# Patient Record
Sex: Male | Born: 1945 | Race: White | Hispanic: No | Marital: Married | Smoking: Former smoker
Health system: Southern US, Community
[De-identification: ages and names within clinical notes are randomized; demographics above are authoritative.]

## PROBLEM LIST (undated history)

## (undated) DIAGNOSIS — M1990 Unspecified osteoarthritis, unspecified site: Secondary | ICD-10-CM

## (undated) DIAGNOSIS — G4730 Sleep apnea, unspecified: Secondary | ICD-10-CM

## (undated) DIAGNOSIS — I499 Cardiac arrhythmia, unspecified: Secondary | ICD-10-CM

## (undated) DIAGNOSIS — D75839 Thrombocytosis, unspecified: Secondary | ICD-10-CM

## (undated) DIAGNOSIS — E785 Hyperlipidemia, unspecified: Secondary | ICD-10-CM

## (undated) DIAGNOSIS — T7840XA Allergy, unspecified, initial encounter: Secondary | ICD-10-CM

## (undated) DIAGNOSIS — R7302 Impaired glucose tolerance (oral): Secondary | ICD-10-CM

## (undated) DIAGNOSIS — E119 Type 2 diabetes mellitus without complications: Secondary | ICD-10-CM

## (undated) DIAGNOSIS — E669 Obesity, unspecified: Secondary | ICD-10-CM

## (undated) DIAGNOSIS — L570 Actinic keratosis: Secondary | ICD-10-CM

## (undated) DIAGNOSIS — D473 Essential (hemorrhagic) thrombocythemia: Secondary | ICD-10-CM

## (undated) HISTORY — DX: Thrombocytosis, unspecified: D75.839

## (undated) HISTORY — PX: NASAL SEPTUM SURGERY: SHX37

## (undated) HISTORY — PX: LITHOTRIPSY: SUR834

## (undated) HISTORY — DX: Essential (hemorrhagic) thrombocythemia: D47.3

## (undated) HISTORY — DX: Sleep apnea, unspecified: G47.30

## (undated) HISTORY — DX: Obesity, unspecified: E66.9

## (undated) HISTORY — DX: Type 2 diabetes mellitus without complications: E11.9

## (undated) HISTORY — DX: Impaired glucose tolerance (oral): R73.02

## (undated) HISTORY — PX: EYE SURGERY: SHX253

## (undated) HISTORY — DX: Hyperlipidemia, unspecified: E78.5

## (undated) HISTORY — PX: CATARACT EXTRACTION, BILATERAL: SHX1313

## (undated) HISTORY — PX: TONSILLECTOMY: SUR1361

## (undated) HISTORY — DX: Allergy, unspecified, initial encounter: T78.40XA

## (undated) HISTORY — DX: Actinic keratosis: L57.0

---

## 2005-06-25 ENCOUNTER — Emergency Department: Payer: Self-pay | Admitting: Emergency Medicine

## 2005-06-27 ENCOUNTER — Ambulatory Visit: Payer: Self-pay | Admitting: Urology

## 2006-07-14 ENCOUNTER — Ambulatory Visit: Payer: Self-pay | Admitting: Urology

## 2006-12-16 ENCOUNTER — Ambulatory Visit: Payer: Self-pay | Admitting: Otolaryngology

## 2006-12-22 ENCOUNTER — Ambulatory Visit: Payer: Self-pay | Admitting: Unknown Physician Specialty

## 2007-01-20 ENCOUNTER — Ambulatory Visit: Payer: Self-pay | Admitting: Otolaryngology

## 2007-04-09 ENCOUNTER — Ambulatory Visit: Payer: Self-pay | Admitting: Internal Medicine

## 2007-04-16 ENCOUNTER — Ambulatory Visit: Payer: Self-pay

## 2007-05-25 ENCOUNTER — Ambulatory Visit: Payer: Self-pay | Admitting: Urology

## 2007-05-27 ENCOUNTER — Ambulatory Visit: Payer: Self-pay | Admitting: Internal Medicine

## 2007-08-11 ENCOUNTER — Ambulatory Visit: Payer: Self-pay | Admitting: Ophthalmology

## 2007-08-11 ENCOUNTER — Other Ambulatory Visit: Payer: Self-pay

## 2007-08-12 ENCOUNTER — Ambulatory Visit: Payer: Self-pay | Admitting: Internal Medicine

## 2007-08-12 LAB — CONVERTED CEMR LAB: Free T4: 0.7 ng/dL (ref 0.6–1.6)

## 2007-09-15 ENCOUNTER — Ambulatory Visit: Payer: Self-pay | Admitting: Ophthalmology

## 2007-12-10 DIAGNOSIS — E119 Type 2 diabetes mellitus without complications: Secondary | ICD-10-CM

## 2007-12-10 HISTORY — DX: Type 2 diabetes mellitus without complications: E11.9

## 2008-08-29 ENCOUNTER — Ambulatory Visit: Payer: Self-pay | Admitting: Urology

## 2009-07-31 ENCOUNTER — Ambulatory Visit: Payer: Self-pay | Admitting: Urology

## 2010-01-18 ENCOUNTER — Encounter: Payer: Self-pay | Admitting: Cardiovascular Disease

## 2010-04-19 ENCOUNTER — Encounter: Payer: Self-pay | Admitting: Cardiovascular Disease

## 2010-05-25 ENCOUNTER — Ambulatory Visit: Payer: Self-pay | Admitting: Cardiovascular Disease

## 2010-05-27 DIAGNOSIS — I4949 Other premature depolarization: Secondary | ICD-10-CM | POA: Insufficient documentation

## 2010-05-27 DIAGNOSIS — R001 Bradycardia, unspecified: Secondary | ICD-10-CM | POA: Insufficient documentation

## 2010-05-27 DIAGNOSIS — I471 Supraventricular tachycardia: Secondary | ICD-10-CM | POA: Insufficient documentation

## 2010-07-10 ENCOUNTER — Emergency Department: Payer: Self-pay | Admitting: Unknown Physician Specialty

## 2010-07-12 ENCOUNTER — Ambulatory Visit: Payer: Self-pay | Admitting: Urology

## 2010-07-19 ENCOUNTER — Ambulatory Visit: Payer: Self-pay | Admitting: Urology

## 2010-07-30 ENCOUNTER — Ambulatory Visit: Payer: Self-pay | Admitting: Urology

## 2010-08-20 ENCOUNTER — Ambulatory Visit: Payer: Self-pay | Admitting: Urology

## 2011-01-08 NOTE — Procedures (Signed)
Summary: Holter and Event  Holter and Event   Imported By: Harlon Flor 05/30/2010 08:33:52  _____________________________________________________________________  External Attachment:    Type:   Image     Comment:   External Document

## 2011-01-08 NOTE — Assessment & Plan Note (Signed)
Summary: HOLTER MONITOR IRREG.   Visit Type:  Initial Consult Primary Provider:  Wonda Cheng  CC:  HolterMonitor Irregular.  History of Present Illness: Richard Hardy is a pleasant, 65 year old male patient of Dr. Marguerite Hardy, with a history of obesity, hyperlipidemia, glucose intolerance, obstructive sleep apnea on CPAP and a strong family history of coronary artery disease who returns for routine followup. 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   overall he states that he has been doing well. He denies any significant lightheadedness or dizziness. He does report occasional heart fluttering and he had a Holter monitor for 24 hours and presents today to discuss the results.  His monitor shows baseline heart rate at rest in the 50s, with exertion typically 60 to 90 beats per minute.there are frequent PVCs. Frequent PACs. He is not symptomatic with this ectopy. He also has short runs SVT, total of 14 runs a longest of which was 67 beats at a rate of 125 beats per minute. Again he is not symptomatic with this supraventricular tachycardia.   Myoview in 2009 which showed an EF of 58% with no evidence of ischemia or infarct.    ABIs which were normal at 1.3 on the right and 1.2 on the left.     Current Medications (verified): 1)  Simvastatin 40 Mg Tabs (Simvastatin) .... Take One Tablet By Mouth Daily At Bedtime 2)  Flonase 50 Mcg/act Susp (Fluticasone Propionate) .... Before Bed 3)  Juice Plus Fibre  Liqd (Nutritional Supplements) .... 2 Tabs Two Times A Day 4)  Aspirin 81 Mg  Tabs (Aspirin) .... Take 1 Tablet By Mouth Once A Day 5)  Glucosamine-Chondroitin  Caps (Glucosamine-Chondroit-Vit C-Mn) .... Take 2 Tablet By Mouth Once A Day 6)  Flax   Oil (Flaxseed (Linseed)) .... Take 1 Tablet By Mouth Once A Day 7)  Fish Oil   Oil (Fish Oil) .... Take 1 Tablet By Mouth Once A Day 8)  Zyrtec-D Allergy & Congestion 5-120 Mg Xr12h-Tab (Cetirizine-Pseudoephedrine) .... Take 1 Tablet By Mouth Once A  Day As Needed For Allergy Symptoms 9)  Eql Coq10 200 Mg Caps (Coenzyme Q10) .... Take 1 Tablet By Mouth Once A Day 10)  Prevacid 24hr 15 Mg Cpdr (Lansoprazole) .... Take 1 Tablet By Mouth Once A Day As Need For Indigestion. 11)  Bi Pap .... At Bedtime  Allergies (verified): 1)  ! Sulfa  Past History:  Past Medical History: Last updated: 11/01/2009  1. Obesity.  2. Glucose intolerance.  3. Hyperlipidemia.  4. Sleep apnea on BiPAP.  Family History: Last updated: 09-06-2009  Mother died at age 31 from cancer.  Father died at 32   from a heart attack.  He has one brother who just underwent bypass  surgery.      Social History: Last updated: 09/06/09  He is married with four children.  Lives in [place].   Had a history of tobacco 1/2 pack per day x4 years.  Quit in 1965.  Very  occasional alcohol.   Review of Systems  The patient denies fever, weight loss, weight gain, vision loss, decreased hearing, hoarseness, chest pain, syncope, dyspnea on exertion, peripheral edema, prolonged cough, abdominal pain, incontinence, muscle weakness, depression, and enlarged lymph nodes.         Palpitations, fluttering  Vital Signs:  Patient profile:   65 year old male Height:      70 inches Weight:      224 pounds BMI:     32.26 Pulse rate:  53 / minute BP sitting:   124 / 72  (right arm) Cuff size:   regular  Physical Exam  General:  Well developed, well nourished, in no acute distress. Head:  normocephalic and atraumatic Neck:  Neck supple, no JVD. No masses, thyromegaly or abnormal cervical nodes. Lungs:  Clear bilaterally to auscultation and percussion. Heart:  Non-displaced PMI, chest non-tender; regular rate and rhythm, S1, S2 without murmurs, rubs or gallops. Carotid upstroke normal, no bruit.  Pedals normal pulses. No edema, no varicosities. Abdomen:  Bowel sounds positive; abdomen soft and non-tender without masses Msk:  Back normal, normal gait. Muscle strength and  tone normal. Pulses:  pulses normal in all 4 extremities Extremities:  No clubbing or cyanosis. Neurologic:  Alert and oriented x 3. Skin:  Intact without lesions or rashes. Psych:  Normal affect.    EKG  Procedure date:  05/25/2010  Findings:      normal sinus rhythm with rate 53 beats per minute, no significant ST or T wave changes.  Impression & Recommendations:  Problem # 1:  BRADYCARDIA (ICD-427.89) the Holter shows bradycardia, PVCs, APCs and short runs of SVT. As he is asymptomatic, we will continue to monitor him for now. We cannot use rate controlling agents for his tachycardia arrhythmias due to his bradycardia. If he begins to have symptoms of dizziness or lightheadedness, we could perform a longer event monitor to determine if he does in fact have sick sinus syndrome. If symptoms are documented and pronounced, a pacemaker could be placed.  We'll see him back in one year's time or earlier if he has worsening symptoms. We'll try to get his most recent cholesterol from Dr. Marguerite Hardy.   His updated medication list for this problem includes:    Aspirin 81 Mg Tabs (Aspirin) .Marland Kitchen... Take 1 tablet by mouth once a day

## 2011-01-08 NOTE — Letter (Signed)
Summary: PHI  PHI   Imported By: Harlon Flor 05/30/2010 08:34:07  _____________________________________________________________________  External Attachment:    Type:   Image     Comment:   External Document

## 2011-04-23 NOTE — Assessment & Plan Note (Signed)
[facility]                            CARDIOLOGY OFFICE NOTE   NAME:Richard Hardy, Richard Hardy                          MRN:          [id number]  DATE:08/12/2007                            DOB:          Jul 14, 1946    INTERVAL HISTORY:  Mr. Bungert is a pleasant, 65 year old male with a  history of obesity, hyperlipidemia, glucose intolerance, obstructive  sleep apnea on CPAP and a strong family history of coronary artery  disease who returns for routine followup.   Since we saw him before, he underwent a Myoview which showed an EF of  58% with no evidence of ischemia or infarct.  He also had ABIs which  were normal at 1.3 on the right and 1.2 on the left.  Yesterday, he  underwent cataract surgery and after surgery, while they were taking the  bandages off, he became bradycardic and clammy with heart rates down in  the 40s.  He says this also happens when he has blood drawn.  Today, he  feels much better with heart rates in the 50s, but he is still concerned  about the episode somewhat.   At baseline, he is quite active without any chest pain or dyspnea.   CURRENT MEDICATIONS:  1. Aspirin 81 mg a day.  2. Simvastatin 40 mg a day.  3. Multivitamin.   PHYSICAL EXAMINATION:  GENERAL:  He is quite muscular-appearing in no  acute distress.  He ambulates around the clinic without any respiratory  difficulty.  VITAL SIGNS:  Blood pressure 114/82, heart rate 56.  HEENT:  Normal.  NECK:  Supple.  There is no JVD.  Carotids are 2+ bilaterally without  bruits.  There is no lymphadenopathy or thyromegaly.  CARDIAC:  2+ bilaterally without bruits.  There is no lymphadenopathy or  thyromegaly.  PMI nondisplaced.  Bradycardic and regular.  No murmurs,  rubs or gallops.  LUNGS:  Clear.  ABDOMEN:  Soft, nontender, nondistended, no hepatosplenomegaly, no  bruits, no masses.  Good bowel sounds.  EXTREMITIES:  Warm with no cyanosis, clubbing or edema.  No rash.  NEUROLOGIC:   He is alert and oriented x3.  Cranial nerves 2-12 grossly  intact.  He moves all four extremities without difficulty.  Affect is  very pleasant.   LABORATORY DATA AND X-RAY FINDINGS:  Creatinine 0.9.  TSH normal.  Lipids with total cholesterol 107, triglycerides 60, HDL 31, LDL 64.   ASSESSMENT/PLAN:  1. Bradycardic episode.  I suspect he had a vagal reaction and this is      benign and will not require any further treatment.  2. Cardiovascular risk reduction.  LDL is at goal at less than 70.      His HDL remains a little low.  I encouraged him to continue to      exercise.  I suspect we may need to consider Niaspan at his next      visit.  3. Hypertension, well-controlled.  4. We will see him back in several months for a routine followup.     Bevelyn Buckles. Bensimhon, MD  Electronically Signed  DRB/MedQ  DD: 08/12/2007  DT: 08/13/2007  Job #: [id number]   cc:   Wonda Cheng

## 2011-04-23 NOTE — Assessment & Plan Note (Signed)
[facility] OFFICE NOTE   NAME:Bressman, ELIGE                          MRN:          [id number]  DATE:05/27/2007                            DOB:          15-Dec-1945    PRIMARY CARE PHYSICIAN:  Dr. Wonda Cheng.   INTERVAL HISTORY:  Mr. Tebbetts is a very pleasant 65 year old male with a  history of metabolic syndrome, who returns for followup on his stress  testing.  He has a strong family history of coronary artery disease.   Since we last saw him, he says he has felt much better.  He has much  less chest gurgling, denies any dyspnea.  He has been trying to do a  better job watching his diet and get more exercise.  He underwent  exercise Myoview where he walked 8 minutes on a Bruce protocol with no  chest pain or dyspnea.  EF was 58% with normal perfusion and normal EKG.  He also underwent a low extremity ultrasound and ABIs, which were  totally normal.   CURRENT MEDICATIONS:  1. Simvastatin 40 mg a day.  2. Zyrtec.  3. Aspirin 81 a day.  4. Multivitamin.  5. BiPAP for sleep apnea.   PHYSICAL EXAMINATION:  GENERAL:  He is well-appearing.  He walks around  the clinic without any respiratory difficulty.  VITAL SIGNS:  Blood pressure 116/72, heart rate 60.  Weight is 229 which  is down 6 pounds from previous.  HEENT:  Normal.  NECK:  Supple, no JVD.  Carotids are 2+ bilaterally without any bruits.  There is no lymphadenopathy or thyromegaly.  CARDIAC:  Regular rate and rhythm, a soft S4 but no murmur.  LUNGS:  Clear.  ABDOMEN:  Mildly obese, nontender, nondistended.  No hepatosplenomegaly,  no bruits, no masses.  Good bowel sounds.  EXTREMITIES:  Warm with no cyanosis, clubbing or edema.  SKIN:  No rash.  NEUROLOGIC:  He is alert and oriented x3.  Cranial nerves 2-12 are  intact.  Moves all 4 extremities without difficulty.  Affect is  pleasant.   ASSESSMENT/PLAN:  1. Chest pain.  This is resolved.  His  Myoview looks good.  I told him      that, although there does not appear to be any critical coronary      stenoses, he may still have some non-obstructive plaque and is at      risk for progression of this disease or a plaque rupture.  I      emphasized the need for continued aggressive risk factor      modification including regular exercise, weight loss and tight      control of his lipids.  2. Hypertension, well controlled.  He will keep a blood pressure log      to confirm.  3. Hyperlipidemia, is on Simvastatin and most recently cholesterol LDL      looked fairly good, but HDL was quite low.  Should this continue,      may need to consider niacin.  4. Glucose intolerance.  I have once again  recommended weight loss and      regular exercise.   DISPOSITION:  They will return to see [place] in 3 months for routine  followup.     Bevelyn Buckles. Bensimhon, MD  Electronically Signed    DRB/MedQ  DD: 05/27/2007  DT: 05/27/2007  Job #: [id number]   cc:   Wonda Cheng

## 2011-04-23 NOTE — Assessment & Plan Note (Signed)
[facility] OFFICE NOTE   NAME:Richard Hardy, THORNE                          MRN:          [id number]  DATE:04/09/2007                            DOB:          Jun 01, 1946    REFERRING PHYSICIAN:  Wonda Cheng   REASON FOR CONSULTATION:  Chest pain and cardiac screening evaluation.   Richard Hardy is a delightful 65 year old with a history of obesity,  hypolipidemia, and glucose intolerance as well as a strong family  history of coronary artery disease who presents today with his wife for  evaluation of chest pain and also for cardiac screening evaluation.  He  denies any history of known coronary disease.  His concern for coronary  disease, however, was heightened as his brother just underwent five  vessel bypass surgery at [facility] yesterday, and his father also died at age  62 from a heart attack.  He has had a stress test about five years ago  which was reportedly normal.   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  However, in the past few years he has had several episodes of  significant chest pressure while lying in bed.  Most recently a few  weeks ago he had an episode after eating some apples.  He got up and  drank some ginger ale and got better.  He also complains of frequent  cramping in his lower extremities.  This is not always exertional, and  he denies frank claudication signs.   REVIEW OF SYSTEMS:  He denies syncope or palpitations.  He has not had  any heart failure symptoms.  No fevers, chills, nausea, vomiting, cough,  bright red blood per rectum or melena.   PAST MEDICAL HISTORY:  1. Obesity.  2. Glucose intolerance.  3. Hyperlipidemia.  4. Sleep apnea on BiPAP.   CURRENT MEDICATIONS:  1. Simvastatin 40 mg.  2. Zyrtec.  3. Aspirin 81.  4. Multivitamin.  5. BiPAP.   ALLERGIES:  SULFA.   SOCIAL HISTORY:  He is married with four children.  Lives in [place].  Had a history of tobacco 1/2 pack per day x4 years.  Quit in 1965.  Very  occasional alcohol.   FAMILY HISTORY:  Mother died at age 32 from cancer.  Father died at 32  from a heart attack.  He has one brother who just underwent bypass  surgery.   PHYSICAL EXAMINATION:  GENERAL:  He is well-appearing in no acute  distress.  Ambulates around the clinic without any respiratory  difficulty.  VITAL SIGNS:  Blood pressure initially was 140/80 on manual recheck,  134/86, heart rate 55, weight 234.  HEENT:  Normal.  Does have creased ear lobes.  NECK:  Supple, no JVD.  Carotids are 2+ bilaterally without any bruits.  There is no lymphadenopathy or thyromegaly.  CARDIAC:  Regular rate and rhythm with no murmurs or rubs. There is a  soft S4.  LUNGS:  Clear.  ABDOMEN:  Notable for central obesity.  Nontender, nondistended.  No  hepatosplenomegaly, no bruits, no masses appreciated.  Good bowel  sounds.  Unable to palpate the abdominal aorta sufficiently to estimate  its size.  EXTREMITIES:  Warm with no cyanosis, clubbing or edema.  No rash.  DP  and PT pulses are 2+ on the right.  On the left the DP is 1 to 2+, and  the PT is faint.  Femoral artery pulses are 2+ bilaterally with no  bruits.  NEUROLOGICAL:  He is alert and oriented x3.  Cranial nerves II-XII are  intact.  Moves all four extremities without difficulty.  Affect is very  pleasant.   EKG shows sinus bradycardia with no significant ST-T wave abnormalities,  rate of 55,  axis and intervals are normal.   Most recent cholesterol panel from September 2007 says total cholesterol  133, triglycerides 67, HDL 31, LDL 88.  LDL was previously 151 prior to  starting Zocor.   ASSESSMENT/PLAN:  1. Chest pain.  This is very atypical, and I suspect it is related to      reflux disease.  However, he does have multiple risk factors for      coronary artery disease  including a very strong family history.  We      had a long talk about the possible options including stress      testing, cardiac catheterization or cardiac CT.  At this point we      have chosen to proceed with exercise Myoview for further risk      stratification.  2. Metabolic syndrome. __________  shows clear evidence of multiple      components of metabolic syndrome.  I told him that this placed him      at elevated risk for development of diabetes and overt heart      disease.  I counseled him on the need to be very aggressive with      his diet and exercise program in an effort to try and lose at least      20-30 pounds.  I also stressed the need for tight control of his      blood pressure and cholesterol.  3. Hyperlipidemia.  He has responded well to Zocor.  Given his      multiple cardiac risk factors, his goal LDL is under 100.  However,      I might even consider trying to take him down to under 70 to give      him more cardiovascular protection.  4. Lower extremity pain.  This is not classic for claudication, but he      does have diminished pulses on the left.  We will proceed with ABI      and lower extremity ultrasound.  5. Hypertension mildly elevated.  I have asked him to follow up with      Dr. Marguerite Olea to consider initiation of an ACE inhibitor or Norvasc.   DISPOSITION:  Will see him back in one month.  We will call him with  results of his stress test which will likely be done next week.     Bevelyn Buckles. Bensimhon, MD     DRB/MedQ  DD: 04/09/2007  DT: 04/09/2007  Job #: [id number]   cc:   Wonda Cheng

## 2011-05-16 ENCOUNTER — Ambulatory Visit: Payer: Self-pay | Admitting: Cardiovascular Disease

## 2011-05-17 ENCOUNTER — Encounter: Payer: Self-pay | Admitting: Cardiovascular Disease

## 2011-05-22 ENCOUNTER — Ambulatory Visit: Payer: Self-pay | Admitting: Cardiovascular Disease

## 2011-05-23 ENCOUNTER — Encounter: Payer: Self-pay | Admitting: Cardiovascular Disease

## 2011-06-05 ENCOUNTER — Encounter: Payer: Self-pay | Admitting: Cardiovascular Disease

## 2011-06-05 ENCOUNTER — Ambulatory Visit (INDEPENDENT_AMBULATORY_CARE_PROVIDER_SITE_OTHER): Payer: Medicare Other | Admitting: Cardiovascular Disease

## 2011-06-05 DIAGNOSIS — E785 Hyperlipidemia, unspecified: Secondary | ICD-10-CM | POA: Insufficient documentation

## 2011-06-05 DIAGNOSIS — I4949 Other premature depolarization: Secondary | ICD-10-CM

## 2011-06-05 DIAGNOSIS — I498 Other specified cardiac arrhythmias: Secondary | ICD-10-CM

## 2011-06-05 DIAGNOSIS — I471 Supraventricular tachycardia: Secondary | ICD-10-CM

## 2011-06-05 DIAGNOSIS — E1169 Type 2 diabetes mellitus with other specified complication: Secondary | ICD-10-CM | POA: Insufficient documentation

## 2011-06-05 DIAGNOSIS — E119 Type 2 diabetes mellitus without complications: Secondary | ICD-10-CM

## 2011-06-05 NOTE — Assessment & Plan Note (Signed)
Asymptomatic bradycardia otherwise he feels well and is active. No further workup needed.

## 2011-06-05 NOTE — Assessment & Plan Note (Signed)
We have given him a dietary diary his diabetes. He seems to be doing well and his weight is decreasing, sugars have been well controlled.

## 2011-06-05 NOTE — Progress Notes (Signed)
   Patient ID: Richard Hardy, male    DOB: 1946-03-22, 65 y.o.   MRN: [id number]  HPI Comments: Richard Hardy is a  65 year old male patient of Dr. Marguerite Olea,  hyperlipidemia, glucose intolerance, obstructive sleep apnea on CPAP, Borderline diabetes,  and a strong family history of coronary artery disease who returns for routine followup. 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     overall he states that he has been doing well. He denies any significant lightheadedness or dizziness.  He was diagnosed with borderline diabetes and was started on metformin. Since that time, he has changed his diet, decreased his weight and reports his sugars have been getting a little bit low. He reports a sugar this morning in the 80s. Otherwise he feels well, continues to work full-time and is very active with walking.   Myoview in 2009 which showed an EF of 58% with no evidence of ischemia or infarct.      ABIs which were normal at 1.3 on the right and 1.2 on the left.    EKG shows normal sinus rhythm with rate 55 beats per minute, no significant ST or T wave changes        Review of Systems  Constitutional: Negative.   HENT: Negative.   Eyes: Negative.   Respiratory: Negative.   Cardiovascular: Negative.   Gastrointestinal: Negative.   Musculoskeletal: Negative.   Skin: Negative.   Neurological: Negative.   Hematological: Negative.   Psychiatric/Behavioral: Negative.   All other systems reviewed and are negative.    BP 120/78  Pulse 55  Ht 5\' 10"  (1.778 m)  Wt 215 lb (97.523 kg)  BMI 30.85 kg/m2   Physical Exam  Nursing note and vitals reviewed. Constitutional: He is oriented to person, place, and time. He appears well-developed and well-nourished.  HENT:  Head: Normocephalic.  Nose: Nose normal.  Mouth/Throat: Oropharynx is clear and moist.  Eyes: Conjunctivae are normal. Pupils are equal, round, and reactive to light.  Neck: Normal range of motion. Neck supple. No JVD present.    Cardiovascular: Normal rate, regular rhythm, S1 normal, S2 normal, normal heart sounds and intact distal pulses.  Exam reveals no gallop and no friction rub.   No murmur heard. Pulmonary/Chest: Effort normal and breath sounds normal. No respiratory distress. He has no wheezes. He has no rales. He exhibits no tenderness.  Abdominal: Soft. Bowel sounds are normal. He exhibits no distension. There is no tenderness.  Musculoskeletal: Normal range of motion. He exhibits no edema and no tenderness.  Lymphadenopathy:    He has no cervical adenopathy.  Neurological: He is alert and oriented to person, place, and time. Coordination normal.  Skin: Skin is warm and dry. No rash noted. No erythema.  Psychiatric: He has a normal mood and affect. His behavior is normal. Judgment and thought content normal.           Assessment and Plan

## 2011-06-05 NOTE — Patient Instructions (Signed)
You are doing well. No medication changes were made. Please call us if you have new issues that need to be addressed before your next appt.  We will call you for a follow up Appt. In 12 months  

## 2011-06-05 NOTE — Assessment & Plan Note (Signed)
In the past, he has had excellent cholesterol control. We'll try to obtain his most recent lipid numbers for our records.

## 2011-12-19 DIAGNOSIS — E119 Type 2 diabetes mellitus without complications: Secondary | ICD-10-CM | POA: Diagnosis not present

## 2011-12-31 DIAGNOSIS — J019 Acute sinusitis, unspecified: Secondary | ICD-10-CM | POA: Diagnosis not present

## 2012-02-03 ENCOUNTER — Ambulatory Visit: Payer: Self-pay | Admitting: Unknown Physician Specialty

## 2012-02-03 DIAGNOSIS — Z79899 Other long term (current) drug therapy: Secondary | ICD-10-CM | POA: Diagnosis not present

## 2012-02-03 DIAGNOSIS — Z800 Family history of malignant neoplasm of digestive organs: Secondary | ICD-10-CM | POA: Diagnosis not present

## 2012-02-03 DIAGNOSIS — D139 Benign neoplasm of ill-defined sites within the digestive system: Secondary | ICD-10-CM | POA: Diagnosis not present

## 2012-02-03 DIAGNOSIS — Z86010 Personal history of colonic polyps: Secondary | ICD-10-CM | POA: Diagnosis not present

## 2012-02-03 DIAGNOSIS — K648 Other hemorrhoids: Secondary | ICD-10-CM | POA: Diagnosis not present

## 2012-02-03 DIAGNOSIS — D126 Benign neoplasm of colon, unspecified: Secondary | ICD-10-CM | POA: Diagnosis not present

## 2012-02-03 DIAGNOSIS — Z1211 Encounter for screening for malignant neoplasm of colon: Secondary | ICD-10-CM | POA: Diagnosis not present

## 2012-02-03 DIAGNOSIS — G4730 Sleep apnea, unspecified: Secondary | ICD-10-CM | POA: Diagnosis not present

## 2012-02-03 DIAGNOSIS — Z87891 Personal history of nicotine dependence: Secondary | ICD-10-CM | POA: Diagnosis not present

## 2012-02-05 DIAGNOSIS — G4733 Obstructive sleep apnea (adult) (pediatric): Secondary | ICD-10-CM | POA: Diagnosis not present

## 2012-02-05 DIAGNOSIS — J301 Allergic rhinitis due to pollen: Secondary | ICD-10-CM | POA: Diagnosis not present

## 2012-02-12 ENCOUNTER — Ambulatory Visit: Payer: Self-pay | Admitting: Otolaryngology

## 2012-02-12 DIAGNOSIS — G4733 Obstructive sleep apnea (adult) (pediatric): Secondary | ICD-10-CM | POA: Diagnosis not present

## 2012-03-01 IMAGING — CR DG ABDOMEN 1V
1 series · 2 of 2 positions shown · non-contrast
Comparison: none

REASON FOR EXAM: NEPHROLITHIASIS
COMMENTS:

[Series 1: view not recorded · 0.17mm/px · 2 of 2 slices shown]
[im 1/2]
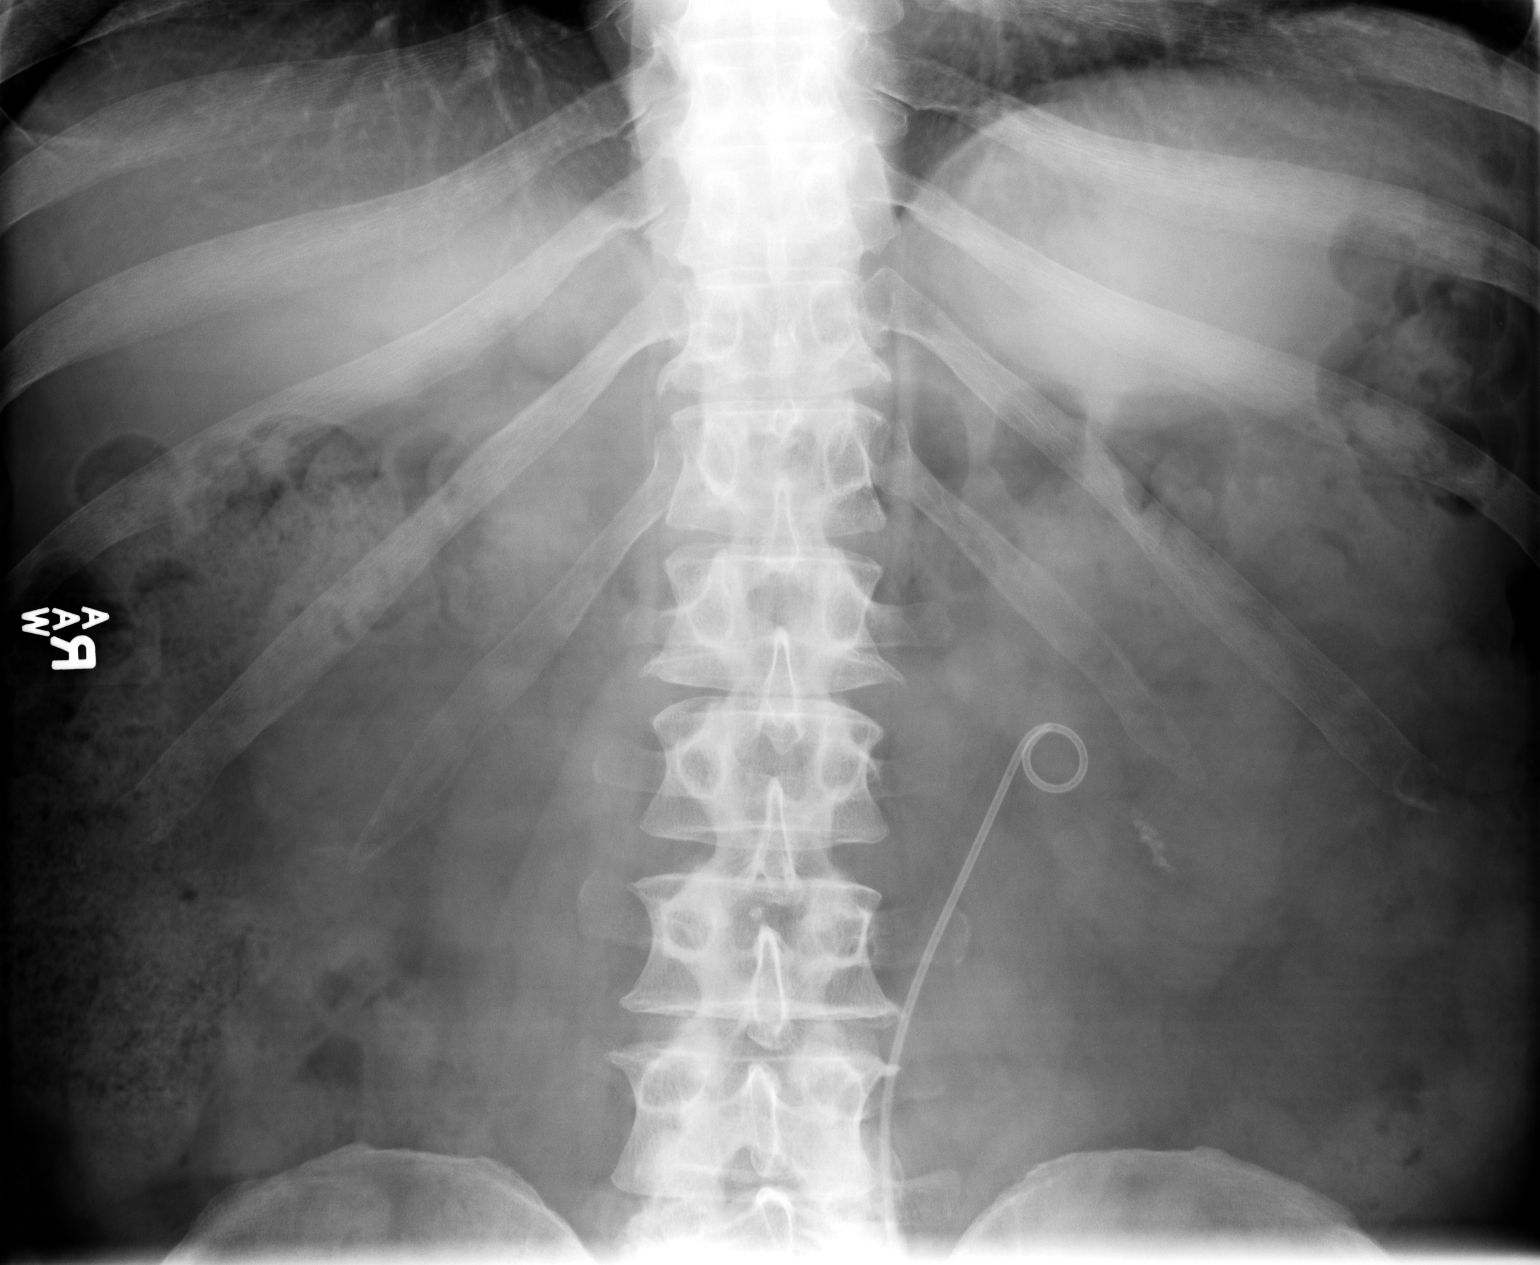
[im 2/2]
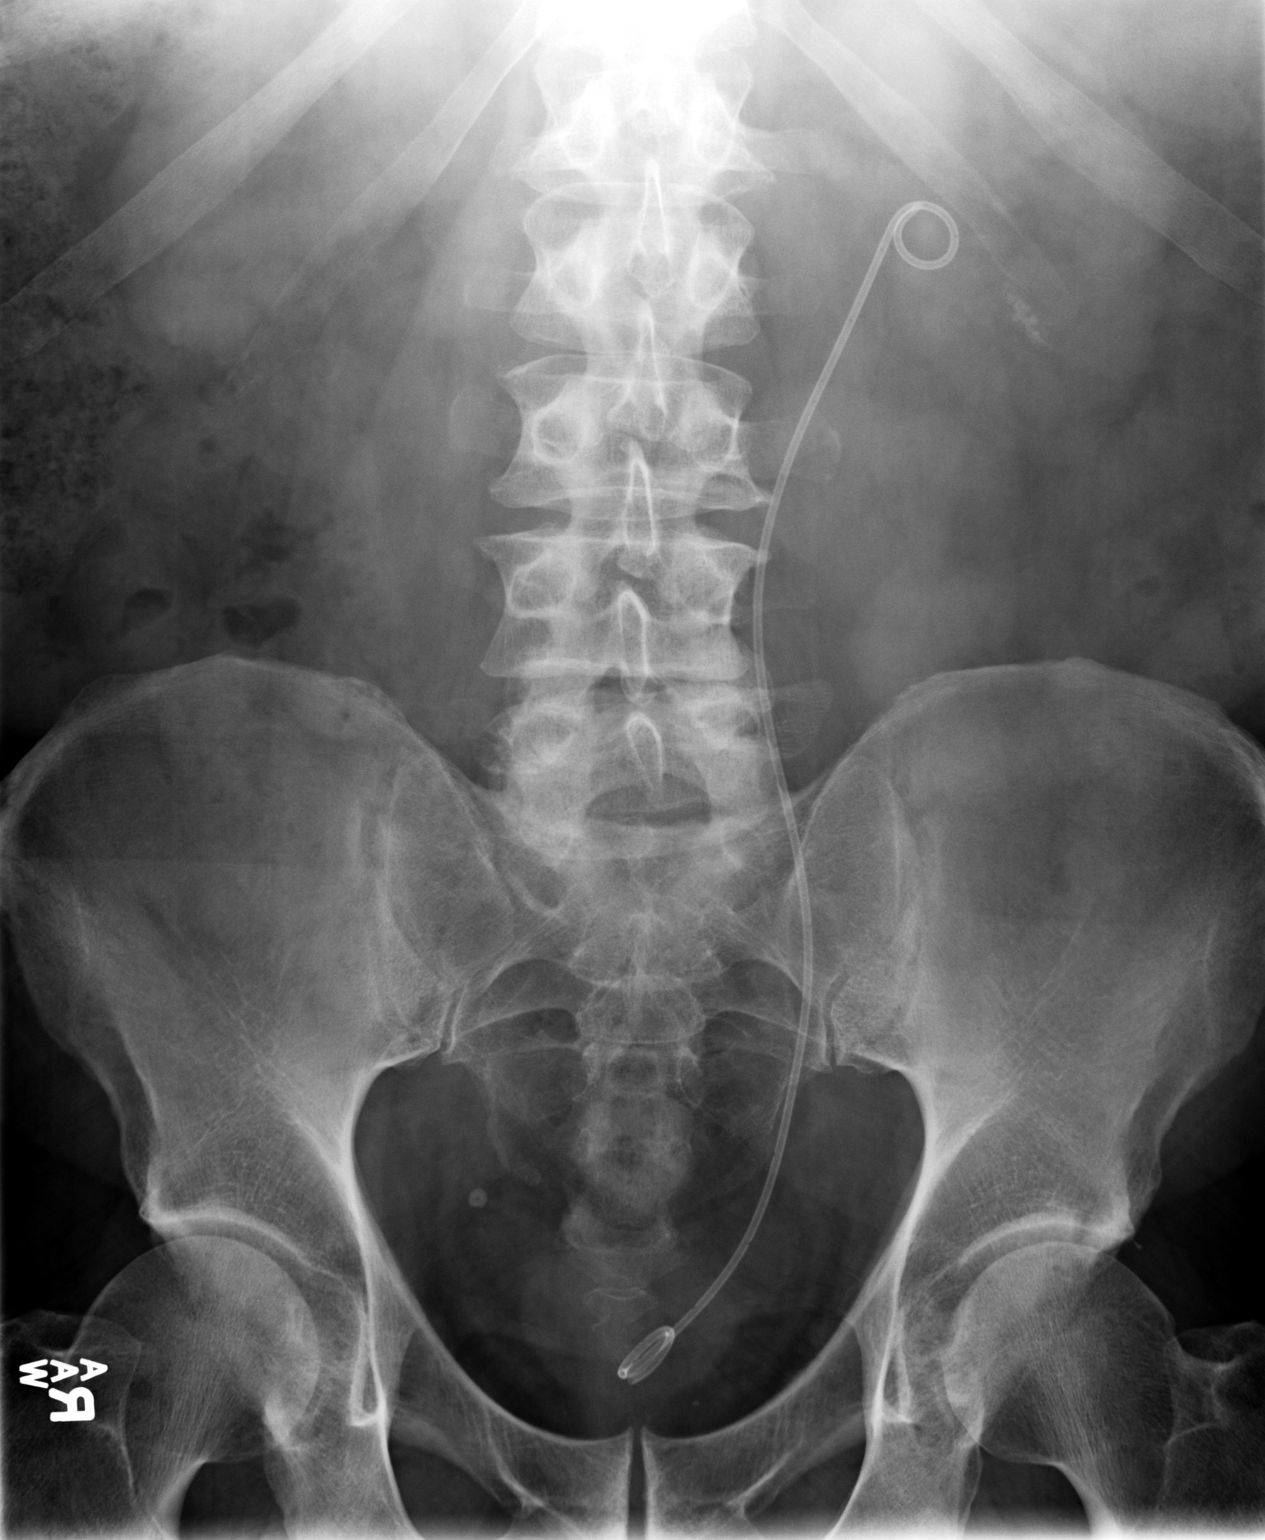

[2 of 2 positions shown; findings below may reference images not displayed]

PROCEDURE:     DXR - DXR KIDNEY URETER BLADDER  - July 30, 2010  [DATE]

RESULT:     Frontal view of the abdomen and pelvis is obtained. Comparison
is made to a prior study dated 07/19/2010.

A left sided, double J stent is once again appreciated. The previously
described calcified density projecting in the lower pole region of the left
renal fossa has a more fragmented appearance on the present study. Multiple,
small densities are now appreciated in this region. This is consistent with
the patient's history of recent lithotripsy. Phleboliths are appreciated in
the region of pelvis on the right. No further areas of abnormal calcified
densities within the right and left urinary collecting system identified.
IMPRESSION: Findings consistent with the patient's history of recent
lithotripsy.

## 2012-03-04 ENCOUNTER — Ambulatory Visit: Payer: Self-pay | Admitting: Otolaryngology

## 2012-03-04 DIAGNOSIS — G4733 Obstructive sleep apnea (adult) (pediatric): Secondary | ICD-10-CM | POA: Diagnosis not present

## 2012-03-11 DIAGNOSIS — J019 Acute sinusitis, unspecified: Secondary | ICD-10-CM | POA: Diagnosis not present

## 2012-03-11 DIAGNOSIS — J309 Allergic rhinitis, unspecified: Secondary | ICD-10-CM | POA: Diagnosis not present

## 2012-03-11 DIAGNOSIS — R109 Unspecified abdominal pain: Secondary | ICD-10-CM | POA: Diagnosis not present

## 2012-03-11 DIAGNOSIS — E7800 Pure hypercholesterolemia, unspecified: Secondary | ICD-10-CM | POA: Diagnosis not present

## 2012-03-11 DIAGNOSIS — E119 Type 2 diabetes mellitus without complications: Secondary | ICD-10-CM | POA: Diagnosis not present

## 2012-03-11 DIAGNOSIS — R1013 Epigastric pain: Secondary | ICD-10-CM | POA: Diagnosis not present

## 2012-03-13 ENCOUNTER — Ambulatory Visit: Payer: Self-pay | Admitting: Family Medicine

## 2012-03-13 DIAGNOSIS — R109 Unspecified abdominal pain: Secondary | ICD-10-CM | POA: Diagnosis not present

## 2012-03-19 DIAGNOSIS — E119 Type 2 diabetes mellitus without complications: Secondary | ICD-10-CM | POA: Diagnosis not present

## 2012-03-19 DIAGNOSIS — E7800 Pure hypercholesterolemia, unspecified: Secondary | ICD-10-CM | POA: Diagnosis not present

## 2012-03-19 DIAGNOSIS — R1013 Epigastric pain: Secondary | ICD-10-CM | POA: Diagnosis not present

## 2012-03-19 DIAGNOSIS — J309 Allergic rhinitis, unspecified: Secondary | ICD-10-CM | POA: Diagnosis not present

## 2012-03-19 DIAGNOSIS — R109 Unspecified abdominal pain: Secondary | ICD-10-CM | POA: Diagnosis not present

## 2012-03-19 DIAGNOSIS — J019 Acute sinusitis, unspecified: Secondary | ICD-10-CM | POA: Diagnosis not present

## 2012-04-09 DIAGNOSIS — D696 Thrombocytopenia, unspecified: Secondary | ICD-10-CM | POA: Diagnosis not present

## 2012-04-09 DIAGNOSIS — E119 Type 2 diabetes mellitus without complications: Secondary | ICD-10-CM | POA: Diagnosis not present

## 2012-04-17 ENCOUNTER — Ambulatory Visit: Payer: Self-pay | Admitting: Oncology

## 2012-04-17 DIAGNOSIS — E119 Type 2 diabetes mellitus without complications: Secondary | ICD-10-CM | POA: Diagnosis not present

## 2012-04-17 DIAGNOSIS — Z79899 Other long term (current) drug therapy: Secondary | ICD-10-CM | POA: Diagnosis not present

## 2012-04-17 DIAGNOSIS — I10 Essential (primary) hypertension: Secondary | ICD-10-CM | POA: Diagnosis not present

## 2012-04-17 DIAGNOSIS — D696 Thrombocytopenia, unspecified: Secondary | ICD-10-CM | POA: Diagnosis not present

## 2012-04-17 LAB — CBC CANCER CENTER
Basophil %: 1.5 %
Eosinophil %: 3.9 %
HGB: 15.2 g/dL (ref 13.0–18.0)
Lymphocyte %: 28.4 %
MCH: 31.3 pg (ref 26.0–34.0)
MCHC: 32.9 g/dL (ref 32.0–36.0)
Monocyte %: 9.5 %
Platelet: 141 x10 3/mm — ABNORMAL LOW (ref 150–440)

## 2012-04-17 LAB — IRON AND TIBC
Iron Bind.Cap.(Total): 362 ug/dL (ref 250–450)
Iron Saturation: 23 %
Iron: 85 ug/dL (ref 65–175)
Unbound Iron-Bind.Cap.: 277 ug/dL

## 2012-04-17 LAB — FOLATE: Folic Acid: 41.7 ng/mL (ref 3.1–100.0)

## 2012-04-17 LAB — LACTATE DEHYDROGENASE: LDH: 223 U/L (ref 87–241)

## 2012-04-27 DIAGNOSIS — G4733 Obstructive sleep apnea (adult) (pediatric): Secondary | ICD-10-CM | POA: Diagnosis not present

## 2012-04-27 DIAGNOSIS — J301 Allergic rhinitis due to pollen: Secondary | ICD-10-CM | POA: Diagnosis not present

## 2012-05-09 ENCOUNTER — Ambulatory Visit: Payer: Self-pay | Admitting: Oncology

## 2012-06-05 ENCOUNTER — Ambulatory Visit (INDEPENDENT_AMBULATORY_CARE_PROVIDER_SITE_OTHER): Payer: Medicare Other | Admitting: Cardiovascular Disease

## 2012-06-05 ENCOUNTER — Encounter: Payer: Self-pay | Admitting: Cardiovascular Disease

## 2012-06-05 VITALS — BP 118/60 | HR 57 | Ht 70.0 in | Wt 216.0 lb

## 2012-06-05 DIAGNOSIS — E119 Type 2 diabetes mellitus without complications: Secondary | ICD-10-CM

## 2012-06-05 DIAGNOSIS — R011 Cardiac murmur, unspecified: Secondary | ICD-10-CM

## 2012-06-05 DIAGNOSIS — E785 Hyperlipidemia, unspecified: Secondary | ICD-10-CM

## 2012-06-05 DIAGNOSIS — I471 Supraventricular tachycardia: Secondary | ICD-10-CM

## 2012-06-05 DIAGNOSIS — I4949 Other premature depolarization: Secondary | ICD-10-CM | POA: Diagnosis not present

## 2012-06-05 DIAGNOSIS — I493 Ventricular premature depolarization: Secondary | ICD-10-CM

## 2012-06-05 DIAGNOSIS — I498 Other specified cardiac arrhythmias: Secondary | ICD-10-CM

## 2012-06-05 NOTE — Progress Notes (Signed)
Patient ID: Richard Hardy, male    DOB: Jul 11, 1946, 66 y.o.   MRN: [id number]  HPI Comments: Richard Hardy is a  66 year old male with a history of  hyperlipidemia, glucose intolerance, obstructive sleep apnea on CPAP, Borderline diabetes,  and a strong family history of coronary artery disease who returns for routine followup. 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       overall Richard Hardy states that Richard Hardy has been doing well. Richard Hardy denies any significant lightheadedness or dizziness.   Richard Hardy reports having significant GI upset on metformin. This was quite severe for him. During this time, workup showed a drop in his platelets from 160, to 132, to 128, now up to 140. This is per his report. Richard Hardy has seen Dr. Orlie Dakin of hematology oncology who is not particularly concerned. Aspirin was held through this. Otherwise Richard Hardy feels well. Richard Hardy does have a new CPAP machine which she finds wonderful.    Myoview in 2009 which showed an EF of 58% with no evidence of ischemia or infarct.        Previous  ABIs which were normal at 1.3 on the right and 1.2 on the left.    EKG shows normal sinus rhythm with rate 57 beats per minute, no significant ST or T wave changes, or overt progression to the anterior precordial leads       Outpatient Encounter Prescriptions as of 06/05/2012  Medication Sig Dispense Refill  . Cholecalciferol (VITAMIN D-3 PO) Take by mouth daily.      . Coenzyme Q10 (COQ10) 200 MG CAPS Take 1 tablet by mouth daily.        . Fish Oil OIL Take by mouth daily.        . fluticasone (FLONASE) 50 MCG/ACT nasal spray Place into the nose at bedtime.        . Glucosamine-Chondroit-Vit C-Mn (GLUCOSAMINE-CHONDROITIN) CAPS Take 2 capsules by mouth daily.        . Misc Natural Products (OSTEO BI-FLEX TRIPLE STRENGTH PO) Take by mouth daily.      . Nutritional Supplements (JUICE PLUS FIBRE) LIQD Take 2 tablets by mouth 2 (two) times daily.        . saxagliptin HCl (ONGLYZA) 5 MG TABS tablet Take by mouth daily.      .  simvastatin (ZOCOR) 40 MG tablet Take 40 mg by mouth at bedtime.        . vitamin C (ASCORBIC ACID) 500 MG tablet Take 500 mg by mouth daily.      . vitamin E (VITAMIN E) 400 UNIT capsule Take 400 Units by mouth daily.      Marland Kitchen aspirin 81 MG tablet Take 81 mg by mouth daily.         Review of Systems  Constitutional: Negative.   HENT: Negative.   Eyes: Negative.   Respiratory: Negative.   Cardiovascular: Negative.   Gastrointestinal: Negative.   Musculoskeletal: Negative.   Skin: Negative.   Neurological: Negative.   Hematological: Negative.   Psychiatric/Behavioral: Negative.   All other systems reviewed and are negative.     BP 118/60  Pulse 57  Ht 5\' 10"  (1.778 m)  Wt 216 lb (97.977 kg)  BMI 30.99 kg/m2  Physical Exam  Nursing note and vitals reviewed. Constitutional: Richard Hardy is oriented to person, place, and time. Richard Hardy appears well-developed and well-nourished.  HENT:  Head: Normocephalic.  Nose: Nose normal.  Mouth/Throat: Oropharynx is clear and moist.  Eyes: Conjunctivae are normal. Pupils are equal,  round, and reactive to light.  Neck: Normal range of motion. Neck supple. No JVD present.  Cardiovascular: Normal rate, regular rhythm, S1 normal, S2 normal, normal heart sounds and intact distal pulses.  Exam reveals no gallop and no friction rub.   No murmur heard. Pulmonary/Chest: Effort normal and breath sounds normal. No respiratory distress. Richard Hardy has no wheezes. Richard Hardy has no rales. Richard Hardy exhibits no tenderness.  Abdominal: Soft. Bowel sounds are normal. Richard Hardy exhibits no distension. There is no tenderness.  Musculoskeletal: Normal range of motion. Richard Hardy exhibits no edema and no tenderness.  Lymphadenopathy:    Richard Hardy has no cervical adenopathy.  Neurological: Richard Hardy is alert and oriented to person, place, and time. Coordination normal.  Skin: Skin is warm and dry. No rash noted. No erythema.  Psychiatric: Richard Hardy has a normal mood and affect. His behavior is normal. Judgment and thought content  normal.           Assessment and Plan

## 2012-06-05 NOTE — Assessment & Plan Note (Addendum)
His diet has significantly improved. He is active. We have encouraged him continue to closely watch his diet. Recent problems on metformin, now on an alternate medication.

## 2012-06-05 NOTE — Patient Instructions (Addendum)
You are doing well. No medication changes were made.  Please call us if you have new issues that need to be addressed before your next appt.  Your physician wants you to follow-up in: 12 months.  You will receive a reminder letter in the mail two months in advance. If you don't receive a letter, please call our office to schedule the follow-up appointment. 

## 2012-06-05 NOTE — Assessment & Plan Note (Signed)
He denies any further arrhythmia and has been feeling well. No changes to his medications.

## 2012-06-05 NOTE — Assessment & Plan Note (Signed)
He continues to be bradycardic but is asymptomatic. No medication changes made.

## 2012-06-05 NOTE — Assessment & Plan Note (Signed)
We have suggested he stay on his statin. In the past, lipids have been well controlled

## 2012-06-29 ENCOUNTER — Other Ambulatory Visit: Payer: Self-pay

## 2012-06-29 ENCOUNTER — Other Ambulatory Visit (INDEPENDENT_AMBULATORY_CARE_PROVIDER_SITE_OTHER): Payer: Medicare Other

## 2012-06-29 DIAGNOSIS — R011 Cardiac murmur, unspecified: Secondary | ICD-10-CM | POA: Diagnosis not present

## 2012-06-29 DIAGNOSIS — I493 Ventricular premature depolarization: Secondary | ICD-10-CM

## 2012-07-17 ENCOUNTER — Ambulatory Visit: Payer: Self-pay | Admitting: Oncology

## 2012-07-17 DIAGNOSIS — I10 Essential (primary) hypertension: Secondary | ICD-10-CM | POA: Diagnosis not present

## 2012-07-17 DIAGNOSIS — E119 Type 2 diabetes mellitus without complications: Secondary | ICD-10-CM | POA: Diagnosis not present

## 2012-07-17 DIAGNOSIS — Z79899 Other long term (current) drug therapy: Secondary | ICD-10-CM | POA: Diagnosis not present

## 2012-07-17 DIAGNOSIS — D696 Thrombocytopenia, unspecified: Secondary | ICD-10-CM | POA: Diagnosis not present

## 2012-07-17 LAB — CBC CANCER CENTER
Basophil #: 0 x10 3/mm (ref 0.0–0.1)
Eosinophil #: 0.1 x10 3/mm (ref 0.0–0.7)
HCT: 45.6 % (ref 40.0–52.0)
Lymphocyte #: 1.6 x10 3/mm (ref 1.0–3.6)
Lymphocyte %: 33 %
MCH: 32.5 pg (ref 26.0–34.0)
MCHC: 34 g/dL (ref 32.0–36.0)
MCV: 96 fL (ref 80–100)
Monocyte #: 0.6 x10 3/mm (ref 0.2–1.0)
Monocyte %: 12.1 %
Neutrophil #: 2.5 x10 3/mm (ref 1.4–6.5)
Neutrophil %: 51.1 %
Platelet: 130 x10 3/mm — ABNORMAL LOW (ref 150–440)
RDW: 13.5 % (ref 11.5–14.5)

## 2012-07-21 DIAGNOSIS — E119 Type 2 diabetes mellitus without complications: Secondary | ICD-10-CM | POA: Diagnosis not present

## 2012-07-21 DIAGNOSIS — R5381 Other malaise: Secondary | ICD-10-CM | POA: Diagnosis not present

## 2012-07-21 DIAGNOSIS — E7800 Pure hypercholesterolemia, unspecified: Secondary | ICD-10-CM | POA: Diagnosis not present

## 2012-07-21 DIAGNOSIS — Z23 Encounter for immunization: Secondary | ICD-10-CM | POA: Diagnosis not present

## 2012-07-21 DIAGNOSIS — R5383 Other fatigue: Secondary | ICD-10-CM | POA: Diagnosis not present

## 2012-08-09 ENCOUNTER — Ambulatory Visit: Payer: Self-pay | Admitting: Oncology

## 2012-09-22 DIAGNOSIS — L820 Inflamed seborrheic keratosis: Secondary | ICD-10-CM | POA: Diagnosis not present

## 2012-09-22 DIAGNOSIS — L708 Other acne: Secondary | ICD-10-CM | POA: Diagnosis not present

## 2012-09-22 DIAGNOSIS — D239 Other benign neoplasm of skin, unspecified: Secondary | ICD-10-CM | POA: Diagnosis not present

## 2012-09-22 DIAGNOSIS — L570 Actinic keratosis: Secondary | ICD-10-CM | POA: Diagnosis not present

## 2012-09-22 DIAGNOSIS — L821 Other seborrheic keratosis: Secondary | ICD-10-CM | POA: Diagnosis not present

## 2012-09-22 DIAGNOSIS — L578 Other skin changes due to chronic exposure to nonionizing radiation: Secondary | ICD-10-CM | POA: Diagnosis not present

## 2012-10-08 DIAGNOSIS — E119 Type 2 diabetes mellitus without complications: Secondary | ICD-10-CM | POA: Diagnosis not present

## 2012-10-12 DIAGNOSIS — Z23 Encounter for immunization: Secondary | ICD-10-CM | POA: Diagnosis not present

## 2012-10-12 DIAGNOSIS — D179 Benign lipomatous neoplasm, unspecified: Secondary | ICD-10-CM | POA: Diagnosis not present

## 2012-10-23 ENCOUNTER — Ambulatory Visit: Payer: Self-pay | Admitting: Oncology

## 2012-10-23 DIAGNOSIS — D696 Thrombocytopenia, unspecified: Secondary | ICD-10-CM | POA: Diagnosis not present

## 2012-10-23 DIAGNOSIS — E119 Type 2 diabetes mellitus without complications: Secondary | ICD-10-CM | POA: Diagnosis not present

## 2012-10-23 DIAGNOSIS — Z79899 Other long term (current) drug therapy: Secondary | ICD-10-CM | POA: Diagnosis not present

## 2012-10-23 DIAGNOSIS — I10 Essential (primary) hypertension: Secondary | ICD-10-CM | POA: Diagnosis not present

## 2012-10-23 LAB — CBC CANCER CENTER
Basophil %: 1.4 %
Eosinophil %: 2.6 %
HCT: 45.6 % (ref 40.0–52.0)
HGB: 15 g/dL (ref 13.0–18.0)
Lymphocyte %: 31 %
MCHC: 32.9 g/dL (ref 32.0–36.0)
MCV: 96 fL (ref 80–100)
Monocyte %: 6.3 %
Neutrophil #: 2.7 x10 3/mm (ref 1.4–6.5)
Neutrophil %: 58.7 %
WBC: 4.6 x10 3/mm (ref 3.8–10.6)

## 2012-11-08 ENCOUNTER — Ambulatory Visit: Payer: Self-pay | Admitting: Oncology

## 2013-01-09 ENCOUNTER — Ambulatory Visit: Payer: Self-pay | Admitting: Oncology

## 2013-01-09 DIAGNOSIS — I10 Essential (primary) hypertension: Secondary | ICD-10-CM | POA: Diagnosis not present

## 2013-01-09 DIAGNOSIS — D696 Thrombocytopenia, unspecified: Secondary | ICD-10-CM | POA: Diagnosis not present

## 2013-01-09 DIAGNOSIS — Z79899 Other long term (current) drug therapy: Secondary | ICD-10-CM | POA: Diagnosis not present

## 2013-01-09 DIAGNOSIS — E119 Type 2 diabetes mellitus without complications: Secondary | ICD-10-CM | POA: Diagnosis not present

## 2013-01-15 DIAGNOSIS — D696 Thrombocytopenia, unspecified: Secondary | ICD-10-CM | POA: Diagnosis not present

## 2013-01-15 LAB — CBC CANCER CENTER
Basophil #: 0.1 x10 3/mm (ref 0.0–0.1)
Eosinophil %: 4.1 %
HCT: 46.4 % (ref 40.0–52.0)
MCH: 32.2 pg (ref 26.0–34.0)
MCHC: 34.6 g/dL (ref 32.0–36.0)
MCV: 93 fL (ref 80–100)
Neutrophil %: 49.1 %
Platelet: 133 x10 3/mm — ABNORMAL LOW (ref 150–440)
RDW: 13.5 % (ref 11.5–14.5)

## 2013-01-18 DIAGNOSIS — R5381 Other malaise: Secondary | ICD-10-CM | POA: Diagnosis not present

## 2013-01-18 DIAGNOSIS — R5383 Other fatigue: Secondary | ICD-10-CM | POA: Diagnosis not present

## 2013-01-18 DIAGNOSIS — I10 Essential (primary) hypertension: Secondary | ICD-10-CM | POA: Diagnosis not present

## 2013-01-18 DIAGNOSIS — N320 Bladder-neck obstruction: Secondary | ICD-10-CM | POA: Diagnosis not present

## 2013-01-18 DIAGNOSIS — E119 Type 2 diabetes mellitus without complications: Secondary | ICD-10-CM | POA: Diagnosis not present

## 2013-01-18 DIAGNOSIS — R350 Frequency of micturition: Secondary | ICD-10-CM | POA: Diagnosis not present

## 2013-01-18 DIAGNOSIS — E7800 Pure hypercholesterolemia, unspecified: Secondary | ICD-10-CM | POA: Diagnosis not present

## 2013-02-06 ENCOUNTER — Ambulatory Visit: Payer: Self-pay | Admitting: Oncology

## 2013-04-07 DIAGNOSIS — E119 Type 2 diabetes mellitus without complications: Secondary | ICD-10-CM | POA: Diagnosis not present

## 2013-06-03 ENCOUNTER — Ambulatory Visit (INDEPENDENT_AMBULATORY_CARE_PROVIDER_SITE_OTHER): Payer: Medicare Other | Admitting: Cardiovascular Disease

## 2013-06-03 ENCOUNTER — Encounter: Payer: Self-pay | Admitting: Cardiovascular Disease

## 2013-06-03 VITALS — BP 120/82 | HR 56 | Ht 70.0 in | Wt 224.0 lb

## 2013-06-03 DIAGNOSIS — I4949 Other premature depolarization: Secondary | ICD-10-CM

## 2013-06-03 DIAGNOSIS — I471 Supraventricular tachycardia, unspecified: Secondary | ICD-10-CM

## 2013-06-03 DIAGNOSIS — E119 Type 2 diabetes mellitus without complications: Secondary | ICD-10-CM | POA: Diagnosis not present

## 2013-06-03 DIAGNOSIS — E785 Hyperlipidemia, unspecified: Secondary | ICD-10-CM

## 2013-06-03 NOTE — Assessment & Plan Note (Signed)
Well-controlled. Encouraged him to continue exercise

## 2013-06-03 NOTE — Progress Notes (Signed)
Patient ID: Richard Hardy, male    DOB: [birth year], 67 y.o.   MRN: [id number]  HPI Comments: Richard Hardy is a  67 year old male with a history of  hyperlipidemia, glucose intolerance, obstructive sleep apnea on CPAP, Borderline diabetes,  and a strong family history of coronary artery disease who returns for routine followup. 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       overall Richard Hardy states that Richard Hardy has been doing well. Richard Hardy denies any significant lightheadedness or dizziness. Richard Hardy did have significant allergies this year but has been taking Allegra with good effect. Palpitations have improved.    Previously had significant GI upset on metformin.  workup also showed a drop in his platelets from 160, to 132, to 128, most recently 160.   seen Dr. Orlie Dakin of hematology oncology who is not particularly concerned.  Aspirin was held through this. Otherwise Richard Hardy feels well. Richard Hardy does have a new CPAP machine.    Myoview in 2009 which showed an EF of 58% with no evidence of ischemia or infarct.     Previous  ABIs which were normal at 1.3 on the right and 1.2 on the left.    Total cholesterol 106, LDL 62 HDL 34, hemoglobin N8G 6.4 EKG shows normal sinus rhythm with rate 56 beats per minute, nonspecific ST abnormality in lead 3 and aVF      Outpatient Encounter Prescriptions as of 06/03/2013  Medication Sig Dispense Refill  . Coenzyme Q10 (COQ10) 200 MG CAPS Take 1 tablet by mouth daily.        . fexofenadine-pseudoephedrine (ALLEGRA-D 24 HOUR) 180-240 MG per 24 hr tablet Take 1 tablet by mouth daily.      . Fish Oil OIL Take by mouth daily.        . fluticasone (FLONASE) 50 MCG/ACT nasal spray Place into the nose at bedtime.        . Glucosamine-Chondroit-Vit C-Mn (GLUCOSAMINE-CHONDROITIN) CAPS Take 2 capsules by mouth daily.        . Misc Natural Products (OSTEO BI-FLEX TRIPLE STRENGTH PO) Take by mouth daily.      . Nutritional Supplements (JUICE PLUS FIBRE) LIQD Take 2 tablets by mouth 2 (two) times  daily.        . saxagliptin HCl (ONGLYZA) 5 MG TABS tablet Take by mouth daily.      . simvastatin (ZOCOR) 40 MG tablet Take 40 mg by mouth at bedtime.        . vitamin C (ASCORBIC ACID) 500 MG tablet Take 500 mg by mouth daily.      . vitamin E (VITAMIN E) 400 UNIT capsule Take 400 Units by mouth daily.       Review of Systems  Constitutional: Negative.   HENT: Negative.   Eyes: Negative.   Respiratory: Negative.   Cardiovascular: Negative.   Gastrointestinal: Negative.   Musculoskeletal: Negative.   Skin: Negative.   Neurological: Negative.   Psychiatric/Behavioral: Negative.   All other systems reviewed and are negative.    BP 120/82  Pulse 56  Ht 5\' 10"  (1.778 m)  Wt 224 lb (101.606 kg)  BMI 32.14 kg/m2  Physical Exam  Nursing note and vitals reviewed. Constitutional: Richard Hardy is oriented to person, place, and time. Richard Hardy appears well-developed and well-nourished.  HENT:  Head: Normocephalic.  Nose: Nose normal.  Mouth/Throat: Oropharynx is clear and moist.  Eyes: Conjunctivae are normal. Pupils are equal, round, and reactive to light.  Neck: Normal range of motion. Neck supple. No  JVD present.  Cardiovascular: Normal rate, regular rhythm, S1 normal, S2 normal, normal heart sounds and intact distal pulses.  Exam reveals no gallop and no friction rub.   No murmur heard. Pulmonary/Chest: Effort normal and breath sounds normal. No respiratory distress. Richard Hardy has no wheezes. Richard Hardy has no rales. Richard Hardy exhibits no tenderness.  Abdominal: Soft. Bowel sounds are normal. Richard Hardy exhibits no distension. There is no tenderness.  Musculoskeletal: Normal range of motion. Richard Hardy exhibits no edema and no tenderness.  Lymphadenopathy:    Richard Hardy has no cervical adenopathy.  Neurological: Richard Hardy is alert and oriented to person, place, and time. Coordination normal.  Skin: Skin is warm and dry. No rash noted. No erythema.  Psychiatric: Richard Hardy has a normal mood and affect. His behavior is normal. Judgment and thought content  normal.      Assessment and Plan

## 2013-06-03 NOTE — Assessment & Plan Note (Signed)
To further symptoms of significant arrhythmia noted.

## 2013-06-03 NOTE — Patient Instructions (Addendum)
You are doing well. No medication changes were made.  Please call us if you have new issues that need to be addressed before your next appt.  Your physician wants you to follow-up in: 12 months.  You will receive a reminder letter in the mail two months in advance. If you don't receive a letter, please call our office to schedule the follow-up appointment. 

## 2013-06-03 NOTE — Assessment & Plan Note (Signed)
Cholesterol is at goal on the current lipid regimen. No changes to the medications were made.  

## 2013-06-15 ENCOUNTER — Encounter: Payer: Self-pay | Admitting: *Deleted

## 2013-07-21 ENCOUNTER — Ambulatory Visit (INDEPENDENT_AMBULATORY_CARE_PROVIDER_SITE_OTHER): Payer: Medicare Other | Admitting: Internal Medicine

## 2013-07-21 ENCOUNTER — Encounter: Payer: Self-pay | Admitting: Internal Medicine

## 2013-07-21 VITALS — BP 144/94 | HR 56 | Temp 98.7°F | Ht 69.0 in | Wt 221.0 lb

## 2013-07-21 DIAGNOSIS — E785 Hyperlipidemia, unspecified: Secondary | ICD-10-CM

## 2013-07-21 DIAGNOSIS — R030 Elevated blood-pressure reading, without diagnosis of hypertension: Secondary | ICD-10-CM | POA: Diagnosis not present

## 2013-07-21 DIAGNOSIS — D696 Thrombocytopenia, unspecified: Secondary | ICD-10-CM

## 2013-07-21 DIAGNOSIS — E119 Type 2 diabetes mellitus without complications: Secondary | ICD-10-CM

## 2013-07-21 DIAGNOSIS — I10 Essential (primary) hypertension: Secondary | ICD-10-CM | POA: Insufficient documentation

## 2013-07-21 DIAGNOSIS — IMO0001 Reserved for inherently not codable concepts without codable children: Secondary | ICD-10-CM

## 2013-07-21 MED ORDER — SAXAGLIPTIN HCL 5 MG PO TABS: 5.0000 mg | ORAL_TABLET | Freq: Every day | ORAL | Status: DC

## 2013-07-21 MED ORDER — SIMVASTATIN 40 MG PO TABS: 40.0000 mg | ORAL_TABLET | Freq: Every day | ORAL | Status: DC

## 2013-07-21 NOTE — Assessment & Plan Note (Signed)
Will check lipids and LFTs with labs. 

## 2013-07-21 NOTE — Progress Notes (Signed)
Subjective:    Patient ID: Richard Hardy, male    DOB: September 15, 1946, 67 y.o.   MRN: [id number]  HPI 67 year old male with history of diabetes, hyperlipidemia, obstructive sleep apnea presents to establish care. He reports he is generally feeling well. He is very active, working full-time with a [employer]. He reports he walks all throughout the day. He also exercises at the [facility] with his wife in the evenings. He reports his blood sugars are well controlled and brings his meter today showing blood sugars typically near 100. He is compliant with medication. He wears BiPAP at night. He denies any fatigue or daytime somnolence. He denies any concerns today.  Outpatient Encounter Prescriptions as of 07/21/2013  Medication Sig Dispense Refill  . Coenzyme Q10 (COQ10) 200 MG CAPS Take 1 tablet by mouth daily.        . fexofenadine-pseudoephedrine (ALLEGRA-D 24 HOUR) 180-240 MG per 24 hr tablet Take 1 tablet by mouth daily.      . Fish Oil OIL Take by mouth daily.        . fluticasone (FLONASE) 50 MCG/ACT nasal spray Place into the nose at bedtime.        . Misc Natural Products (OSTEO BI-FLEX TRIPLE STRENGTH PO) Take by mouth daily.      . Nutritional Supplements (JUICE PLUS FIBRE) LIQD Take 2 tablets by mouth 2 (two) times daily.        . saxagliptin HCl (ONGLYZA) 5 MG TABS tablet Take 1 tablet (5 mg total) by mouth daily.  90 tablet  4  . simvastatin (ZOCOR) 40 MG tablet Take 1 tablet (40 mg total) by mouth at bedtime.  90 tablet  4  . vitamin C (ASCORBIC ACID) 500 MG tablet Take 500 mg by mouth daily.      . vitamin E (VITAMIN E) 400 UNIT capsule Take 400 Units by mouth daily.      . Glucosamine-Chondroit-Vit C-Mn (GLUCOSAMINE-CHONDROITIN) CAPS Take 2 capsules by mouth daily.         No facility-administered encounter medications on file as of 07/21/2013.   BP 144/94  Pulse 56  Temp(Src) 98.7 F (37.1 C) (Oral)  Ht 5\' 9"  (1.753 m)  Wt 221 lb (100.245 kg)  BMI 32.62 kg/m2  SpO2  96%  Review of Systems  Constitutional: Negative for fever, chills, activity change, appetite change, fatigue and unexpected weight change.  Eyes: Negative for visual disturbance.  Respiratory: Negative for cough and shortness of breath.   Cardiovascular: Negative for chest pain, palpitations and leg swelling.  Gastrointestinal: Negative for abdominal pain and abdominal distention.  Genitourinary: Negative for dysuria, urgency and difficulty urinating.  Musculoskeletal: Negative for arthralgias and gait problem.  Skin: Negative for color change and rash.  Hematological: Negative for adenopathy.  Psychiatric/Behavioral: Negative for sleep disturbance and dysphoric mood. The patient is not nervous/anxious.        Objective:   Physical Exam  Constitutional: He is oriented to person, place, and time. He appears well-developed and well-nourished. No distress.  HENT:  Head: Normocephalic and atraumatic.  Right Ear: External ear normal.  Left Ear: External ear normal.  Nose: Nose normal.  Mouth/Throat: Oropharynx is clear and moist. No oropharyngeal exudate.  Eyes: Conjunctivae and EOM are normal. Pupils are equal, round, and reactive to light. Right eye exhibits no discharge. Left eye exhibits no discharge. No scleral icterus.  Neck: Normal range of motion. Neck supple. No tracheal deviation present. No thyromegaly present.  Cardiovascular: Normal rate, regular  rhythm and normal heart sounds.  Exam reveals no gallop and no friction rub.   No murmur heard. Pulmonary/Chest: Effort normal and breath sounds normal. No respiratory distress. He has no wheezes. He has no rales. He exhibits no tenderness.  Abdominal: Soft. Bowel sounds are normal. He exhibits no distension. There is no tenderness. There is no rebound.  Musculoskeletal: Normal range of motion. He exhibits no edema.  Lymphadenopathy:    He has no cervical adenopathy.  Neurological: He is alert and oriented to person, place, and  time. No cranial nerve deficit. Coordination normal.  Skin: Skin is warm and dry. No rash noted. He is not diaphoretic. No erythema. No pallor.  Psychiatric: He has a normal mood and affect. His behavior is normal. Judgment and thought content normal.          Assessment & Plan:

## 2013-07-21 NOTE — Assessment & Plan Note (Signed)
BP Readings from Last 3 Encounters:  07/21/13 144/94  06/03/13 120/82  06/05/12 118/60     BP slightly elevated today. Will request records from former PCP to determine why not on ACEi. Will check renal function with labs.

## 2013-07-21 NOTE — Assessment & Plan Note (Signed)
Pt reports excellent control of BG. Will check A1c with labs today. Will continue Onglyza. Follow up 3 months and prn.

## 2013-07-21 NOTE — Assessment & Plan Note (Signed)
Pt reports h/o thrombocytopenia. Will request records form Dr. Orlie Dakin. Will check CBC with labs today.

## 2013-07-22 ENCOUNTER — Encounter: Payer: Self-pay | Admitting: *Deleted

## 2013-07-22 LAB — COMPREHENSIVE METABOLIC PANEL
CO2: 27 mEq/L (ref 19–32)
Calcium: 9.2 mg/dL (ref 8.4–10.5)
Creatinine, Ser: 1 mg/dL (ref 0.4–1.5)
GFR: 79.55 mL/min (ref >60.00)
Glucose, Bld: 117 mg/dL — ABNORMAL HIGH (ref 70–99)
Total Bilirubin: 0.8 mg/dL (ref 0.3–1.2)
Total Protein: 6.8 g/dL (ref 6.0–8.3)

## 2013-07-22 LAB — LIPID PANEL
Cholesterol: 95 mg/dL (ref 0–200)
LDL Cholesterol: 46 mg/dL (ref 0–99)
VLDL: 12.8 mg/dL (ref 0.0–40.0)

## 2013-07-22 LAB — CBC WITH DIFFERENTIAL/PLATELET
Basophils Relative: 0.5 % (ref 0.0–3.0)
Eosinophils Relative: 3 % (ref 0.0–5.0)
HCT: 47 % (ref 39.0–52.0)
Hemoglobin: 15.8 g/dL (ref 13.0–17.0)
Lymphs Abs: 1.7 10*3/uL (ref 0.7–4.0)
MCV: 96.5 fl (ref 78.0–100.0)
Monocytes Absolute: 0.7 10*3/uL (ref 0.1–1.0)
Neutrophils Relative %: 58 % (ref 43.0–77.0)
RBC: 4.87 Mil/uL (ref 4.22–5.81)
WBC: 6.1 10*3/uL (ref 4.5–10.5)

## 2013-07-22 LAB — MICROALBUMIN / CREATININE URINE RATIO: Creatinine,U: 122.6 mg/dL

## 2013-09-18 DIAGNOSIS — Z23 Encounter for immunization: Secondary | ICD-10-CM | POA: Diagnosis not present

## 2013-09-23 DIAGNOSIS — L821 Other seborrheic keratosis: Secondary | ICD-10-CM | POA: Diagnosis not present

## 2013-09-23 DIAGNOSIS — D1800 Hemangioma unspecified site: Secondary | ICD-10-CM | POA: Diagnosis not present

## 2013-09-23 DIAGNOSIS — L570 Actinic keratosis: Secondary | ICD-10-CM | POA: Diagnosis not present

## 2013-09-23 DIAGNOSIS — L820 Inflamed seborrheic keratosis: Secondary | ICD-10-CM | POA: Diagnosis not present

## 2013-09-23 DIAGNOSIS — D239 Other benign neoplasm of skin, unspecified: Secondary | ICD-10-CM | POA: Diagnosis not present

## 2013-09-23 DIAGNOSIS — L578 Other skin changes due to chronic exposure to nonionizing radiation: Secondary | ICD-10-CM | POA: Diagnosis not present

## 2013-10-20 ENCOUNTER — Ambulatory Visit: Payer: Medicare Other | Admitting: Internal Medicine

## 2013-10-22 ENCOUNTER — Ambulatory Visit: Payer: Medicare Other | Admitting: Internal Medicine

## 2013-10-25 ENCOUNTER — Encounter: Payer: Self-pay | Admitting: Internal Medicine

## 2013-10-25 ENCOUNTER — Encounter (INDEPENDENT_AMBULATORY_CARE_PROVIDER_SITE_OTHER): Payer: Self-pay

## 2013-10-25 ENCOUNTER — Ambulatory Visit (INDEPENDENT_AMBULATORY_CARE_PROVIDER_SITE_OTHER): Payer: Medicare Other | Admitting: Internal Medicine

## 2013-10-25 VITALS — BP 124/84 | HR 54 | Temp 97.9°F | Wt 221.0 lb

## 2013-10-25 DIAGNOSIS — E119 Type 2 diabetes mellitus without complications: Secondary | ICD-10-CM | POA: Diagnosis not present

## 2013-10-25 DIAGNOSIS — R030 Elevated blood-pressure reading, without diagnosis of hypertension: Secondary | ICD-10-CM

## 2013-10-25 DIAGNOSIS — E785 Hyperlipidemia, unspecified: Secondary | ICD-10-CM | POA: Diagnosis not present

## 2013-10-25 DIAGNOSIS — IMO0001 Reserved for inherently not codable concepts without codable children: Secondary | ICD-10-CM

## 2013-10-25 MED ORDER — LISINOPRIL 5 MG PO TABS: 5.0000 mg | ORAL_TABLET | Freq: Every day | ORAL | Status: DC

## 2013-10-25 NOTE — Assessment & Plan Note (Signed)
Lab Results  Component Value Date   CHOL 95 07/21/2013   Lab Results  Component Value Date   HDL 36.30* 07/21/2013   Lab Results  Component Value Date   LDLCALC 46 07/21/2013   Lab Results  Component Value Date   TRIG 64.0 07/21/2013   Lab Results  Component Value Date   CHOLHDL 3 07/21/2013   No results found for this basename: LDLDIRECT   Lipids well controlled on simvastatin. Will continue.

## 2013-10-25 NOTE — Progress Notes (Signed)
Pre-visit discussion using our clinic review tool. No additional management support is needed unless otherwise documented below in the visit note.  

## 2013-10-25 NOTE — Assessment & Plan Note (Signed)
Excellent control of BG with last A1c of 6.5%. Reviewed recent BG today, mostly 80-120s. Encouraged continued compliance with healthy diet and regular exercise.  Continue Onglyza. Follow up 3 months and prn.

## 2013-10-25 NOTE — Assessment & Plan Note (Signed)
BP well controlled today, however elevated at previous visit. Discussed benefit of ACEi in setting of DM. Will add Lisinopril 5mg  daily. Check Cr and K in 1 week.

## 2013-10-25 NOTE — Progress Notes (Signed)
Subjective:    Patient ID: Richard Hardy, male    DOB: Sep 18, 1946, 67 y.o.   MRN: [id number]  HPI 67 year old male with history of diabetes, hyperlipidemia presents for followup. He brings record of blood sugars today which show low sugars between 80 and 120. He is compliant with saxagliptin. He denies any low blood sugars less than 70 or blood sugars greater than 200. He tries to follow a healthy diet and get regular physical activity. He has never in the past been on blood pressure medication. He denies any chest pain, palpitations, headache. He denies any new concerns today.  Outpatient Encounter Prescriptions as of 10/25/2013  Medication Sig  . Coenzyme Q10 (COQ10) 200 MG CAPS Take 1 tablet by mouth daily.    . fexofenadine-pseudoephedrine (ALLEGRA-D 24 HOUR) 180-240 MG per 24 hr tablet Take 1 tablet by mouth daily.  . Fish Oil OIL Take by mouth daily.    . fluticasone (FLONASE) 50 MCG/ACT nasal spray Place into the nose at bedtime.    . Nutritional Supplements (JUICE PLUS FIBRE) LIQD Take 2 tablets by mouth 2 (two) times daily.    . saxagliptin HCl (ONGLYZA) 5 MG TABS tablet Take 1 tablet (5 mg total) by mouth daily.  . simvastatin (ZOCOR) 40 MG tablet Take 1 tablet (40 mg total) by mouth at bedtime.  . vitamin C (ASCORBIC ACID) 500 MG tablet Take 500 mg by mouth daily.  . vitamin E (VITAMIN E) 400 UNIT capsule Take 400 Units by mouth daily.  . Glucosamine-Chondroit-Vit C-Mn (GLUCOSAMINE-CHONDROITIN) CAPS Take 2 capsules by mouth daily.    Marland Kitchen lisinopril (PRINIVIL,ZESTRIL) 5 MG tablet Take 1 tablet (5 mg total) by mouth daily.  . Misc Natural Products (OSTEO BI-FLEX TRIPLE STRENGTH PO) Take by mouth daily.   BP 124/84  Pulse 54  Temp(Src) 97.9 F (36.6 C) (Oral)  Wt 221 lb (100.245 kg)  SpO2 97%  Review of Systems  Constitutional: Negative for fever, chills, activity change, appetite change, fatigue and unexpected weight change.  Eyes: Negative for visual disturbance.  Respiratory:  Negative for cough and shortness of breath.   Cardiovascular: Negative for chest pain, palpitations and leg swelling.  Gastrointestinal: Negative for abdominal pain and abdominal distention.  Genitourinary: Negative for dysuria, urgency and difficulty urinating.  Musculoskeletal: Negative for arthralgias and gait problem.  Skin: Negative for color change and rash.  Hematological: Negative for adenopathy.  Psychiatric/Behavioral: Negative for sleep disturbance and dysphoric mood. The patient is not nervous/anxious.        Objective:   Physical Exam  Constitutional: He is oriented to person, place, and time. He appears well-developed and well-nourished. No distress.  HENT:  Head: Normocephalic and atraumatic.  Right Ear: External ear normal.  Left Ear: External ear normal.  Nose: Nose normal.  Mouth/Throat: Oropharynx is clear and moist. No oropharyngeal exudate.  Eyes: Conjunctivae and EOM are normal. Pupils are equal, round, and reactive to light. Right eye exhibits no discharge. Left eye exhibits no discharge. No scleral icterus.  Neck: Normal range of motion. Neck supple. No tracheal deviation present. No thyromegaly present.  Cardiovascular: Normal rate, regular rhythm and normal heart sounds.  Exam reveals no gallop and no friction rub.   No murmur heard. Pulmonary/Chest: Effort normal and breath sounds normal. No respiratory distress. He has no wheezes. He has no rales. He exhibits no tenderness.  Musculoskeletal: Normal range of motion. He exhibits no edema.  Lymphadenopathy:    He has no cervical adenopathy.  Neurological: He is  alert and oriented to person, place, and time. No cranial nerve deficit. Coordination normal.  Skin: Skin is warm and dry. No rash noted. He is not diaphoretic. No erythema. No pallor.  Psychiatric: He has a normal mood and affect. His behavior is normal. Judgment and thought content normal.          Assessment & Plan:

## 2013-10-28 ENCOUNTER — Telehealth: Payer: Self-pay | Admitting: Internal Medicine

## 2013-10-28 NOTE — Telephone Encounter (Signed)
Telephone note

## 2013-11-01 ENCOUNTER — Other Ambulatory Visit (INDEPENDENT_AMBULATORY_CARE_PROVIDER_SITE_OTHER): Payer: Medicare Other

## 2013-11-01 DIAGNOSIS — E119 Type 2 diabetes mellitus without complications: Secondary | ICD-10-CM

## 2013-11-01 LAB — COMPREHENSIVE METABOLIC PANEL
ALT: 27 U/L (ref 0–53)
AST: 24 U/L (ref 0–37)
Alkaline Phosphatase: 55 U/L (ref 39–117)
BUN: 15 mg/dL (ref 6–23)
Calcium: 9.1 mg/dL (ref 8.4–10.5)
Chloride: 107 mEq/L (ref 96–112)
Creatinine, Ser: 0.9 mg/dL (ref 0.4–1.5)
Glucose, Bld: 122 mg/dL — ABNORMAL HIGH (ref 70–99)
Total Bilirubin: 0.9 mg/dL (ref 0.3–1.2)

## 2013-11-01 LAB — MICROALBUMIN / CREATININE URINE RATIO
Creatinine,U: 170.5 mg/dL
Microalb Creat Ratio: 0.3 mg/g (ref 0.0–30.0)
Microalb, Ur: 0.5 mg/dL (ref 0.0–1.9)

## 2013-11-02 ENCOUNTER — Encounter: Payer: Self-pay | Admitting: *Deleted

## 2013-11-24 ENCOUNTER — Ambulatory Visit (INDEPENDENT_AMBULATORY_CARE_PROVIDER_SITE_OTHER): Payer: Medicare Other | Admitting: Family Medicine

## 2013-11-24 ENCOUNTER — Telehealth: Payer: Self-pay | Admitting: Internal Medicine

## 2013-11-24 ENCOUNTER — Encounter: Payer: Self-pay | Admitting: Family Medicine

## 2013-11-24 VITALS — BP 126/82 | HR 60 | Temp 98.3°F | Wt 223.0 lb

## 2013-11-24 DIAGNOSIS — R05 Cough: Secondary | ICD-10-CM | POA: Diagnosis not present

## 2013-11-24 DIAGNOSIS — R059 Cough, unspecified: Secondary | ICD-10-CM | POA: Diagnosis not present

## 2013-11-24 MED ORDER — GUAIFENESIN-CODEINE 100-10 MG/5ML PO SYRP: 5.0000 mL | ORAL_SOLUTION | Freq: Every evening | ORAL | Status: DC | PRN

## 2013-11-24 NOTE — Telephone Encounter (Signed)
Patient Information:  Caller Name: Dillinger  Phone: [phone]  Patient: Richard, Hardy  Gender: Male  DOB: Jan 12, 1946  Age: 67 Years  PCP: Ronna Polio (Adults only)  Office Follow Up:  Does the office need to follow up with this patient?: No  Instructions For The Office: N/A  RN Note:  Sinus pain present under eyes intermittently. Cough began after starting Lisinopril.  Stopped taking Lisinopril 11/23/13.  No appointments at [facility] for 11/23/13 or 11/24/13.  Scheduled with Dr Sharen Hones at [facility] for 1115 11/24/13.   Symptoms  Reason For Call & Symptoms: Increasing frequency of deep, productive cough with head congestion and sore throat ; takes Lisinopril.  Has not tested FBS for several days; been running 112-114.  Reviewed Health History In EMR: Yes  Reviewed Medications In EMR: Yes  Reviewed Allergies In EMR: Yes  Reviewed Surgeries / Procedures: Yes  Date of Onset of Symptoms: 11/22/2013  Treatments Tried: Vicks Nyquil, Tylenol liquid nighttime for multiple cold symptoms.  Treatments Tried Worked: No  Guideline(s) Used:  Cough  Disposition Per Guideline:   See Today in Office  Reason For Disposition Reached:   Sinus pain persists after using nasal washes and pain medicine > 24 hours  Advice Given:  Reassurance  Coughing is the way that our lungs remove irritants and mucus. It helps protect our lungs from getting pneumonia.  You can get a dry hacking cough after a chest cold. Sometimes this type of cough can last 1-3 weeks, and be worse at night.  Coughing Spasms:  Drink warm fluids. Inhale warm mist (Reason: both relax the airway and loosen up the phlegm).  Prevent Dehydration:  Drink adequate liquids.  This will help soothe an irritated or dry throat and loosen up the phlegm.  Call Back If:  Difficulty breathing  Cough lasts more than 3 weeks  Fever lasts > 3 days  You become worse.  Patient Will Follow Care Advice:  YES

## 2013-11-24 NOTE — Progress Notes (Signed)
   Subjective:    Patient ID: Richard Hardy, male    DOB: [birth year], 67 y.o.   MRN: [id number]  HPI CC: cough  Recently started on lisinopril last month.  Several day h/o dry cough that has progressed into persistent productive cough.  Last night had increasing chest congestion, head congestion.  + PNdrainage with ST.  Woke up last night 2/2 cough.  No color in cough.  No fevers/chills, abd pain, nausea, ear or tooth pain.  No RN  H/o bronchitis, this feels similar. Has tried tylenol multisymptom. No sick contacts that he knows of. No smokers at home. No h/o asthma, COPD, allergic rhinitis. Did receive flu shot this year. + h/o OSA.  Past Medical History  Diagnosis Date  . Obesity   . Glucose intolerance (impaired glucose tolerance)   . Hyperlipidemia   . Sleep apnea     On BiPAP, Dr. Willeen Cass  . Thrombocythemia     Dr. Orlie Dakin  . Diabetes mellitus without complication 2009    Review of Systems Per HPI    Objective:   Physical Exam  Nursing note and vitals reviewed. Constitutional: He appears well-developed and well-nourished. No distress.  HENT:  Head: Normocephalic and atraumatic.  Right Ear: Hearing, tympanic membrane, external ear and ear canal normal.  Left Ear: Hearing, tympanic membrane, external ear and ear canal normal.  Nose: Nose normal. No mucosal edema or rhinorrhea. Right sinus exhibits no maxillary sinus tenderness and no frontal sinus tenderness. Left sinus exhibits no maxillary sinus tenderness and no frontal sinus tenderness.  Mouth/Throat: Uvula is midline, oropharynx is clear and moist and mucous membranes are normal. No oropharyngeal exudate, posterior oropharyngeal edema, posterior oropharyngeal erythema or tonsillar abscesses.  Congested nasal mucosa Post nasal drainage present  Eyes: Conjunctivae and EOM are normal. Pupils are equal, round, and reactive to light. No scleral icterus.  Neck: Normal range of motion. Neck supple.  Cardiovascular:  Normal rate, regular rhythm, normal heart sounds and intact distal pulses.   No murmur heard. Pulmonary/Chest: Effort normal and breath sounds normal. No respiratory distress. He has no wheezes. He has no rales.  Lymphadenopathy:    He has no cervical adenopathy.  Skin: Skin is warm and dry. No rash noted.       Assessment & Plan:

## 2013-11-24 NOTE — Assessment & Plan Note (Signed)
Discussed with patient ACEI induced cough - this does sound consistent with some of today's symptomatology, however I don't think fully attributable to all sxs. I anticipate he has a viral bronchitis today. Doubt bacterial source currently so no abx. Codeine cough syrup for symptomatic relief as well as other supportive measures as per patient instructions. I recommended holding ACEI until feeling better, then retrial of med and then we will know definitively if cough is ACEI induced. Red flags to return or seek care discussed. Pt agrees with plan.

## 2013-11-24 NOTE — Telephone Encounter (Signed)
FYI-appt at [facility] today

## 2013-11-24 NOTE — Progress Notes (Signed)
Pre-visit discussion using our clinic review tool. No additional management support is needed unless otherwise documented below in the visit note.  

## 2013-11-24 NOTE — Patient Instructions (Signed)
The lisinopril can cause a dry cough, but it's hard to tell if this is fully responsible for your symptoms today. I do think you have an early case of bronchitis - but likely a viral process.  I would suggest a codeine cough syrup for night time to help you rest. Push fluids and plenty of rest.  May use plain mucinex or immediate release guaifenesin (over the counter) with plenty of water to help mobilize mucous Let's hold lisinopril until you start feeling better then may do another trial of this medicine. Watch for fever >101, or worsening productive cough - if this happens, let us know.

## 2014-01-25 ENCOUNTER — Ambulatory Visit: Payer: Medicare Other | Admitting: Internal Medicine

## 2014-02-18 ENCOUNTER — Encounter (INDEPENDENT_AMBULATORY_CARE_PROVIDER_SITE_OTHER): Payer: Self-pay

## 2014-02-18 ENCOUNTER — Encounter: Payer: Self-pay | Admitting: Internal Medicine

## 2014-02-18 ENCOUNTER — Ambulatory Visit (INDEPENDENT_AMBULATORY_CARE_PROVIDER_SITE_OTHER): Payer: Medicare Other | Admitting: Internal Medicine

## 2014-02-18 VITALS — BP 134/88 | HR 54 | Temp 97.8°F | Wt 220.0 lb

## 2014-02-18 DIAGNOSIS — E119 Type 2 diabetes mellitus without complications: Secondary | ICD-10-CM

## 2014-02-18 DIAGNOSIS — I10 Essential (primary) hypertension: Secondary | ICD-10-CM | POA: Diagnosis not present

## 2014-02-18 DIAGNOSIS — E785 Hyperlipidemia, unspecified: Secondary | ICD-10-CM

## 2014-02-18 DIAGNOSIS — N529 Male erectile dysfunction, unspecified: Secondary | ICD-10-CM | POA: Insufficient documentation

## 2014-02-18 MED ORDER — LOSARTAN POTASSIUM 25 MG PO TABS: 25.0000 mg | ORAL_TABLET | Freq: Every day | ORAL | Status: DC

## 2014-02-18 MED ORDER — SILDENAFIL CITRATE 50 MG PO TABS: 50.0000 mg | ORAL_TABLET | Freq: Every day | ORAL | Status: DC | PRN

## 2014-02-18 MED ORDER — GLUCOSE BLOOD VI STRP: ORAL_STRIP | Status: DC

## 2014-02-18 NOTE — Progress Notes (Signed)
Pre visit review using our clinic review tool, if applicable. No additional management support is needed unless otherwise documented below in the visit note. 

## 2014-02-18 NOTE — Assessment & Plan Note (Signed)
Symptoms not improved with Cialis. Will try change to Viagra. Follow up prn.

## 2014-02-18 NOTE — Assessment & Plan Note (Addendum)
Will check labs next week including LFTs and lipids. Continue Simvastatin.

## 2014-02-18 NOTE — Assessment & Plan Note (Signed)
BG well controlled per pt report. Will continue Current medications. Will check A1c with labs next week.

## 2014-02-18 NOTE — Assessment & Plan Note (Signed)
BP Readings from Last 3 Encounters:  02/18/14 134/88  11/24/13 126/82  10/25/13 124/84   BP slightly high today. Pt appears to have allergy to Lisinopril. Will stop lisinopril and start Losartan 25mg  daily. Plan to recheck Cr and K next week. Follow up in 3 months and prn.

## 2014-02-18 NOTE — Progress Notes (Signed)
Subjective:    Patient ID: Richard Hardy, male    DOB: 1946-11-06, 68 y.o.   MRN: [id number]  HPI 68YO male with DM, HTN HL presents for follow up.  DM - BG running near 100. Compliant with meds.  November developed cough. Was seen by Dr. Danise Mina. Cough persistent x1 month. Cough has now improved, however when he tried to start back on Lisinopril, cough recurred again.  Erectile dysfunction - Symptoms of ED, followed by urologist. Recently treated with Cialis 5mg  with no improvement. Would like to try Viagra.   Outpatient Encounter Prescriptions as of 02/18/2014  Medication Sig  . Coenzyme Q10 (COQ10) 200 MG CAPS Take 1 tablet by mouth daily.    . fexofenadine-pseudoephedrine (ALLEGRA-D 24 HOUR) 180-240 MG per 24 hr tablet Take 1 tablet by mouth daily.  . Fish Oil OIL Take by mouth daily.    . Glucosamine-Chondroit-Vit C-Mn (GLUCOSAMINE-CHONDROITIN) CAPS Take 2 capsules by mouth daily.    . Misc Natural Products (OSTEO BI-FLEX TRIPLE STRENGTH PO) Take by mouth daily.  . Nutritional Supplements (JUICE PLUS FIBRE) LIQD Take 2 tablets by mouth 2 (two) times daily.    . saxagliptin HCl (ONGLYZA) 5 MG TABS tablet Take 1 tablet (5 mg total) by mouth daily.  . simvastatin (ZOCOR) 40 MG tablet Take 1 tablet (40 mg total) by mouth at bedtime.  . vitamin C (ASCORBIC ACID) 500 MG tablet Take 500 mg by mouth daily.  . vitamin E (VITAMIN E) 400 UNIT capsule Take 400 Units by mouth daily.  . fluticasone (FLONASE) 50 MCG/ACT nasal spray Place into the nose at bedtime.    Marland Kitchen glucose blood (BAYER CONTOUR TEST) test strip Use as instructed  . losartan (COZAAR) 25 MG tablet Take 1 tablet (25 mg total) by mouth daily.  . sildenafil (VIAGRA) 50 MG tablet Take 1 tablet (50 mg total) by mouth daily as needed for erectile dysfunction.  . [DISCONTINUED] guaiFENesin-codeine (ROBITUSSIN AC) 100-10 MG/5ML syrup Take 5 mLs by mouth at bedtime as needed for cough.  . [DISCONTINUED] lisinopril (PRINIVIL,ZESTRIL) 5  MG tablet Take 1 tablet (5 mg total) by mouth daily.    Review of Systems  Constitutional: Negative for fever, chills, activity change, appetite change, fatigue and unexpected weight change.  Eyes: Negative for visual disturbance.  Respiratory: Positive for cough (when taking Lisinopril). Negative for shortness of breath.   Cardiovascular: Negative for chest pain, palpitations and leg swelling.  Gastrointestinal: Negative for abdominal pain and abdominal distention.  Genitourinary: Negative for dysuria, urgency and difficulty urinating.  Musculoskeletal: Negative for arthralgias and gait problem.  Skin: Negative for color change and rash.  Hematological: Negative for adenopathy.  Psychiatric/Behavioral: Negative for sleep disturbance and dysphoric mood. The patient is not nervous/anxious.        Objective:    BP 134/88  Pulse 54  Temp(Src) 97.8 F (36.6 C) (Oral)  Wt 220 lb (99.791 kg)  SpO2 96% Physical Exam  Constitutional: He is oriented to person, place, and time. He appears well-developed and well-nourished. No distress.  HENT:  Head: Normocephalic and atraumatic.  Right Ear: External ear normal.  Left Ear: External ear normal.  Nose: Nose normal.  Mouth/Throat: Oropharynx is clear and moist. No oropharyngeal exudate.  Eyes: Conjunctivae and EOM are normal. Pupils are equal, round, and reactive to light. Right eye exhibits no discharge. Left eye exhibits no discharge. No scleral icterus.  Neck: Normal range of motion. Neck supple. No tracheal deviation present. No thyromegaly present.  Cardiovascular:  Normal rate, regular rhythm and normal heart sounds.  Exam reveals no gallop and no friction rub.   No murmur heard. Pulmonary/Chest: Effort normal and breath sounds normal. No accessory muscle usage. Not tachypneic. No respiratory distress. He has no decreased breath sounds. He has no wheezes. He has no rhonchi. He has no rales. He exhibits no tenderness.  Musculoskeletal:  Normal range of motion. He exhibits no edema.  Lymphadenopathy:    He has no cervical adenopathy.  Neurological: He is alert and oriented to person, place, and time. No cranial nerve deficit. Coordination normal.  Skin: Skin is warm and dry. No rash noted. He is not diaphoretic. No erythema. No pallor.  Psychiatric: He has a normal mood and affect. His behavior is normal. Judgment and thought content normal.          Assessment & Plan:   Problem List Items Addressed This Visit   Diabetes type 2, controlled - Primary     BG well controlled per pt report. Will continue Current medications. Will check A1c with labs next week.    Relevant Medications      losartan (COZAAR) tablet      glucose blood (BAYER CONTOUR TEST) test strip   Other Relevant Orders      Comprehensive metabolic panel      Hemoglobin A1c      Microalbumin / creatinine urine ratio      Lipid panel   Erectile dysfunction     Symptoms not improved with Cialis. Will try change to Viagra. Follow up prn.    Relevant Medications      sildenafil (VIAGRA) tablet   Hyperlipemia     Will check labs next week including LFTs and lipids. Continue Simvastatin.    Hypertension      BP Readings from Last 3 Encounters:  02/18/14 134/88  11/24/13 126/82  10/25/13 124/84   BP slightly high today. Pt appears to have allergy to Lisinopril. Will stop lisinopril and start Losartan 25mg  daily. Plan to recheck Cr and K next week. Follow up in 3 months and prn.        Return in about 3 months (around 05/21/2014).

## 2014-02-18 NOTE — Patient Instructions (Addendum)
Stop Lisinopril.  Start Losartan 25mg  daily.  Labs next week.

## 2014-02-23 ENCOUNTER — Telehealth: Payer: Self-pay | Admitting: *Deleted

## 2014-02-23 ENCOUNTER — Other Ambulatory Visit: Payer: Self-pay | Admitting: *Deleted

## 2014-02-23 DIAGNOSIS — E119 Type 2 diabetes mellitus without complications: Secondary | ICD-10-CM

## 2014-02-23 NOTE — Telephone Encounter (Signed)
Patient wife left a message stating the patient is having some issues getting 2 of his medications refilled. Called and spoke with patient wife stating they will not refill his test strips unless they receive a hard copy from our office. The Viagra is too expensive and would like to know if there is anything less expensive?

## 2014-02-23 NOTE — Telephone Encounter (Signed)
Pharmacy note:  [employer] contour test need directions

## 2014-02-23 NOTE — Telephone Encounter (Signed)
Fine to call in test strips #100 refill 11.  He should check BG 1-2 times daily.  There is not a less expensive medication for erectile dysfunction.

## 2014-02-24 MED ORDER — GLUCOSE BLOOD VI STRP: ORAL_STRIP | Status: DC

## 2014-02-24 NOTE — Telephone Encounter (Signed)
Rx sent to pharmacy by escript. Pt's wife notified.

## 2014-02-25 ENCOUNTER — Other Ambulatory Visit (INDEPENDENT_AMBULATORY_CARE_PROVIDER_SITE_OTHER): Payer: Medicare Other

## 2014-02-25 ENCOUNTER — Telehealth: Payer: Self-pay | Admitting: Internal Medicine

## 2014-02-25 DIAGNOSIS — I10 Essential (primary) hypertension: Secondary | ICD-10-CM

## 2014-02-25 DIAGNOSIS — E119 Type 2 diabetes mellitus without complications: Secondary | ICD-10-CM | POA: Diagnosis not present

## 2014-02-25 DIAGNOSIS — E785 Hyperlipidemia, unspecified: Secondary | ICD-10-CM

## 2014-02-25 LAB — COMPREHENSIVE METABOLIC PANEL
ALBUMIN: 4.3 g/dL (ref 3.5–5.2)
ALK PHOS: 57 U/L (ref 39–117)
ALT: 25 U/L (ref 0–53)
AST: 22 U/L (ref 0–37)
BUN: 16 mg/dL (ref 6–23)
CO2: 31 meq/L (ref 19–32)
Calcium: 9.4 mg/dL (ref 8.4–10.5)
Chloride: 105 mEq/L (ref 96–112)
Creatinine, Ser: 1.1 mg/dL (ref 0.4–1.5)
GFR: 71.07 mL/min (ref >60.00)
Glucose, Bld: 136 mg/dL — ABNORMAL HIGH (ref 70–99)
POTASSIUM: 5.4 meq/L — AB (ref 3.5–5.1)
SODIUM: 141 meq/L (ref 135–145)
Total Bilirubin: 1 mg/dL (ref 0.3–1.2)
Total Protein: 7 g/dL (ref 6.0–8.3)

## 2014-02-25 LAB — LIPID PANEL
CHOL/HDL RATIO: 3
CHOLESTEROL: 111 mg/dL (ref 0–200)
HDL: 39.7 mg/dL (ref >39.00)
LDL Cholesterol: 66 mg/dL (ref 0–99)
Triglycerides: 28 mg/dL (ref 0.0–149.0)
VLDL: 5.6 mg/dL (ref 0.0–40.0)

## 2014-02-25 LAB — MICROALBUMIN / CREATININE URINE RATIO
Creatinine,U: 171.3 mg/dL
MICROALB UR: 0.6 mg/dL (ref 0.0–1.9)
Microalb Creat Ratio: 0.4 mg/g (ref 0.0–30.0)

## 2014-02-25 LAB — HEMOGLOBIN A1C: HEMOGLOBIN A1C: 6.6 % — AB (ref 4.6–6.5)

## 2014-02-25 NOTE — Telephone Encounter (Signed)
Pt came in today to get labs done and he had several questions about medication and would like someone to call him

## 2014-02-28 ENCOUNTER — Telehealth: Payer: Self-pay | Admitting: *Deleted

## 2014-02-28 ENCOUNTER — Telehealth: Payer: Self-pay | Admitting: Internal Medicine

## 2014-02-28 NOTE — Telephone Encounter (Signed)
Pt is coming in tomorrow what labs and dx?  

## 2014-02-28 NOTE — Telephone Encounter (Signed)
Called patient again, per patient he will ask when he comes in for his labs.

## 2014-02-28 NOTE — Telephone Encounter (Signed)
BMP for hyperkalemia 

## 2014-02-28 NOTE — Telephone Encounter (Signed)
Called patient to informed him of lab results, did not mention anything else but will call back to make sure.

## 2014-02-28 NOTE — Telephone Encounter (Signed)
Relevant patient education assigned to patient using Emmi. ° °

## 2014-03-01 ENCOUNTER — Encounter: Payer: Self-pay | Admitting: *Deleted

## 2014-03-01 ENCOUNTER — Other Ambulatory Visit (INDEPENDENT_AMBULATORY_CARE_PROVIDER_SITE_OTHER): Payer: Medicare Other

## 2014-03-01 ENCOUNTER — Other Ambulatory Visit: Payer: Self-pay | Admitting: *Deleted

## 2014-03-01 DIAGNOSIS — E875 Hyperkalemia: Secondary | ICD-10-CM | POA: Diagnosis not present

## 2014-03-01 LAB — BASIC METABOLIC PANEL
BUN: 14 mg/dL (ref 6–23)
CHLORIDE: 105 meq/L (ref 96–112)
CO2: 31 mEq/L (ref 19–32)
Calcium: 9.4 mg/dL (ref 8.4–10.5)
Creatinine, Ser: 0.9 mg/dL (ref 0.4–1.5)
GFR: 86.42 mL/min (ref >60.00)
Glucose, Bld: 107 mg/dL — ABNORMAL HIGH (ref 70–99)
POTASSIUM: 4.5 meq/L (ref 3.5–5.1)
Sodium: 141 mEq/L (ref 135–145)

## 2014-05-25 ENCOUNTER — Ambulatory Visit (INDEPENDENT_AMBULATORY_CARE_PROVIDER_SITE_OTHER): Payer: Medicare Other | Admitting: Internal Medicine

## 2014-05-25 ENCOUNTER — Encounter: Payer: Self-pay | Admitting: Internal Medicine

## 2014-05-25 ENCOUNTER — Encounter (INDEPENDENT_AMBULATORY_CARE_PROVIDER_SITE_OTHER): Payer: Self-pay

## 2014-05-25 VITALS — BP 130/80 | HR 42 | Temp 97.8°F | Ht 70.0 in | Wt 211.5 lb

## 2014-05-25 DIAGNOSIS — E119 Type 2 diabetes mellitus without complications: Secondary | ICD-10-CM

## 2014-05-25 DIAGNOSIS — Z23 Encounter for immunization: Secondary | ICD-10-CM

## 2014-05-25 DIAGNOSIS — E785 Hyperlipidemia, unspecified: Secondary | ICD-10-CM

## 2014-05-25 DIAGNOSIS — I10 Essential (primary) hypertension: Secondary | ICD-10-CM

## 2014-05-25 NOTE — Progress Notes (Signed)
Subjective:    Patient ID: Richard Hardy, male    DOB: 1946-10-11, 68 y.o.   MRN: [id number]  HPI 68YO male presents for follow up.  DM - BG 119 today. Typically, near 100-120. Compliant with medication. No side effects noted. Trying to follow healthy diet and stay active.  Did not start back on Losartan after stopping it in 02/2014. Med was initially held because of slight increase in potassium  No other concerns today.     Review of Systems  Constitutional: Negative for fever, chills, activity change, appetite change, fatigue and unexpected weight change.  Eyes: Negative for visual disturbance.  Respiratory: Negative for cough and shortness of breath.   Cardiovascular: Negative for chest pain, palpitations and leg swelling.  Gastrointestinal: Negative for abdominal pain and abdominal distention.  Genitourinary: Negative for dysuria, urgency and difficulty urinating.  Musculoskeletal: Negative for arthralgias and gait problem.  Skin: Negative for color change and rash.  Hematological: Negative for adenopathy.  Psychiatric/Behavioral: Negative for sleep disturbance and dysphoric mood. The patient is not nervous/anxious.        Objective:    BP 130/80  Pulse 42  Temp(Src) 97.8 F (36.6 C) (Oral)  Ht 5\' 10"  (1.778 m)  Wt 211 lb 8 oz (95.936 kg)  BMI 30.35 kg/m2  SpO2 96% Physical Exam  Constitutional: He is oriented to person, place, and time. He appears well-developed and well-nourished. No distress.  HENT:  Head: Normocephalic and atraumatic.  Right Ear: External ear normal.  Left Ear: External ear normal.  Nose: Nose normal.  Mouth/Throat: Oropharynx is clear and moist. No oropharyngeal exudate.  Eyes: Conjunctivae and EOM are normal. Pupils are equal, round, and reactive to light. Right eye exhibits no discharge. Left eye exhibits no discharge. No scleral icterus.  Neck: Normal range of motion. Neck supple. No tracheal deviation present. No thyromegaly present.    Cardiovascular: Normal rate, regular rhythm and normal heart sounds.  Exam reveals no gallop and no friction rub.   No murmur heard. Pulmonary/Chest: Effort normal and breath sounds normal. No accessory muscle usage. Not tachypneic. No respiratory distress. He has no decreased breath sounds. He has no wheezes. He has no rhonchi. He has no rales. He exhibits no tenderness.  Musculoskeletal: Normal range of motion. He exhibits no edema.  Lymphadenopathy:    He has no cervical adenopathy.  Neurological: He is alert and oriented to person, place, and time. No cranial nerve deficit. Coordination normal.  Skin: Skin is warm and dry. No rash noted. He is not diaphoretic. No erythema. No pallor.  Psychiatric: He has a normal mood and affect. His behavior is normal. Judgment and thought content normal.          Assessment & Plan:   Problem List Items Addressed This Visit     Unprioritized   Diabetes type 2, controlled - Primary     Will check A1c with labs next week. Continue Onglyza.    Relevant Orders      Comprehensive metabolic panel      Hemoglobin A1c   Hyperlipemia     Will check LFTs with labs next week. Recent lipids well controlled. Continue Simvastatin.    Hypertension      BP Readings from Last 3 Encounters:  05/25/14 130/80  02/18/14 134/88  11/24/13 126/82   BP well controlled without medication, however would like to add ARB back for renal protection. Will restart Losartan 25mg  daily and recheck Cr and K next week.  Other Visit Diagnoses   Need for prophylactic vaccination against Streptococcus pneumoniae (pneumococcus)        Relevant Orders       Pneumococcal conjugate vaccine 13-valent (Completed)        Return in about 3 months (around 08/25/2014).

## 2014-05-25 NOTE — Patient Instructions (Signed)
Start back on Losartan 25mg  daily.  Check labs next week.

## 2014-05-25 NOTE — Assessment & Plan Note (Signed)
Will check LFTs with labs next week. Recent lipids well controlled. Continue Simvastatin.

## 2014-05-25 NOTE — Assessment & Plan Note (Signed)
BP Readings from Last 3 Encounters:  05/25/14 130/80  02/18/14 134/88  11/24/13 126/82   BP well controlled without medication, however would like to add ARB back for renal protection. Will restart Losartan 25mg  daily and recheck Cr and K next week.

## 2014-05-25 NOTE — Assessment & Plan Note (Signed)
Will check A1c with labs next week. Continue Onglyza.

## 2014-06-02 ENCOUNTER — Encounter: Payer: Self-pay | Admitting: *Deleted

## 2014-06-02 ENCOUNTER — Other Ambulatory Visit (INDEPENDENT_AMBULATORY_CARE_PROVIDER_SITE_OTHER): Payer: Medicare Other

## 2014-06-02 DIAGNOSIS — E119 Type 2 diabetes mellitus without complications: Secondary | ICD-10-CM | POA: Diagnosis not present

## 2014-06-02 LAB — COMPREHENSIVE METABOLIC PANEL
ALT: 25 U/L (ref 0–53)
AST: 22 U/L (ref 0–37)
Albumin: 3.9 g/dL (ref 3.5–5.2)
Alkaline Phosphatase: 52 U/L (ref 39–117)
BUN: 19 mg/dL (ref 6–23)
CHLORIDE: 108 meq/L (ref 96–112)
CO2: 31 meq/L (ref 19–32)
CREATININE: 1 mg/dL (ref 0.4–1.5)
Calcium: 9.1 mg/dL (ref 8.4–10.5)
GFR: 78.44 mL/min (ref >60.00)
Glucose, Bld: 111 mg/dL — ABNORMAL HIGH (ref 70–99)
Potassium: 4.7 mEq/L (ref 3.5–5.1)
Sodium: 142 mEq/L (ref 135–145)
TOTAL PROTEIN: 6.2 g/dL (ref 6.0–8.3)
Total Bilirubin: 0.6 mg/dL (ref 0.2–1.2)

## 2014-06-02 LAB — HEMOGLOBIN A1C: Hgb A1c MFr Bld: 6.4 % (ref 4.6–6.5)

## 2014-06-03 ENCOUNTER — Encounter: Payer: Self-pay | Admitting: Cardiovascular Disease

## 2014-06-03 ENCOUNTER — Ambulatory Visit (INDEPENDENT_AMBULATORY_CARE_PROVIDER_SITE_OTHER): Payer: Medicare Other | Admitting: Cardiovascular Disease

## 2014-06-03 VITALS — BP 132/62 | HR 52 | Ht 70.0 in | Wt 217.5 lb

## 2014-06-03 DIAGNOSIS — I10 Essential (primary) hypertension: Secondary | ICD-10-CM | POA: Diagnosis not present

## 2014-06-03 DIAGNOSIS — E119 Type 2 diabetes mellitus without complications: Secondary | ICD-10-CM

## 2014-06-03 DIAGNOSIS — I471 Supraventricular tachycardia, unspecified: Secondary | ICD-10-CM

## 2014-06-03 DIAGNOSIS — I498 Other specified cardiac arrhythmias: Secondary | ICD-10-CM | POA: Diagnosis not present

## 2014-06-03 DIAGNOSIS — E785 Hyperlipidemia, unspecified: Secondary | ICD-10-CM

## 2014-06-03 DIAGNOSIS — I4949 Other premature depolarization: Secondary | ICD-10-CM

## 2014-06-03 NOTE — Assessment & Plan Note (Signed)
Denies having any arrhythmia, no significant palpitations or tachycardia. No medication changes made

## 2014-06-03 NOTE — Assessment & Plan Note (Signed)
Hemoglobin A1c 6.4. Doing very well

## 2014-06-03 NOTE — Patient Instructions (Signed)
You are doing well. No medication changes were made.  Please call us if you have new issues that need to be addressed before your next appt.  Your physician wants you to follow-up in: 12 months.  You will receive a reminder letter in the mail two months in advance. If you don't receive a letter, please call our office to schedule the follow-up appointment. 

## 2014-06-03 NOTE — Progress Notes (Signed)
Patient ID: Richard Hardy, male    DOB: January 13, 1946, 68 y.o.   MRN: [id number]  HPI Comments: Richard Hardy is a  68 year old male with a history of  hyperlipidemia, glucose intolerance, obstructive sleep apnea on CPAP, Borderline diabetes,  and a strong family history of coronary artery disease who returns for routine followup. 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       overall he states that he has been doing well. He denies any significant lightheadedness or dizziness. Continues to work for [employer], do significant walking on the job, also active at home. No symptoms of chest pain or shortness of breath with exertion.  Palpitations have improved.    Previously had significant GI upset on metformin.    seen by Dr. Grayland Ormond of hematology oncology for prior drop in his platelets. Aspirin was held through this.     Myoview in 2009 which showed an EF of 58% with no evidence of ischemia or infarct.     Previous  ABIs which were normal at 1.3 on the right and 1.2 on the left.    Total cholesterol 106, LDL 62 HDL 34, hemoglobin A1c 6.4 EKG shows normal sinus rhythm with rate 52 beats per minute, no significant ST or T wave changes      Outpatient Encounter Prescriptions as of 06/03/2014  Medication Sig  . Coenzyme Q10 (COQ10) 200 MG CAPS Take 1 tablet by mouth daily.    . fexofenadine-pseudoephedrine (ALLEGRA-D 24 HOUR) 180-240 MG per 24 hr tablet Take 1 tablet by mouth daily.  . Fish Oil OIL Take by mouth daily.    . fluticasone (FLONASE) 50 MCG/ACT nasal spray Place into the nose at bedtime.    . Glucosamine-Chondroit-Vit C-Mn (GLUCOSAMINE-CHONDROITIN) CAPS Take 2 capsules by mouth daily.    Marland Kitchen glucose blood (BAYER CONTOUR TEST) test strip Check blood sugar 1-2 times daily dx: 250.00  . losartan (COZAAR) 25 MG tablet Take 1 tablet (25 mg total) by mouth daily.  . Misc Natural Products (OSTEO BI-FLEX TRIPLE STRENGTH PO) Take by mouth daily.  . Nutritional Supplements (JUICE  PLUS FIBRE) LIQD Take 2 tablets by mouth 2 (two) times daily.    . saxagliptin HCl (ONGLYZA) 5 MG TABS tablet Take 1 tablet (5 mg total) by mouth daily.  . sildenafil (VIAGRA) 50 MG tablet Take 1 tablet (50 mg total) by mouth daily as needed for erectile dysfunction.  . simvastatin (ZOCOR) 40 MG tablet Take 1 tablet (40 mg total) by mouth at bedtime.  . vitamin C (ASCORBIC ACID) 500 MG tablet Take 500 mg by mouth daily.  . vitamin E (VITAMIN E) 400 UNIT capsule Take 400 Units by mouth daily.    Review of Systems  Constitutional: Negative.   HENT: Negative.   Eyes: Negative.   Respiratory: Negative.   Cardiovascular: Negative.   Gastrointestinal: Negative.   Endocrine: Negative.   Musculoskeletal: Negative.   Skin: Negative.   Allergic/Immunologic: Negative.   Neurological: Negative.   Hematological: Negative.   Psychiatric/Behavioral: Negative.   All other systems reviewed and are negative.   BP 132/62  Pulse 52  Ht 5\' 10"  (1.778 m)  Wt 217 lb 8 oz (98.657 kg)  BMI 31.21 kg/m2  Physical Exam  Nursing note and vitals reviewed. Constitutional: He is oriented to person, place, and time. He appears well-developed and well-nourished.  HENT:  Head: Normocephalic.  Nose: Nose normal.  Mouth/Throat: Oropharynx is clear and moist.  Eyes: Conjunctivae are normal. Pupils are equal,  round, and reactive to light.  Neck: Normal range of motion. Neck supple. No JVD present.  Cardiovascular: Normal rate, regular rhythm, S1 normal, S2 normal, normal heart sounds and intact distal pulses.  Exam reveals no gallop and no friction rub.   No murmur heard. Pulmonary/Chest: Effort normal and breath sounds normal. No respiratory distress. He has no wheezes. He has no rales. He exhibits no tenderness.  Abdominal: Soft. Bowel sounds are normal. He exhibits no distension. There is no tenderness.  Musculoskeletal: Normal range of motion. He exhibits no edema and no tenderness.  Lymphadenopathy:     He has no cervical adenopathy.  Neurological: He is alert and oriented to person, place, and time. Coordination normal.  Skin: Skin is warm and dry. No rash noted. No erythema.  Psychiatric: He has a normal mood and affect. His behavior is normal. Judgment and thought content normal.      Assessment and Plan

## 2014-06-03 NOTE — Assessment & Plan Note (Signed)
Cholesterol is at goal on the current lipid regimen. No changes to the medications were made.  

## 2014-06-03 NOTE — Assessment & Plan Note (Signed)
Blood pressure is well controlled on today's visit. No changes made to the medications. 

## 2014-06-03 NOTE — Assessment & Plan Note (Signed)
Asymptomatic bradycardia. We'll watch for now

## 2014-07-20 DIAGNOSIS — E119 Type 2 diabetes mellitus without complications: Secondary | ICD-10-CM | POA: Diagnosis not present

## 2014-07-26 LAB — HM DIABETES EYE EXAM

## 2014-08-26 ENCOUNTER — Ambulatory Visit (INDEPENDENT_AMBULATORY_CARE_PROVIDER_SITE_OTHER): Payer: Medicare Other | Admitting: Internal Medicine

## 2014-08-26 ENCOUNTER — Encounter: Payer: Self-pay | Admitting: *Deleted

## 2014-08-26 ENCOUNTER — Encounter: Payer: Self-pay | Admitting: Internal Medicine

## 2014-08-26 VITALS — BP 124/72 | HR 48 | Temp 98.2°F | Ht 70.0 in | Wt 213.0 lb

## 2014-08-26 DIAGNOSIS — Z23 Encounter for immunization: Secondary | ICD-10-CM

## 2014-08-26 DIAGNOSIS — I10 Essential (primary) hypertension: Secondary | ICD-10-CM

## 2014-08-26 DIAGNOSIS — E785 Hyperlipidemia, unspecified: Secondary | ICD-10-CM | POA: Diagnosis not present

## 2014-08-26 DIAGNOSIS — E119 Type 2 diabetes mellitus without complications: Secondary | ICD-10-CM

## 2014-08-26 LAB — COMPREHENSIVE METABOLIC PANEL
ALT: 23 U/L (ref 0–53)
AST: 23 U/L (ref 0–37)
Albumin: 4 g/dL (ref 3.5–5.2)
Alkaline Phosphatase: 57 U/L (ref 39–117)
BILIRUBIN TOTAL: 0.9 mg/dL (ref 0.2–1.2)
BUN: 17 mg/dL (ref 6–23)
CO2: 27 mEq/L (ref 19–32)
CREATININE: 1 mg/dL (ref 0.4–1.5)
Calcium: 8.9 mg/dL (ref 8.4–10.5)
Chloride: 107 mEq/L (ref 96–112)
GFR: 80.23 mL/min (ref >60.00)
Glucose, Bld: 125 mg/dL — ABNORMAL HIGH (ref 70–99)
POTASSIUM: 4.9 meq/L (ref 3.5–5.1)
Sodium: 140 mEq/L (ref 135–145)
Total Protein: 6.7 g/dL (ref 6.0–8.3)

## 2014-08-26 LAB — LIPID PANEL
CHOLESTEROL: 109 mg/dL (ref 0–200)
HDL: 34.9 mg/dL — ABNORMAL LOW (ref >39.00)
LDL CALC: 68 mg/dL (ref 0–99)
NonHDL: 74.1
TRIGLYCERIDES: 33 mg/dL (ref 0.0–149.0)
Total CHOL/HDL Ratio: 3
VLDL: 6.6 mg/dL (ref 0.0–40.0)

## 2014-08-26 LAB — MICROALBUMIN / CREATININE URINE RATIO
Creatinine,U: 189.1 mg/dL
Microalb Creat Ratio: 0.3 mg/g (ref 0.0–30.0)
Microalb, Ur: 0.6 mg/dL (ref 0.0–1.9)

## 2014-08-26 LAB — HM DIABETES FOOT EXAM: HM DIABETIC FOOT EXAM: NORMAL

## 2014-08-26 LAB — HEMOGLOBIN A1C: Hgb A1c MFr Bld: 6.2 % (ref 4.6–6.5)

## 2014-08-26 NOTE — Patient Instructions (Signed)
Labs today.   Follow up in 3 months.  

## 2014-08-26 NOTE — Progress Notes (Signed)
Subjective:    Patient ID: Richard Hardy, male    DOB: [birth year], 68 y.o.   MRN: [id number]  HPI 68YO male presents for follow up.  DM - BG well controlled, however did not bring record today. Compliant with medications.   Cardiologist recently decreased dose of statin by 1/2. No side effects noted.  No new concerns today.  No chest pain, palpitations, headache. BP reportedly well controlled, however did not bring record.  Review of Systems  Constitutional: Negative for fever, chills, activity change, appetite change, fatigue and unexpected weight change.  Eyes: Negative for visual disturbance.  Respiratory: Negative for cough and shortness of breath.   Cardiovascular: Negative for chest pain, palpitations and leg swelling.  Gastrointestinal: Negative for nausea, vomiting, abdominal pain, diarrhea, constipation and abdominal distention.  Genitourinary: Negative for dysuria, urgency and difficulty urinating.  Musculoskeletal: Negative for arthralgias and gait problem.  Skin: Negative for color change and rash.  Hematological: Negative for adenopathy.  Psychiatric/Behavioral: Negative for sleep disturbance and dysphoric mood. The patient is not nervous/anxious.        Objective:    BP 124/72  Pulse 48  Temp(Src) 98.2 F (36.8 C) (Oral)  Ht 5\' 10"  (1.778 m)  Wt 213 lb (96.616 kg)  BMI 30.56 kg/m2  SpO2 97% Physical Exam  Constitutional: He is oriented to person, place, and time. He appears well-developed and well-nourished. No distress.  HENT:  Head: Normocephalic and atraumatic.  Right Ear: External ear normal.  Left Ear: External ear normal.  Nose: Nose normal.  Mouth/Throat: Oropharynx is clear and moist. No oropharyngeal exudate.  Eyes: Conjunctivae and EOM are normal. Pupils are equal, round, and reactive to light. Right eye exhibits no discharge. Left eye exhibits no discharge. No scleral icterus.  Neck: Normal range of motion. Neck supple. No tracheal deviation  present. No thyromegaly present.  Cardiovascular: Normal rate, regular rhythm and normal heart sounds.  Exam reveals no gallop and no friction rub.   No murmur heard. Pulmonary/Chest: Effort normal and breath sounds normal. No accessory muscle usage. Not tachypneic. No respiratory distress. He has no decreased breath sounds. He has no wheezes. He has no rhonchi. He has no rales. He exhibits no tenderness.  Musculoskeletal: Normal range of motion. He exhibits no edema.  Lymphadenopathy:    He has no cervical adenopathy.  Neurological: He is alert and oriented to person, place, and time. No cranial nerve deficit. Coordination normal.  Skin: Skin is warm and dry. No rash noted. He is not diaphoretic. No erythema. No pallor.  Psychiatric: He has a normal mood and affect. His behavior is normal. Judgment and thought content normal.          Assessment & Plan:   Problem List Items Addressed This Visit     Unprioritized   Diabetes type 2, controlled - Primary     Will check A1c with labs today. Continue current medications. Foot exam normal today.    Relevant Orders      Comprehensive metabolic panel      Hemoglobin A1c      Lipid panel      Microalbumin / creatinine urine ratio   Hyperlipemia     Will check lipids with labs today. Continue Simvastatin.    Hypertension      BP Readings from Last 3 Encounters:  08/26/14 124/72  06/03/14 132/62  05/25/14 130/80   BP well controlled. Renal function with labs today.        Return in  about 3 months (around 11/25/2014) for Recheck of Diabetes.

## 2014-08-26 NOTE — Assessment & Plan Note (Signed)
Will check A1c with labs today. Continue current medications. Foot exam normal today.

## 2014-08-26 NOTE — Assessment & Plan Note (Signed)
Will check lipids with labs today. Continue Simvastatin. 

## 2014-08-26 NOTE — Progress Notes (Signed)
Pre visit review using our clinic review tool, if applicable. No additional management support is needed unless otherwise documented below in the visit note. 

## 2014-08-26 NOTE — Assessment & Plan Note (Signed)
BP Readings from Last 3 Encounters:  08/26/14 124/72  06/03/14 132/62  05/25/14 130/80   BP well controlled. Renal function with labs today.

## 2014-09-01 ENCOUNTER — Encounter: Payer: Self-pay | Admitting: *Deleted

## 2014-09-01 ENCOUNTER — Other Ambulatory Visit: Payer: Self-pay | Admitting: *Deleted

## 2014-09-01 DIAGNOSIS — E785 Hyperlipidemia, unspecified: Secondary | ICD-10-CM

## 2014-09-01 MED ORDER — SAXAGLIPTIN HCL 5 MG PO TABS: 5.0000 mg | ORAL_TABLET | Freq: Every day | ORAL | Status: DC

## 2014-09-01 MED ORDER — SIMVASTATIN 40 MG PO TABS: 40.0000 mg | ORAL_TABLET | Freq: Every day | ORAL | Status: DC

## 2014-09-26 DIAGNOSIS — L821 Other seborrheic keratosis: Secondary | ICD-10-CM | POA: Diagnosis not present

## 2014-09-26 DIAGNOSIS — L578 Other skin changes due to chronic exposure to nonionizing radiation: Secondary | ICD-10-CM | POA: Diagnosis not present

## 2014-09-26 DIAGNOSIS — D229 Melanocytic nevi, unspecified: Secondary | ICD-10-CM | POA: Diagnosis not present

## 2014-09-26 DIAGNOSIS — L820 Inflamed seborrheic keratosis: Secondary | ICD-10-CM | POA: Diagnosis not present

## 2014-09-26 DIAGNOSIS — Z1283 Encounter for screening for malignant neoplasm of skin: Secondary | ICD-10-CM | POA: Diagnosis not present

## 2015-02-01 ENCOUNTER — Other Ambulatory Visit: Payer: Self-pay | Admitting: Internal Medicine

## 2015-02-22 ENCOUNTER — Encounter: Payer: Self-pay | Admitting: Internal Medicine

## 2015-02-22 ENCOUNTER — Ambulatory Visit (INDEPENDENT_AMBULATORY_CARE_PROVIDER_SITE_OTHER): Payer: Medicare Other | Admitting: Internal Medicine

## 2015-02-22 VITALS — BP 120/74 | HR 122 | Temp 97.6°F | Ht 70.0 in | Wt 217.0 lb

## 2015-02-22 DIAGNOSIS — E119 Type 2 diabetes mellitus without complications: Secondary | ICD-10-CM

## 2015-02-22 DIAGNOSIS — I10 Essential (primary) hypertension: Secondary | ICD-10-CM

## 2015-02-22 DIAGNOSIS — E785 Hyperlipidemia, unspecified: Secondary | ICD-10-CM

## 2015-02-22 DIAGNOSIS — Z125 Encounter for screening for malignant neoplasm of prostate: Secondary | ICD-10-CM

## 2015-02-22 LAB — COMPREHENSIVE METABOLIC PANEL
ALK PHOS: 59 U/L (ref 39–117)
ALT: 25 U/L (ref 0–53)
AST: 23 U/L (ref 0–37)
Albumin: 4.3 g/dL (ref 3.5–5.2)
BUN: 18 mg/dL (ref 6–23)
CO2: 29 mEq/L (ref 19–32)
CREATININE: 1.01 mg/dL (ref 0.40–1.50)
Calcium: 9.2 mg/dL (ref 8.4–10.5)
Chloride: 107 mEq/L (ref 96–112)
GFR: 77.38 mL/min (ref >60.00)
Glucose, Bld: 121 mg/dL — ABNORMAL HIGH (ref 70–99)
Potassium: 4.9 mEq/L (ref 3.5–5.1)
SODIUM: 140 meq/L (ref 135–145)
TOTAL PROTEIN: 6.4 g/dL (ref 6.0–8.3)
Total Bilirubin: 0.8 mg/dL (ref 0.2–1.2)

## 2015-02-22 LAB — HEMOGLOBIN A1C: Hgb A1c MFr Bld: 6.6 % — ABNORMAL HIGH (ref 4.6–6.5)

## 2015-02-22 LAB — LIPID PANEL
CHOLESTEROL: 114 mg/dL (ref 0–200)
HDL: 41.7 mg/dL (ref >39.00)
LDL CALC: 63 mg/dL (ref 0–99)
NONHDL: 72.3
Total CHOL/HDL Ratio: 3
Triglycerides: 47 mg/dL (ref 0.0–149.0)
VLDL: 9.4 mg/dL (ref 0.0–40.0)

## 2015-02-22 LAB — MICROALBUMIN / CREATININE URINE RATIO
Creatinine,U: 179.9 mg/dL
Microalb Creat Ratio: 0.5 mg/g (ref 0.0–30.0)
Microalb, Ur: 0.9 mg/dL (ref 0.0–1.9)

## 2015-02-22 LAB — PSA, MEDICARE: PSA: 0.26 ng/ml (ref 0.10–4.00)

## 2015-02-22 MED ORDER — LOSARTAN POTASSIUM 25 MG PO TABS: 25.0000 mg | ORAL_TABLET | Freq: Every day | ORAL | Status: DC

## 2015-02-22 NOTE — Assessment & Plan Note (Signed)
Discussed benefits and limitations of PSA testing. Will check PSA with labs.

## 2015-02-22 NOTE — Progress Notes (Signed)
Subjective:    Patient ID: Richard Hardy, male    DOB: [birth year], 69 y.o.   MRN: [id number]  HPI  69YO male presents for follow up.  DM - BG have been 111-128 recently. Compliant with medication.  Generally feeling well. Occasionally, has to wake up at night to urinate on occasion, but notes increased consumption of fluids during day, including 3 cups of coffee. No decrease in urine stream.   Wt Readings from Last 3 Encounters:  02/22/15 217 lb (98.431 kg)  08/26/14 213 lb (96.616 kg)  06/03/14 217 lb 8 oz (98.657 kg)   BP Readings from Last 3 Encounters:  02/22/15 120/74  08/26/14 124/72  06/03/14 132/62     Past medical, surgical, family and social history per today's encounter.  Review of Systems  Constitutional: Negative for fever, chills, activity change, appetite change, fatigue and unexpected weight change.  Eyes: Negative for visual disturbance.  Respiratory: Negative for cough and shortness of breath.   Cardiovascular: Negative for chest pain, palpitations and leg swelling.  Gastrointestinal: Negative for nausea, vomiting, abdominal pain, diarrhea, constipation and abdominal distention.  Genitourinary: Negative for dysuria, urgency, frequency and difficulty urinating.  Musculoskeletal: Negative for arthralgias and gait problem.  Skin: Negative for color change and rash.  Hematological: Negative for adenopathy.  Psychiatric/Behavioral: Negative for sleep disturbance and dysphoric mood. The patient is not nervous/anxious.        Objective:    BP 120/74 mmHg  Pulse 122  Temp(Src) 97.6 F (36.4 C) (Oral)  Ht 5\' 10"  (1.778 m)  Wt 217 lb (98.431 kg)  BMI 31.14 kg/m2  SpO2 98% Physical Exam  Constitutional: He is oriented to person, place, and time. He appears well-developed and well-nourished. No distress.  HENT:  Head: Normocephalic and atraumatic.  Right Ear: External ear normal.  Left Ear: External ear normal.  Nose: Nose normal.  Mouth/Throat:  Oropharynx is clear and moist. No oropharyngeal exudate.  Eyes: Conjunctivae and EOM are normal. Pupils are equal, round, and reactive to light. Right eye exhibits no discharge. Left eye exhibits no discharge. No scleral icterus.  Neck: Normal range of motion. Neck supple. No tracheal deviation present. No thyromegaly present.  Cardiovascular: Normal rate, regular rhythm and normal heart sounds.  Exam reveals no gallop and no friction rub.   No murmur heard. Pulmonary/Chest: Effort normal and breath sounds normal. No accessory muscle usage. No tachypnea. No respiratory distress. He has no decreased breath sounds. He has no wheezes. He has no rhonchi. He has no rales. He exhibits no tenderness.  Musculoskeletal: Normal range of motion. He exhibits no edema.  Lymphadenopathy:    He has no cervical adenopathy.  Neurological: He is alert and oriented to person, place, and time. No cranial nerve deficit. Coordination normal.  Skin: Skin is warm and dry. No rash noted. He is not diaphoretic. No erythema. No pallor.  Psychiatric: He has a normal mood and affect. His behavior is normal. Judgment and thought content normal.          Assessment & Plan:   Problem List Items Addressed This Visit      Unprioritized   Diabetes type 2, controlled - Primary    Will check A1c with labs. Continue Onglyza.      Relevant Medications   losartan (COZAAR) tablet   Other Relevant Orders   Comprehensive metabolic panel   Hemoglobin A1c   Lipid panel   Microalbumin / creatinine urine ratio   Hyperlipemia    Will  check Lipids and LFTS with labs. Continue Simvastatin.      Relevant Medications   losartan (COZAAR) tablet   Hypertension    BP Readings from Last 3 Encounters:  02/22/15 120/74  08/26/14 124/72  06/03/14 132/62   BP well controlled. Will check renal function with labs. Continue Losartan.      Relevant Medications   losartan (COZAAR) tablet   Screening for prostate cancer     Discussed benefits and limitations of PSA testing. Will check PSA with labs.      Relevant Orders   PSA, Medicare       Return in about 6 months (around 08/25/2015) for Wellness Visit.

## 2015-02-22 NOTE — Patient Instructions (Signed)
Labs today.  Follow up in 6 months. 

## 2015-02-22 NOTE — Progress Notes (Signed)
Pre visit review using our clinic review tool, if applicable. No additional management support is needed unless otherwise documented below in the visit note. 

## 2015-02-22 NOTE — Assessment & Plan Note (Signed)
BP Readings from Last 3 Encounters:  02/22/15 120/74  08/26/14 124/72  06/03/14 132/62   BP well controlled. Will check renal function with labs. Continue Losartan.

## 2015-02-22 NOTE — Assessment & Plan Note (Signed)
Will check Lipids and LFTS with labs. Continue Simvastatin.

## 2015-02-22 NOTE — Assessment & Plan Note (Signed)
Will check A1c with labs. Continue Onglyza. 

## 2015-02-23 ENCOUNTER — Encounter: Payer: Self-pay | Admitting: *Deleted

## 2015-03-17 ENCOUNTER — Telehealth: Payer: Self-pay | Admitting: Internal Medicine

## 2015-03-17 NOTE — Telephone Encounter (Signed)
I agree that he needs to be seen. If he cannot leave work today, then may be seen in Saturday clinic or we can see him next week.

## 2015-03-17 NOTE — Telephone Encounter (Signed)
[facility]  Patient Name: Richard Hardy. Batchelder  DOB: 1946/04/20    Initial Comment Caller States he is having a lot of frequent urination especially in the morning, even sometimes it gives him the shakes. feels an irritation under his genitals. he just recently had a PSA and it came back normal.    Nurse Assessment  Nurse: Wynetta Emery, RN, Baker Janus Date/Time Eilene Ghazi Time): 03/17/2015 9:10:09 AM  Confirm and document reason for call. If symptomatic, describe symptoms. ---[name] has frequent urination in the am and sometimes gets the shakes - also has discomfort in prostate - wonders if this is UA or prostate problems even though PSA is WNL  Has the patient traveled out of the country within the last 30 days? ---No  Does the patient require triage? ---Yes  Related visit to physician within the last 2 weeks? ---No  Does the PT have any chronic conditions? (i.e. diabetes, asthma, etc.) ---Yes  List chronic conditions. ---Diabetes Hyperlipidemia     Guidelines    Guideline Title Affirmed Question Affirmed Notes  Urinary Symptoms Urinating more frequently than usual (i.e., frequency)    Final Disposition User   See Physician within 24 Hours Donora, RN, Baker Janus    Comments  Did not want appt today is on the job and can't get off states this has been an ongoing thing and can wait until next week will call next week and make appt.

## 2015-03-20 ENCOUNTER — Ambulatory Visit (INDEPENDENT_AMBULATORY_CARE_PROVIDER_SITE_OTHER): Payer: Medicare Other | Admitting: Internal Medicine

## 2015-03-20 ENCOUNTER — Encounter: Payer: Self-pay | Admitting: Internal Medicine

## 2015-03-20 VITALS — BP 136/87 | HR 74 | Temp 98.2°F | Ht 70.0 in | Wt 216.4 lb

## 2015-03-20 DIAGNOSIS — N4281 Prostatodynia syndrome: Secondary | ICD-10-CM | POA: Diagnosis not present

## 2015-03-20 DIAGNOSIS — N390 Urinary tract infection, site not specified: Secondary | ICD-10-CM | POA: Diagnosis not present

## 2015-03-20 LAB — POCT URINALYSIS DIPSTICK
Bilirubin, UA: NEGATIVE
Glucose, UA: NEGATIVE
KETONES UA: NEGATIVE
LEUKOCYTES UA: NEGATIVE
Nitrite, UA: NEGATIVE
PROTEIN UA: NEGATIVE
RBC UA: NEGATIVE
SPEC GRAV UA: 1.025
UROBILINOGEN UA: 0.2
pH, UA: 5

## 2015-03-20 MED ORDER — CIPROFLOXACIN HCL 500 MG PO TABS: 500.0000 mg | ORAL_TABLET | Freq: Two times a day (BID) | ORAL | Status: DC

## 2015-03-20 NOTE — Patient Instructions (Addendum)
Start Cipro 500mg  twice daily to help with infection in the prostate.  Follow up next week for recheck or sooner as needed.  Prostatitis The prostate gland is about the size and shape of a walnut. It is located just below your bladder. It produces one of the components of semen, which is made up of sperm and the fluids that help nourish and transport it out from the testicles. Prostatitis is inflammation of the prostate gland.  There are four types of prostatitis:  Acute bacterial prostatitis. This is the least common type of prostatitis. It starts quickly and usually is associated with a bladder infection, high fever, and shaking chills. It can occur at any age.  Chronic bacterial prostatitis. This is a persistent bacterial infection in the prostate. It usually develops from repeated acute bacterial prostatitis or acute bacterial prostatitis that was not properly treated. It can occur in men of any age but is most common in middle-aged men whose prostate has begun to enlarge. The symptoms are not as severe as those in acute bacterial prostatitis. Discomfort in the part of your body that is in front of your rectum and below your scrotum (perineum), lower abdomen, or in the head of your penis (glans) may represent your primary discomfort.  Chronic prostatitis (nonbacterial). This is the most common type of prostatitis. It is inflammation of the prostate gland that is not caused by a bacterial infection. The cause is unknown and may be associated with a viral infection or autoimmune disorder.  Prostatodynia (pelvic floor disorder). This is associated with increased muscular tone in the pelvis surrounding the prostate. CAUSES The causes of bacterial prostatitis are bacterial infection. The causes of the other types of prostatitis are unknown.  SYMPTOMS  Symptoms can vary depending upon the type of prostatitis that exists. There can also be overlap in symptoms. Possible symptoms for each type of  prostatitis are listed below. Acute Bacterial Prostatitis  Painful urination.  Fever or chills.  Muscle or joint pains.  Low back pain.  Low abdominal pain.  Inability to empty bladder completely. Chronic Bacterial Prostatitis, Chronic Nonbacterial Prostatitis, and Prostatodynia  Sudden urge to urinate.  Frequent urination.  Difficulty starting urine stream.  Weak urine stream.  Discharge from the urethra.  Dribbling after urination.  Rectal pain.  Pain in the testicles, penis, or tip of the penis.  Pain in the perineum.  Problems with sexual function.  Painful ejaculation.  Bloody semen. DIAGNOSIS  In order to diagnose prostatitis, your health care provider will ask about your symptoms. One or more urine samples will be taken and tested (urinalysis). If the urinalysis result is negative for bacteria, your health care provider may use a finger to feel your prostate (digital rectal exam). This exam helps your health care provider determine if your prostate is swollen and tender. It will also produce a specimen of semen that can be analyzed. TREATMENT  Treatment for prostatitis depends on the cause. If a bacterial infection is the cause, it can be treated with antibiotic medicine. In cases of chronic bacterial prostatitis, the use of antibiotics for up to 1 month or 6 weeks may be necessary. Your health care provider may instruct you to take sitz baths to help relieve pain. A sitz bath is a bath of hot water in which your hips and buttocks are under water. This relaxes the pelvic floor muscles and often helps to relieve the pressure on your prostate. HOME CARE INSTRUCTIONS   Take all medicines as directed  by your health care provider.  Take sitz baths as directed by your health care provider. SEEK MEDICAL CARE IF:   Your symptoms get worse, not better.  You have a fever. SEEK IMMEDIATE MEDICAL CARE IF:   You have chills.  You feel nauseous or vomit.  You feel  lightheaded or faint.  You are unable to urinate.  You have blood or blood clots in your urine. MAKE SURE YOU:  Understand these instructions.  Will watch your condition.  Will get help right away if you are not doing well or get worse. Document Released: 11/22/2000 Document Revised: 11/30/2013 Document Reviewed: 06/14/2013 [facility] Patient Information 2015 [place], [place]. This information is not intended to replace advice given to you by your health care provider. Make sure you discuss any questions you have with your health care provider.

## 2015-03-20 NOTE — Assessment & Plan Note (Signed)
One week of dysuria and prostatalgia. Symptoms are improving and there is no significant tenderness on prostate exam today. Recent PSA normal. Initial presentation is most consistent with prostatitis. Will start Cipro. Send urine for culture. Recheck next week. If symptoms are not improving, then discussed referral for [place] prostate.

## 2015-03-20 NOTE — Progress Notes (Signed)
   Subjective:    Patient ID: Richard Hardy, male    DOB: 07-28-1946, 69 y.o.   MRN: [id number]  HPI  69YO male presents for acute visit.  Over last week, had burning pain with urination with some increased frequency of urination. Seems to be improving over the last week. Feels some pain or pressure in "prostate area" under testicles at times. No fever, chills. Urine stream seems smaller than normal at times, but also improving this week. PSA in 02/2015 was normal.   Past medical, surgical, family and social history per today's encounter.  Review of Systems  Constitutional: Negative for fever, chills, activity change, appetite change, fatigue and unexpected weight change.  Eyes: Negative for visual disturbance.  Respiratory: Negative for cough and shortness of breath.   Cardiovascular: Negative for chest pain, palpitations and leg swelling.  Gastrointestinal: Negative for abdominal pain and abdominal distention.  Genitourinary: Positive for dysuria, frequency and difficulty urinating. Negative for urgency, hematuria, discharge, penile swelling, scrotal swelling, penile pain and testicular pain.  Musculoskeletal: Negative for arthralgias and gait problem.  Skin: Negative for color change and rash.  Hematological: Negative for adenopathy.  Psychiatric/Behavioral: Negative for sleep disturbance and dysphoric mood. The patient is not nervous/anxious.        Objective:    BP 136/87 mmHg  Pulse 74  Temp(Src) 98.2 F (36.8 C) (Oral)  Ht 5\' 10"  (1.778 m)  Wt 216 lb 6 oz (98.147 kg)  BMI 31.05 kg/m2  SpO2 94% Physical Exam  Constitutional: He is oriented to person, place, and time. He appears well-developed and well-nourished.  HENT:  Head: Normocephalic.  Eyes: Conjunctivae and EOM are normal. Pupils are equal, round, and reactive to light.  Neck: Normal range of motion.  Pulmonary/Chest: Effort normal.  Genitourinary: Rectum normal, prostate normal, testes normal and penis  normal. Rectal exam shows no external hemorrhoid and anal tone normal. Prostate is not enlarged and not tender. Right testis shows no mass, no swelling and no tenderness. Left testis shows no mass, no swelling and no tenderness. Uncircumcised.  Musculoskeletal: Normal range of motion.  Neurological: He is alert and oriented to person, place, and time. He has normal reflexes. He displays normal reflexes. No cranial nerve deficit. Coordination normal.  Skin: Skin is warm and dry.  Psychiatric: He has a normal mood and affect. His behavior is normal. Judgment and thought content normal.          Assessment & Plan:   Problem List Items Addressed This Visit      Unprioritized   Prostatalgia - Primary    One week of dysuria and prostatalgia. Symptoms are improving and there is no significant tenderness on prostate exam today. Recent PSA normal. Initial presentation is most consistent with prostatitis. Will start Cipro. Send urine for culture. Recheck next week. If symptoms are not improving, then discussed referral for [place] prostate.      Relevant Medications   ciprofloxacin (CIPRO) tablet   Other Relevant Orders   Urine culture    Other Visit Diagnoses    Urinary tract infection without hematuria, site unspecified        Relevant Orders    POCT urinalysis dipstick (Completed)    Urine culture        Return in about 1 week (around 03/27/2015) for Recheck.

## 2015-03-20 NOTE — Progress Notes (Signed)
Pre visit review using our clinic review tool, if applicable. No additional management support is needed unless otherwise documented below in the visit note. 

## 2015-03-22 LAB — URINE CULTURE
COLONY COUNT: NO GROWTH
ORGANISM ID, BACTERIA: NO GROWTH

## 2015-03-27 ENCOUNTER — Ambulatory Visit (INDEPENDENT_AMBULATORY_CARE_PROVIDER_SITE_OTHER): Payer: Medicare Other | Admitting: Internal Medicine

## 2015-03-27 ENCOUNTER — Encounter: Payer: Self-pay | Admitting: Internal Medicine

## 2015-03-27 VITALS — BP 118/68 | HR 56 | Temp 98.1°F | Ht 70.0 in | Wt 218.4 lb

## 2015-03-27 DIAGNOSIS — N4281 Prostatodynia syndrome: Secondary | ICD-10-CM | POA: Diagnosis not present

## 2015-03-27 NOTE — Progress Notes (Signed)
Pre visit review using our clinic review tool, if applicable. No additional management support is needed unless otherwise documented below in the visit note. 

## 2015-03-27 NOTE — Assessment & Plan Note (Signed)
Symptoms have resolved with no further pain, pressure or urinary symptoms. Will finish course of Cipro. Follow up prn if any recurrent symptoms.

## 2015-03-27 NOTE — Patient Instructions (Signed)
Please call if any recurrent symptoms.

## 2015-03-27 NOTE — Progress Notes (Signed)
   Subjective:    Patient ID: Richard Hardy, male    DOB: 03-10-1946, 69 y.o.   MRN: [id number]  HPI  69YO male presents for follow up.  Last seen 4/11 for prostatalgia. Treated for prostatitis with Cipro.  Symptoms have improved. No urinary hesitancy. No further pain, pressure. No new concerns today.   Past medical, surgical, family and social history per today's encounter.  Review of Systems  Constitutional: Negative for fever, chills, activity change, appetite change, fatigue and unexpected weight change.  Eyes: Negative for visual disturbance.  Respiratory: Negative for cough and shortness of breath.   Cardiovascular: Negative for chest pain, palpitations and leg swelling.  Gastrointestinal: Negative for abdominal pain and abdominal distention.  Genitourinary: Negative for dysuria, urgency, frequency, hematuria, flank pain, discharge, penile swelling, scrotal swelling, difficulty urinating, genital sores, penile pain and testicular pain.  Musculoskeletal: Negative for arthralgias and gait problem.  Skin: Negative for color change and rash.  Hematological: Negative for adenopathy.  Psychiatric/Behavioral: Negative for sleep disturbance and dysphoric mood. The patient is not nervous/anxious.        Objective:    BP 118/68 mmHg  Pulse 56  Temp(Src) 98.1 F (36.7 C) (Oral)  Ht 5\' 10"  (1.778 m)  Wt 218 lb 6 oz (99.054 kg)  BMI 31.33 kg/m2  SpO2 96% Physical Exam  Constitutional: He is oriented to person, place, and time. He appears well-developed and well-nourished. No distress.  HENT:  Head: Normocephalic and atraumatic.  Right Ear: External ear normal.  Left Ear: External ear normal.  Nose: Nose normal.  Mouth/Throat: Oropharynx is clear and moist. No oropharyngeal exudate.  Eyes: Conjunctivae and EOM are normal. Pupils are equal, round, and reactive to light. Right eye exhibits no discharge. Left eye exhibits no discharge. No scleral icterus.  Neck: Normal range of  motion. Neck supple. No tracheal deviation present. No thyromegaly present.  Cardiovascular: Normal rate, regular rhythm and normal heart sounds.  Exam reveals no gallop and no friction rub.   No murmur heard. Pulmonary/Chest: Effort normal and breath sounds normal. No accessory muscle usage. No tachypnea. No respiratory distress. He has no decreased breath sounds. He has no wheezes. He has no rhonchi. He has no rales. He exhibits no tenderness.  Musculoskeletal: Normal range of motion. He exhibits no edema.  Lymphadenopathy:    He has no cervical adenopathy.  Neurological: He is alert and oriented to person, place, and time. No cranial nerve deficit. Coordination normal.  Skin: Skin is warm and dry. No rash noted. He is not diaphoretic. No erythema. No pallor.  Psychiatric: He has a normal mood and affect. His behavior is normal. Judgment and thought content normal.          Assessment & Plan:   Problem List Items Addressed This Visit      Unprioritized   Prostatalgia - Primary    Symptoms have resolved with no further pain, pressure or urinary symptoms. Will finish course of Cipro. Follow up prn if any recurrent symptoms.          Return if symptoms worsen or fail to improve.

## 2015-05-09 ENCOUNTER — Other Ambulatory Visit: Payer: Self-pay | Admitting: Internal Medicine

## 2015-06-05 ENCOUNTER — Encounter: Payer: Self-pay | Admitting: Cardiovascular Disease

## 2015-06-05 ENCOUNTER — Ambulatory Visit (INDEPENDENT_AMBULATORY_CARE_PROVIDER_SITE_OTHER): Payer: Medicare Other | Admitting: Cardiovascular Disease

## 2015-06-05 VITALS — BP 120/68 | HR 53 | Ht 70.0 in | Wt 216.5 lb

## 2015-06-05 DIAGNOSIS — E785 Hyperlipidemia, unspecified: Secondary | ICD-10-CM | POA: Diagnosis not present

## 2015-06-05 DIAGNOSIS — I10 Essential (primary) hypertension: Secondary | ICD-10-CM | POA: Diagnosis not present

## 2015-06-05 DIAGNOSIS — E119 Type 2 diabetes mellitus without complications: Secondary | ICD-10-CM | POA: Diagnosis not present

## 2015-06-05 DIAGNOSIS — I471 Supraventricular tachycardia, unspecified: Secondary | ICD-10-CM

## 2015-06-05 NOTE — Assessment & Plan Note (Signed)
Blood pressure is well controlled on today's visit. No changes made to the medications. 

## 2015-06-05 NOTE — Patient Instructions (Signed)
You are doing well. No medication changes were made.  Please call us if you have new issues that need to be addressed before your next appt.  Your physician wants you to follow-up in: 12 months.  You will receive a reminder letter in the mail two months in advance. If you don't receive a letter, please call our office to schedule the follow-up appointment. 

## 2015-06-05 NOTE — Assessment & Plan Note (Signed)
We have encouraged continued exercise, careful diet management in an effort to lose weight. 

## 2015-06-05 NOTE — Progress Notes (Signed)
Patient ID: Richard Hardy, male    DOB: 1946-10-14, 69 y.o.   MRN: [id number]  HPI Comments: Richard Hardy is a  69 year old male with a history of  hyperlipidemia, glucose intolerance, obstructive sleep apnea on CPAP, Borderline diabetes,  and a strong family history of coronary artery disease who returns for routine followup of his hyperlipidemia. 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       overall he states that he has been doing well. He denies any significant lightheadedness or dizziness. Continues to work for [employer],  He reports that he walks a significant distance daily No symptoms of chest pain or shortness of breath with exertion. Hemoglobin A1c 6.6, total cholesterol less than 130 Doing well on onglyza. Has not been restricting his diet  EKG on today's visit shows sinus rhythm with rate 53 bpm, no significant ST or T-wave changes Heart rate typically runs in the 50s  Other past medical history  seen by Dr. Grayland Ormond of hematology oncology for prior drop in his platelets. Aspirin was held through this.     Myoview in 2009 which showed an EF of 58% with no evidence of ischemia or infarct.     Previous  ABIs which were normal at 1.3 on the right and 1.2 on the left.     Allergies  Allergen Reactions  . Lisinopril     cough  . Metformin And Related     Stomach aches  . Sulfonamide Derivatives     REACTION: hives    Current Outpatient Prescriptions on File Prior to Visit  Medication Sig Dispense Refill  . Coenzyme Q10 (COQ10) 200 MG CAPS Take 1 tablet by mouth daily.      . Fish Oil OIL Take by mouth daily.      . fluticasone (FLONASE) 50 MCG/ACT nasal spray Place into the nose at bedtime.      . Glucosamine-Chondroit-Vit C-Mn (GLUCOSAMINE-CHONDROITIN) CAPS Take 2 capsules by mouth daily.      Marland Kitchen glucose blood (BAYER CONTOUR TEST) test strip Check blood sugar 1-2 times daily dx: 250.00 100 each 12  . losartan (COZAAR) 25 MG tablet Take 1 tablet (25 mg  total) by mouth daily. 90 tablet 3  . Misc Natural Products (OSTEO BI-FLEX TRIPLE STRENGTH PO) Take by mouth daily.    . Nutritional Supplements (JUICE PLUS FIBRE) LIQD Take 2 tablets by mouth 2 (two) times daily.      . saxagliptin HCl (ONGLYZA) 5 MG TABS tablet Take 1 tablet (5 mg total) by mouth daily. 90 tablet 1  . simvastatin (ZOCOR) 40 MG tablet Take 1 tablet (40 mg total) by mouth daily at 6 PM. 90 tablet 1  . vitamin C (ASCORBIC ACID) 500 MG tablet Take 500 mg by mouth daily.    . vitamin E (VITAMIN E) 400 UNIT capsule Take 400 Units by mouth daily.     No current facility-administered medications on file prior to visit.    Past Medical History  Diagnosis Date  . Obesity   . Glucose intolerance (impaired glucose tolerance)   . Hyperlipidemia   . Sleep apnea     On BiPAP, Dr. Richardson Landry  . Thrombocythemia     Dr. Grayland Ormond  . Diabetes mellitus without complication 7425    Past Surgical History  Procedure Laterality Date  . Cataract extraction, bilateral    . Lithotripsy      Dr. Bernardo Heater  . Nasal septum surgery      Social History  reports that he quit smoking about 51 years ago. He does not have any smokeless tobacco history on file. He reports that he drinks alcohol. He reports that he does not use illicit drugs.  Family History family history includes Cancer in his mother; Crohn's disease in his brother; Heart attack (age of onset: 83) in his father; Heart disease in his brother, father, and paternal grandfather; Hyperlipidemia in his son; Hypertension in his son; Sleep apnea in his son; Thyroid disease in his son.   Review of Systems  Constitutional: Negative.   Respiratory: Negative.   Cardiovascular: Negative.   Gastrointestinal: Negative.   Musculoskeletal: Negative.   Neurological: Negative.   Hematological: Negative.   Psychiatric/Behavioral: Negative.   All other systems reviewed and are negative.   BP 120/68 mmHg  Pulse 53  Ht 5\' 10"  (1.778 m)  Wt  216 lb 8 oz (98.204 kg)  BMI 31.06 kg/m2  Physical Exam  Constitutional: He is oriented to person, place, and time. He appears well-developed and well-nourished.  HENT:  Head: Normocephalic.  Nose: Nose normal.  Mouth/Throat: Oropharynx is clear and moist.  Eyes: Conjunctivae are normal. Pupils are equal, round, and reactive to light.  Neck: Normal range of motion. Neck supple. No JVD present.  Cardiovascular: Normal rate, regular rhythm, S1 normal, S2 normal, normal heart sounds and intact distal pulses.  Exam reveals no gallop and no friction rub.   No murmur heard. Pulmonary/Chest: Effort normal and breath sounds normal. No respiratory distress. He has no wheezes. He has no rales. He exhibits no tenderness.  Abdominal: Soft. Bowel sounds are normal. He exhibits no distension. There is no tenderness.  Musculoskeletal: Normal range of motion. He exhibits no edema or tenderness.  Lymphadenopathy:    He has no cervical adenopathy.  Neurological: He is alert and oriented to person, place, and time. Coordination normal.  Skin: Skin is warm and dry. No rash noted. No erythema.  Psychiatric: He has a normal mood and affect. His behavior is normal. Judgment and thought content normal.      Assessment and Plan   Nursing note and vitals reviewed.

## 2015-06-05 NOTE — Assessment & Plan Note (Signed)
Cholesterol is at goal on the current lipid regimen. No changes to the medications were made.  

## 2015-08-29 ENCOUNTER — Encounter: Payer: Self-pay | Admitting: Internal Medicine

## 2015-08-29 ENCOUNTER — Ambulatory Visit (INDEPENDENT_AMBULATORY_CARE_PROVIDER_SITE_OTHER): Payer: Medicare Other | Admitting: Internal Medicine

## 2015-08-29 VITALS — BP 153/78 | HR 52 | Temp 97.8°F | Ht 69.5 in | Wt 215.2 lb

## 2015-08-29 DIAGNOSIS — Z0000 Encounter for general adult medical examination without abnormal findings: Secondary | ICD-10-CM | POA: Diagnosis not present

## 2015-08-29 DIAGNOSIS — I10 Essential (primary) hypertension: Secondary | ICD-10-CM | POA: Diagnosis not present

## 2015-08-29 DIAGNOSIS — Z125 Encounter for screening for malignant neoplasm of prostate: Secondary | ICD-10-CM

## 2015-08-29 DIAGNOSIS — E119 Type 2 diabetes mellitus without complications: Secondary | ICD-10-CM | POA: Diagnosis not present

## 2015-08-29 DIAGNOSIS — Z23 Encounter for immunization: Secondary | ICD-10-CM | POA: Diagnosis not present

## 2015-08-29 LAB — COMPREHENSIVE METABOLIC PANEL
ALT: 23 U/L (ref 0–53)
AST: 18 U/L (ref 0–37)
Albumin: 4.1 g/dL (ref 3.5–5.2)
Alkaline Phosphatase: 60 U/L (ref 39–117)
BUN: 21 mg/dL (ref 6–23)
CO2: 29 mEq/L (ref 19–32)
Calcium: 9.2 mg/dL (ref 8.4–10.5)
Chloride: 106 mEq/L (ref 96–112)
Creatinine, Ser: 0.94 mg/dL (ref 0.40–1.50)
GFR: 83.94 mL/min (ref >60.00)
GLUCOSE: 123 mg/dL — AB (ref 70–99)
POTASSIUM: 5.1 meq/L (ref 3.5–5.1)
SODIUM: 142 meq/L (ref 135–145)
Total Bilirubin: 0.7 mg/dL (ref 0.2–1.2)
Total Protein: 6.7 g/dL (ref 6.0–8.3)

## 2015-08-29 LAB — LIPID PANEL
CHOL/HDL RATIO: 3
Cholesterol: 114 mg/dL (ref 0–200)
HDL: 41.7 mg/dL (ref >39.00)
LDL Cholesterol: 61 mg/dL (ref 0–99)
NONHDL: 71.96
Triglycerides: 54 mg/dL (ref 0.0–149.0)
VLDL: 10.8 mg/dL (ref 0.0–40.0)

## 2015-08-29 LAB — CBC WITH DIFFERENTIAL/PLATELET
Basophils Absolute: 0 10*3/uL (ref 0.0–0.1)
Basophils Relative: 0.4 % (ref 0.0–3.0)
EOS PCT: 3.3 % (ref 0.0–5.0)
Eosinophils Absolute: 0.2 10*3/uL (ref 0.0–0.7)
HCT: 45.3 % (ref 39.0–52.0)
Hemoglobin: 15.6 g/dL (ref 13.0–17.0)
LYMPHS ABS: 1.6 10*3/uL (ref 0.7–4.0)
Lymphocytes Relative: 28.3 % (ref 12.0–46.0)
MCHC: 34.4 g/dL (ref 30.0–36.0)
MCV: 95.9 fl (ref 78.0–100.0)
MONOS PCT: 8.1 % (ref 3.0–12.0)
Monocytes Absolute: 0.5 10*3/uL (ref 0.1–1.0)
NEUTROS ABS: 3.4 10*3/uL (ref 1.4–7.7)
NEUTROS PCT: 59.9 % (ref 43.0–77.0)
PLATELETS: 167 10*3/uL (ref 150.0–400.0)
RBC: 4.73 Mil/uL (ref 4.22–5.81)
RDW: 13.6 % (ref 11.5–15.5)
WBC: 5.7 10*3/uL (ref 4.0–10.5)

## 2015-08-29 LAB — PSA, MEDICARE: PSA: 0.27 ng/ml (ref 0.10–4.00)

## 2015-08-29 LAB — HEMOGLOBIN A1C: Hgb A1c MFr Bld: 6.4 % (ref 4.6–6.5)

## 2015-08-29 NOTE — Patient Instructions (Signed)

## 2015-08-29 NOTE — Progress Notes (Signed)
The patient is here for annual Medicare Wellness Examination and management of other chronic and acute problems.   The risk factors are reflected in the history.  The roster of all physicians providing medical care to patient - is listed in the Snapshot section of the chart.  Activities of daily living:   The patient is 100% independent in all ADLs: dressing, toileting, feeding as well as independent mobility. Patient lives with wife. No pets in home. Two story home.   Home safety :  The patient has smoke detectors in the home.  They wear seatbelts in their car. There are no firearms at home.  There is no violence in the home. They feel safe where they live.  Infectious Risks: There is no risks for hepatitis, STDs or HIV.  There is no  history of blood transfusion.  They have no travel history to infectious disease endemic areas of the world.  Additional Health Care Providers: The patient has seen their dentist in the last six months. Dentist - Dr. Hilliard Clark They have seen their eye doctor in the last year. Cataract surgery bilaterally in past. Opthalmologist - Dr. George Ina They deny hearing issues. They have deferred audiologic testing in the last year.   They do not  have excessive sun exposure. Discussed the need for sun protection: hats,long sleeves and use of sunscreen if there is significant sun exposure.  Dermatologist - Dr. Nehemiah Massed  Diet: the importance of a healthy diet is discussed. They do have a healthy diet.  The benefits of regular aerobic exercise were discussed. Patient exercises by staying active all day at work. Also plays golf.  Depression screen: there are no signs or vegative symptoms of depression- irritability, change in appetite, anhedonia, sadness/tearfullness.  Cognitive assessment: the patient manages all their financial and personal affairs and is actively engaged. They could relate day,date,year and events; recalled 2/3 objects at 3 minutes; performed  clock-face test normally.  HCPOA - yes, in place Living Will - yes  The following portions of the patient's history were reviewed and updated as appropriate: allergies, current medications, past family history, past medical history,  past surgical history, past social history and problem list.  Visual acuity was not assessed per patient preference as they have regular follow up with their ophthalmologist. Hearing and body mass index were assessed and reviewed.   During the course of the visit the patient was educated and counseled about appropriate screening and preventive services including : fall prevention , diabetes screening, nutrition counseling, colorectal cancer screening, and recommended immunizations.    BP Readings from Last 3 Encounters:  08/29/15 153/78  06/05/15 120/68  03/27/15 118/68   Wt Readings from Last 3 Encounters:  08/29/15 215 lb 4 oz (97.637 kg)  06/05/15 216 lb 8 oz (98.204 kg)  03/27/15 218 lb 6 oz (99.054 kg)     Past Medical History  Diagnosis Date  . Obesity   . Glucose intolerance (impaired glucose tolerance)   . Hyperlipidemia   . Sleep apnea     On BiPAP, Dr. Richardson Landry  . Thrombocythemia     Dr. Grayland Ormond  . Diabetes mellitus without complication 2376   Family History  Problem Relation Age of Onset  . Cancer Mother     brain  . Heart attack Father 59  . Heart disease Father   . Heart disease Brother     s/p CABG  . Crohn's disease Brother   . Hyperlipidemia Son   . Hypertension Son   .  Sleep apnea Son   . Thyroid disease Son   . Heart disease Paternal Grandfather    Social History   Social History  . Marital Status: Married    Spouse Name: N/A  . Number of Children: N/A  . Years of Education: N/A   Social History Main Topics  . Smoking status: Former Smoker -- 0.50 packs/day for 4 years    Quit date: 12/10/1963  . Smokeless tobacco: None  . Alcohol Use: Yes     Comment: Occasional  . Drug Use: No  . Sexual Activity: Not  Asked   Other Topics Concern  . None   Social History Narrative   Lives in [place] with wife. 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 Cataract extraction, bilateral    . Lithotripsy      Dr. Bernardo Heater  . Nasal septum surgery      Review of Systems  Constitutional: Negative for fever, chills, activity change, appetite change, fatigue and unexpected weight change.  Eyes: Negative for visual disturbance.  Respiratory: Negative for cough and shortness of breath.   Cardiovascular: Negative for chest pain, palpitations and leg swelling.  Gastrointestinal: Negative for nausea, vomiting, abdominal pain, diarrhea, constipation and abdominal distention.  Genitourinary: Negative for dysuria, urgency and difficulty urinating.  Musculoskeletal: Negative for myalgias, arthralgias and gait problem.  Skin: Negative for color change and rash.  Hematological: Negative for adenopathy.  Psychiatric/Behavioral: Negative for sleep disturbance and dysphoric mood. The patient is not nervous/anxious.        Objective:    BP 153/78 mmHg  Pulse 52  Temp(Src) 97.8 F (36.6 C) (Oral)  Ht 5' 9.5" (1.765 m)  Wt 215 lb 4 oz (97.637 kg)  BMI 31.34 kg/m2  SpO2 96% Physical Exam  Constitutional: He is oriented to person, place, and time. He appears well-developed and well-nourished. No distress.  HENT:  Head: Normocephalic and atraumatic.  Right Ear: External ear normal.  Left Ear: External ear normal.  Nose: Nose normal.  Mouth/Throat: Oropharynx is clear and moist. No oropharyngeal exudate.  Eyes: Conjunctivae and EOM are normal. Pupils are equal, round, and reactive to light. Right eye exhibits no discharge. Left eye exhibits no discharge. No scleral icterus.  Neck: Normal range of motion. Neck supple. No tracheal deviation  present. No thyromegaly present.  Cardiovascular: Normal rate, regular rhythm and normal heart sounds.  Exam reveals no gallop and no friction rub.   No murmur heard. Pulmonary/Chest: Effort normal and breath sounds normal. No respiratory distress. He has no wheezes. He has no rales. He exhibits no tenderness.  Abdominal: Soft. Bowel sounds are normal. He exhibits no distension and no mass. There is no tenderness. There is no rebound and no guarding.  Musculoskeletal: Normal range of motion. He exhibits no edema.  Lymphadenopathy:    He has no cervical adenopathy.  Neurological: He is alert and oriented to person, place, and time. No cranial nerve deficit. Coordination normal.  Skin: Skin is warm and dry. No rash noted. He is not diaphoretic. No erythema. No pallor.  Psychiatric: He has a normal mood and affect. His behavior is normal. Judgment and thought content normal.          Assessment & Plan:  Patient was given  a handout regarding current recommendations for health maintenance and preventative care on the AVS.  Problem List Items Addressed This Visit      Unprioritized   Diabetes type 2, controlled    Recheck A1c with labs today.      Relevant Orders   Comprehensive metabolic panel   CBC with Differential/Platelet   Hemoglobin A1c   Lipid panel   Hypertension    BP Readings from Last 3 Encounters:  08/29/15 153/78  06/05/15 120/68  03/27/15 118/68   BP slightly elevated today, however has been well controlled in past. Suspect recent use of Sudafed playing a role. Will have him monitor at home and recheck at follow up.      Medicare annual wellness visit, subsequent - Primary    General medical exam normal today. Labs as ordered. Discussed limitations of PSA testing. Colonoscopy UTD. Flu vaccine today. Follow up 6 months and prn.      Relevant Orders   PSA, Medicare       Return in about 6 months (around 02/26/2016) for Recheck of Diabetes.

## 2015-08-29 NOTE — Assessment & Plan Note (Signed)
Recheck A1c with labs today.

## 2015-08-29 NOTE — Progress Notes (Signed)
Pre visit review using our clinic review tool, if applicable. No additional management support is needed unless otherwise documented below in the visit note. 

## 2015-08-29 NOTE — Assessment & Plan Note (Signed)
General medical exam normal today. Labs as ordered. Discussed limitations of PSA testing. Colonoscopy UTD. Flu vaccine today. Follow up 6 months and prn.

## 2015-08-29 NOTE — Assessment & Plan Note (Signed)
BP Readings from Last 3 Encounters:  08/29/15 153/78  06/05/15 120/68  03/27/15 118/68   BP slightly elevated today, however has been well controlled in past. Suspect recent use of Sudafed playing a role. Will have him monitor at home and recheck at follow up.

## 2015-09-07 DIAGNOSIS — M546 Pain in thoracic spine: Secondary | ICD-10-CM | POA: Diagnosis not present

## 2015-09-07 DIAGNOSIS — M5136 Other intervertebral disc degeneration, lumbar region: Secondary | ICD-10-CM | POA: Diagnosis not present

## 2015-09-07 DIAGNOSIS — M9902 Segmental and somatic dysfunction of thoracic region: Secondary | ICD-10-CM | POA: Diagnosis not present

## 2015-09-07 DIAGNOSIS — M9903 Segmental and somatic dysfunction of lumbar region: Secondary | ICD-10-CM | POA: Diagnosis not present

## 2015-09-07 DIAGNOSIS — M9905 Segmental and somatic dysfunction of pelvic region: Secondary | ICD-10-CM | POA: Diagnosis not present

## 2015-09-07 DIAGNOSIS — M955 Acquired deformity of pelvis: Secondary | ICD-10-CM | POA: Diagnosis not present

## 2015-09-28 DIAGNOSIS — D229 Melanocytic nevi, unspecified: Secondary | ICD-10-CM | POA: Diagnosis not present

## 2015-09-28 DIAGNOSIS — Z1283 Encounter for screening for malignant neoplasm of skin: Secondary | ICD-10-CM | POA: Diagnosis not present

## 2015-09-28 DIAGNOSIS — L578 Other skin changes due to chronic exposure to nonionizing radiation: Secondary | ICD-10-CM | POA: Diagnosis not present

## 2015-09-28 DIAGNOSIS — L820 Inflamed seborrheic keratosis: Secondary | ICD-10-CM | POA: Diagnosis not present

## 2015-09-28 DIAGNOSIS — I8310 Varicose veins of unspecified lower extremity with inflammation: Secondary | ICD-10-CM | POA: Diagnosis not present

## 2015-09-28 DIAGNOSIS — L812 Freckles: Secondary | ICD-10-CM | POA: Diagnosis not present

## 2015-09-28 DIAGNOSIS — D692 Other nonthrombocytopenic purpura: Secondary | ICD-10-CM | POA: Diagnosis not present

## 2015-09-28 DIAGNOSIS — L821 Other seborrheic keratosis: Secondary | ICD-10-CM | POA: Diagnosis not present

## 2015-09-28 DIAGNOSIS — L570 Actinic keratosis: Secondary | ICD-10-CM | POA: Diagnosis not present

## 2015-10-05 DIAGNOSIS — M955 Acquired deformity of pelvis: Secondary | ICD-10-CM | POA: Diagnosis not present

## 2015-10-05 DIAGNOSIS — M9905 Segmental and somatic dysfunction of pelvic region: Secondary | ICD-10-CM | POA: Diagnosis not present

## 2015-10-05 DIAGNOSIS — M546 Pain in thoracic spine: Secondary | ICD-10-CM | POA: Diagnosis not present

## 2015-10-05 DIAGNOSIS — M9903 Segmental and somatic dysfunction of lumbar region: Secondary | ICD-10-CM | POA: Diagnosis not present

## 2015-10-05 DIAGNOSIS — M5136 Other intervertebral disc degeneration, lumbar region: Secondary | ICD-10-CM | POA: Diagnosis not present

## 2015-10-05 DIAGNOSIS — M9902 Segmental and somatic dysfunction of thoracic region: Secondary | ICD-10-CM | POA: Diagnosis not present

## 2015-10-27 DIAGNOSIS — Z961 Presence of intraocular lens: Secondary | ICD-10-CM | POA: Diagnosis not present

## 2015-10-27 LAB — HM DIABETES EYE EXAM

## 2015-10-27 LAB — HM DIABETES FOOT EXAM: HM Diabetic Foot Exam: NORMAL

## 2015-10-30 ENCOUNTER — Other Ambulatory Visit: Payer: Self-pay | Admitting: Internal Medicine

## 2015-11-16 DIAGNOSIS — M9902 Segmental and somatic dysfunction of thoracic region: Secondary | ICD-10-CM | POA: Diagnosis not present

## 2015-11-16 DIAGNOSIS — M546 Pain in thoracic spine: Secondary | ICD-10-CM | POA: Diagnosis not present

## 2015-11-16 DIAGNOSIS — M955 Acquired deformity of pelvis: Secondary | ICD-10-CM | POA: Diagnosis not present

## 2015-11-16 DIAGNOSIS — M9903 Segmental and somatic dysfunction of lumbar region: Secondary | ICD-10-CM | POA: Diagnosis not present

## 2015-11-16 DIAGNOSIS — M9905 Segmental and somatic dysfunction of pelvic region: Secondary | ICD-10-CM | POA: Diagnosis not present

## 2015-11-16 DIAGNOSIS — M5136 Other intervertebral disc degeneration, lumbar region: Secondary | ICD-10-CM | POA: Diagnosis not present

## 2015-11-21 LAB — HM DIABETES EYE EXAM

## 2016-01-09 ENCOUNTER — Encounter: Payer: Self-pay | Admitting: *Deleted

## 2016-01-11 DIAGNOSIS — M546 Pain in thoracic spine: Secondary | ICD-10-CM | POA: Diagnosis not present

## 2016-01-11 DIAGNOSIS — M9905 Segmental and somatic dysfunction of pelvic region: Secondary | ICD-10-CM | POA: Diagnosis not present

## 2016-01-11 DIAGNOSIS — M5136 Other intervertebral disc degeneration, lumbar region: Secondary | ICD-10-CM | POA: Diagnosis not present

## 2016-01-11 DIAGNOSIS — M9902 Segmental and somatic dysfunction of thoracic region: Secondary | ICD-10-CM | POA: Diagnosis not present

## 2016-01-11 DIAGNOSIS — M9903 Segmental and somatic dysfunction of lumbar region: Secondary | ICD-10-CM | POA: Diagnosis not present

## 2016-01-11 DIAGNOSIS — M955 Acquired deformity of pelvis: Secondary | ICD-10-CM | POA: Diagnosis not present

## 2016-01-25 ENCOUNTER — Encounter: Payer: Self-pay | Admitting: Internal Medicine

## 2016-02-26 ENCOUNTER — Ambulatory Visit: Payer: Medicare Other | Admitting: Internal Medicine

## 2016-02-28 ENCOUNTER — Telehealth: Payer: Self-pay | Admitting: Internal Medicine

## 2016-03-04 ENCOUNTER — Encounter: Payer: Self-pay | Admitting: Internal Medicine

## 2016-03-04 ENCOUNTER — Ambulatory Visit (INDEPENDENT_AMBULATORY_CARE_PROVIDER_SITE_OTHER): Payer: Medicare Other | Admitting: Internal Medicine

## 2016-03-04 VITALS — BP 128/74 | HR 98 | Temp 98.1°F | Ht 70.0 in | Wt 213.2 lb

## 2016-03-04 DIAGNOSIS — I10 Essential (primary) hypertension: Secondary | ICD-10-CM | POA: Diagnosis not present

## 2016-03-04 DIAGNOSIS — E119 Type 2 diabetes mellitus without complications: Secondary | ICD-10-CM

## 2016-03-04 DIAGNOSIS — N4281 Prostatodynia syndrome: Secondary | ICD-10-CM

## 2016-03-04 DIAGNOSIS — E785 Hyperlipidemia, unspecified: Secondary | ICD-10-CM | POA: Diagnosis not present

## 2016-03-04 LAB — POCT URINALYSIS DIPSTICK
BILIRUBIN UA: NEGATIVE
Blood, UA: NEGATIVE
Glucose, UA: NEGATIVE
Ketones, UA: 40
LEUKOCYTES UA: NEGATIVE
NITRITE UA: NEGATIVE
PH UA: 5
PROTEIN UA: 30
Spec Grav, UA: >=1.03
Urobilinogen, UA: 0.2

## 2016-03-04 MED ORDER — CIPROFLOXACIN HCL 500 MG PO TABS: 500.0000 mg | ORAL_TABLET | Freq: Two times a day (BID) | ORAL | Status: DC

## 2016-03-04 NOTE — Assessment & Plan Note (Signed)
Repeat lipids with labs today. Continue Simvastatin.

## 2016-03-04 NOTE — Patient Instructions (Signed)
Start Cipro twice daily to treat prostate infection.  Follow up in 2 weeks or sooner if symptoms are not improving.  Labs today.

## 2016-03-04 NOTE — Progress Notes (Signed)
Pre visit review using our clinic review tool, if applicable. No additional management support is needed unless otherwise documented below in the visit note. 

## 2016-03-04 NOTE — Assessment & Plan Note (Signed)
BP Readings from Last 3 Encounters:  03/04/16 128/74  08/29/15 153/78  06/05/15 120/68   BP well controlled. Renal function with labs.

## 2016-03-04 NOTE — Assessment & Plan Note (Signed)
Will check A1c with labs. Continue Onglyza. 

## 2016-03-04 NOTE — Assessment & Plan Note (Signed)
Recent symptoms of urinary frequency, urgency, similar to previous episode of prostatitis. However, exam consistent with early epididymitis and prostatitis. Will start Cipro. UA sent today. Follow up in 1-2 weeks if symptoms are not improving.

## 2016-03-04 NOTE — Progress Notes (Signed)
Subjective:    Patient ID: Richard Hardy, male    DOB: April 02, 1946, 71 y.o.   MRN: [id number]  HPI  70YO male presents for follow up.  Recurrent symptoms of increased urinary frequency. In past, treated for prostatitis with resolution. Also notes mild tenderness over bilateral testicles, most prominent posteriorly. No swelling. No severe pain. No decreased urinary stream.  DM - BG near 120-150 at max. Compliant with medication.  Wt Readings from Last 3 Encounters:  03/04/16 213 lb 4 oz (96.73 kg)  08/29/15 215 lb 4 oz (97.637 kg)  06/05/15 216 lb 8 oz (98.204 kg)   BP Readings from Last 3 Encounters:  03/04/16 128/74  08/29/15 153/78  06/05/15 120/68    Past Medical History  Diagnosis Date  . Obesity   . Glucose intolerance (impaired glucose tolerance)   . Hyperlipidemia   . Sleep apnea     On BiPAP, Dr. Richardson Landry  . Thrombocythemia ([place])     Dr. Grayland Ormond  . Diabetes mellitus without complication ([place]) [id number]   Family History  Problem Relation Age of Onset  . Cancer Mother     brain  . Heart attack Father 63  . Heart disease Father   . Heart disease Brother     s/p CABG  . Crohn's disease Brother   . Hyperlipidemia Son   . Hypertension Son   . Sleep apnea Son   . Thyroid disease Son   . Heart disease Paternal Grandfather    Past Surgical History  Procedure Laterality Date  . Cataract extraction, bilateral    . Lithotripsy      Dr. Bernardo Heater  . Nasal septum surgery     Social History   Social History  . Marital Status: Married    Spouse Name: N/A  . Number of Children: N/A  . Years of Education: N/A   Social History Main Topics  . Smoking status: Former Smoker -- 0.50 packs/day for 4 years    Quit date: 12/10/1963  . Smokeless tobacco: None  . Alcohol Use: Yes     Comment: Occasional  . Drug Use: No  . Sexual Activity: Not Asked   Other Topics Concern  . None   Social History Narrative   Lives in [place] with wife. 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  Eyes: Negative for visual disturbance.  Respiratory: Negative for cough and shortness of breath.   Cardiovascular: Negative for chest pain, palpitations and leg swelling.  Gastrointestinal: Negative for nausea, vomiting, abdominal pain, diarrhea, constipation and abdominal distention.  Genitourinary: Positive for urgency, frequency and testicular pain. Negative for dysuria, hematuria, flank pain, decreased urine volume, penile swelling, scrotal swelling, difficulty urinating and genital sores.  Musculoskeletal: Negative for arthralgias and gait problem.  Skin: Negative for color change and rash.  Hematological: Negative for adenopathy.  Psychiatric/Behavioral: Negative for sleep disturbance and dysphoric mood. The patient is not nervous/anxious.        Objective:    BP 128/74 mmHg  Pulse 98  Temp(Src) 98.1 F (36.7 C) (Oral)  Ht 5\' 10"  (1.778 m)  Wt 213 lb 4 oz (96.73 kg)  BMI 30.60 kg/m2  SpO2 93% Physical Exam  Constitutional: He is oriented to person, place, and time. He appears well-developed and well-nourished. No distress.  HENT:  Head: Normocephalic and atraumatic.  Right Ear: External ear normal.  Left Ear: External ear normal.  Nose: Nose normal.  Mouth/Throat: Oropharynx is clear and moist. No oropharyngeal exudate.  Eyes: Conjunctivae and EOM are normal. Pupils are equal, round, and reactive to light. Right eye exhibits no discharge. Left eye exhibits no discharge. No scleral icterus.  Neck: Normal range of motion. Neck supple. No tracheal deviation present. No thyromegaly present.  Cardiovascular: Normal rate, regular rhythm and normal heart sounds.  Exam reveals no gallop and no friction rub.     No murmur heard. Pulmonary/Chest: Effort normal and breath sounds normal. No accessory muscle usage. No tachypnea. No respiratory distress. He has no decreased breath sounds. He has no wheezes. He has no rhonchi. He has no rales. He exhibits no tenderness.  Genitourinary: Rectum normal and penis normal. Rectal exam shows no external hemorrhoid, no fissure, no mass, no tenderness and anal tone normal. Guaiac negative stool. Prostate is tender (mild). Prostate is not enlarged. Right testis shows tenderness (mild posterior). Left testis shows tenderness (posterior).  Musculoskeletal: Normal range of motion. He exhibits no edema.  Lymphadenopathy:    He has no cervical adenopathy.  Neurological: He is alert and oriented to person, place, and time. No cranial nerve deficit. Coordination normal.  Skin: Skin is warm and dry. No rash noted. He is not diaphoretic. No erythema. No pallor.  Psychiatric: He has a normal mood and affect. His behavior is normal. Judgment and thought content normal.          Assessment & Plan:   Problem List Items Addressed This Visit      Unprioritized   Diabetes type 2, controlled ([place]) - Primary    Will check A1c with labs. Continue Onglyza.       Relevant Orders   Comprehensive metabolic panel   Hemoglobin A1c   Hyperlipemia    Repeat lipids with labs today. Continue Simvastatin.      Relevant Orders   Lipid panel   Hypertension    BP Readings from Last 3 Encounters:  03/04/16 128/74  08/29/15 153/78  06/05/15 120/68   BP well controlled. Renal function with labs.      Prostatalgia    Recent symptoms of urinary frequency, urgency, similar to previous episode of prostatitis. However, exam consistent with early epididymitis and prostatitis. Will start Cipro. UA sent today. Follow up in 1-2 weeks if symptoms are not improving.      Relevant Orders   POCT urinalysis dipstick       Return in about 2 weeks (around 03/18/2016) for  Recheck.  Ronette Deter, MD Internal [facility]

## 2016-03-05 LAB — COMPREHENSIVE METABOLIC PANEL
ALK PHOS: 57 U/L (ref 39–117)
ALT: 24 U/L (ref 0–53)
AST: 24 U/L (ref 0–37)
Albumin: 4.5 g/dL (ref 3.5–5.2)
BUN: 15 mg/dL (ref 6–23)
CHLORIDE: 107 meq/L (ref 96–112)
CO2: 29 mEq/L (ref 19–32)
Calcium: 9.6 mg/dL (ref 8.4–10.5)
Creatinine, Ser: 1.06 mg/dL (ref 0.40–1.50)
GFR: 72.97 mL/min (ref >60.00)
Glucose, Bld: 117 mg/dL — ABNORMAL HIGH (ref 70–99)
Potassium: 4.3 mEq/L (ref 3.5–5.1)
SODIUM: 144 meq/L (ref 135–145)
Total Bilirubin: 1.1 mg/dL (ref 0.2–1.2)
Total Protein: 7.1 g/dL (ref 6.0–8.3)

## 2016-03-05 LAB — LIPID PANEL
Cholesterol: 112 mg/dL (ref 0–200)
HDL: 39.9 mg/dL (ref >39.00)
LDL Cholesterol: 61 mg/dL (ref 0–99)
NONHDL: 71.77
TRIGLYCERIDES: 56 mg/dL (ref 0.0–149.0)
Total CHOL/HDL Ratio: 3
VLDL: 11.2 mg/dL (ref 0.0–40.0)

## 2016-03-05 LAB — HEMOGLOBIN A1C: HEMOGLOBIN A1C: 6.9 % — AB (ref 4.6–6.5)

## 2016-03-07 ENCOUNTER — Other Ambulatory Visit: Payer: Self-pay

## 2016-03-07 MED ORDER — SIMVASTATIN 40 MG PO TABS: ORAL_TABLET | ORAL | Status: DC

## 2016-03-08 ENCOUNTER — Telehealth: Payer: Self-pay | Admitting: *Deleted

## 2016-03-08 MED ORDER — SIMVASTATIN 40 MG PO TABS: ORAL_TABLET | ORAL | Status: DC

## 2016-03-08 MED ORDER — SAXAGLIPTIN HCL 5 MG PO TABS: ORAL_TABLET | ORAL | Status: DC

## 2016-03-08 NOTE — Telephone Encounter (Signed)
I have refilled his requests thanks

## 2016-03-08 NOTE — Telephone Encounter (Signed)
Patient wanted to [facility] the office pharmacy will be [facility]  Phone [phone] Patient will need a medication refill for simvastatin and onglyza.  Pt Contact [phone]

## 2016-03-14 DIAGNOSIS — M9902 Segmental and somatic dysfunction of thoracic region: Secondary | ICD-10-CM | POA: Diagnosis not present

## 2016-03-14 DIAGNOSIS — M546 Pain in thoracic spine: Secondary | ICD-10-CM | POA: Diagnosis not present

## 2016-03-14 DIAGNOSIS — M955 Acquired deformity of pelvis: Secondary | ICD-10-CM | POA: Diagnosis not present

## 2016-03-14 DIAGNOSIS — M9905 Segmental and somatic dysfunction of pelvic region: Secondary | ICD-10-CM | POA: Diagnosis not present

## 2016-03-14 DIAGNOSIS — M9903 Segmental and somatic dysfunction of lumbar region: Secondary | ICD-10-CM | POA: Diagnosis not present

## 2016-03-14 DIAGNOSIS — M5136 Other intervertebral disc degeneration, lumbar region: Secondary | ICD-10-CM | POA: Diagnosis not present

## 2016-05-20 ENCOUNTER — Encounter: Payer: Self-pay | Admitting: Internal Medicine

## 2016-05-20 ENCOUNTER — Ambulatory Visit (INDEPENDENT_AMBULATORY_CARE_PROVIDER_SITE_OTHER): Payer: Medicare Other | Admitting: Internal Medicine

## 2016-05-20 VITALS — BP 140/80 | HR 45 | Ht 70.0 in | Wt 212.4 lb

## 2016-05-20 DIAGNOSIS — E785 Hyperlipidemia, unspecified: Secondary | ICD-10-CM

## 2016-05-20 DIAGNOSIS — I10 Essential (primary) hypertension: Secondary | ICD-10-CM | POA: Diagnosis not present

## 2016-05-20 DIAGNOSIS — E119 Type 2 diabetes mellitus without complications: Secondary | ICD-10-CM | POA: Diagnosis not present

## 2016-05-20 LAB — COMPREHENSIVE METABOLIC PANEL
ALBUMIN: 4.1 g/dL (ref 3.5–5.2)
ALT: 22 U/L (ref 0–53)
AST: 19 U/L (ref 0–37)
Alkaline Phosphatase: 57 U/L (ref 39–117)
BUN: 16 mg/dL (ref 6–23)
CALCIUM: 9.1 mg/dL (ref 8.4–10.5)
CHLORIDE: 107 meq/L (ref 96–112)
CO2: 29 mEq/L (ref 19–32)
CREATININE: 0.96 mg/dL (ref 0.40–1.50)
GFR: 81.76 mL/min (ref >60.00)
Glucose, Bld: 150 mg/dL — ABNORMAL HIGH (ref 70–99)
POTASSIUM: 5.3 meq/L — AB (ref 3.5–5.1)
Sodium: 141 mEq/L (ref 135–145)
Total Bilirubin: 0.7 mg/dL (ref 0.2–1.2)
Total Protein: 6.4 g/dL (ref 6.0–8.3)

## 2016-05-20 LAB — MICROALBUMIN / CREATININE URINE RATIO
Creatinine,U: 182.8 mg/dL
MICROALB UR: 1.5 mg/dL (ref 0.0–1.9)
Microalb Creat Ratio: 0.8 mg/g (ref 0.0–30.0)

## 2016-05-20 LAB — HEMOGLOBIN A1C: Hgb A1c MFr Bld: 6.7 % — ABNORMAL HIGH (ref 4.6–6.5)

## 2016-05-20 LAB — LIPID PANEL
CHOLESTEROL: 119 mg/dL (ref 0–200)
HDL: 38.7 mg/dL — ABNORMAL LOW (ref >39.00)
LDL CALC: 70 mg/dL (ref 0–99)
NonHDL: 80.75
Total CHOL/HDL Ratio: 3
Triglycerides: 53 mg/dL (ref 0.0–149.0)
VLDL: 10.6 mg/dL (ref 0.0–40.0)

## 2016-05-20 NOTE — Assessment & Plan Note (Signed)
BG well controlled by report. A1c with labs. Follow up in 6 months and prn. Continue current medication.

## 2016-05-20 NOTE — Assessment & Plan Note (Signed)
Lipids and LFTs with labs today. Continue Simvastatin. 

## 2016-05-20 NOTE — Assessment & Plan Note (Signed)
BP Readings from Last 3 Encounters:  05/20/16 140/80  03/04/16 128/74  08/29/15 153/78   BP well controlled generally. Renal function with labs. Continue current medication.

## 2016-05-20 NOTE — Progress Notes (Signed)
Subjective:    Patient ID: Richard Hardy, male    DOB: [birth year], 70 y.o.   MRN: [id number]  HPI  70YO male presents for follow up.  DM - BG generally well controlled. 133 this morning. Compliant with medication.  HTN - No CP, HA, palpitations. Does not generally check BP. Compliant with medication.  No concerns today.   Wt Readings from Last 3 Encounters:  05/20/16 212 lb 6.4 oz (96.344 kg)  03/04/16 213 lb 4 oz (96.73 kg)  08/29/15 215 lb 4 oz (97.637 kg)   BP Readings from Last 3 Encounters:  05/20/16 140/80  03/04/16 128/74  08/29/15 153/78    Past Medical History  Diagnosis Date  . Obesity   . Glucose intolerance (impaired glucose tolerance)   . Hyperlipidemia   . Sleep apnea     On BiPAP, Dr. Richardson Landry  . Thrombocythemia ([place])     Dr. Grayland Ormond  . Diabetes mellitus without complication ([place]) 123XX123   Family History  Problem Relation Age of Onset  . Cancer Mother     brain  . Heart attack Father 50  . Heart disease Father   . Heart disease Brother     s/p CABG  . Crohn's disease Brother   . Hyperlipidemia Son   . Hypertension Son   . Sleep apnea Son   . Thyroid disease Son   . Heart disease Paternal Grandfather    Past Surgical History  Procedure Laterality Date  . Cataract extraction, bilateral    . Lithotripsy      Dr. Bernardo Heater  . Nasal septum surgery     Social History   Social History  . Marital Status: Married    Spouse Name: N/A  . Number of Children: N/A  . Years of Education: N/A   Social History Main Topics  . Smoking status: Former Smoker -- 0.50 packs/day for 4 years    Quit date: 12/10/1963  . Smokeless tobacco: None  . Alcohol Use: Yes     Comment: Occasional  . Drug Use: No  . Sexual Activity: Not Asked   Other Topics Concern  . None   Social History Narrative   Lives in [place] with wife. 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  Eyes: Negative for visual disturbance.  Respiratory: Negative for cough and shortness of breath.   Cardiovascular: Negative for chest pain, palpitations and leg swelling.  Gastrointestinal: Negative for nausea, vomiting, abdominal pain, diarrhea, constipation and abdominal distention.  Genitourinary: Negative for dysuria, urgency and difficulty urinating.  Musculoskeletal: Negative for arthralgias and gait problem.  Skin: Negative for color change and rash.  Hematological: Negative for adenopathy.  Psychiatric/Behavioral: Negative for sleep disturbance and dysphoric mood. The patient is not nervous/anxious.        Objective:    BP 140/80 mmHg  Pulse 45  Ht 5\' 10"  (1.778 m)  Wt 212 lb 6.4 oz (96.344 kg)  BMI 30.48 kg/m2  SpO2 95% Physical Exam  Constitutional: He is oriented to person, place, and time. He appears well-developed and well-nourished. No distress.  HENT:  Head: Normocephalic and atraumatic.  Right Ear: External ear normal.  Left Ear: External ear normal.  Nose: Nose normal.  Mouth/Throat: Oropharynx is clear and moist. No oropharyngeal exudate.  Eyes: Conjunctivae and EOM are normal. Pupils are equal, round, and reactive to light. Right eye exhibits no discharge. Left eye exhibits no discharge. No scleral icterus.  Neck: Normal range of motion. Neck supple. No tracheal deviation present. No thyromegaly present.  Cardiovascular: Normal rate, regular rhythm and normal heart sounds.  Exam reveals no gallop and no friction rub.   No murmur heard. Pulmonary/Chest: Effort normal and breath sounds normal. No accessory muscle usage. No tachypnea. No respiratory distress. He has no decreased breath sounds. He has no wheezes. He has no rhonchi. He has no rales. He exhibits no tenderness.    Musculoskeletal: Normal range of motion. He exhibits no edema.  Lymphadenopathy:    He has no cervical adenopathy.  Neurological: He is alert and oriented to person, place, and time. No cranial nerve deficit. Coordination normal.  Skin: Skin is warm and dry. No rash noted. He is not diaphoretic. No erythema. No pallor.  Psychiatric: He has a normal mood and affect. His behavior is normal. Judgment and thought content normal.          Assessment & Plan:   Problem List Items Addressed This Visit      Unprioritized   Diabetes type 2, controlled ([place]) - Primary    BG well controlled by report. A1c with labs. Follow up in 6 months and prn. Continue current medication.      Relevant Orders   Comprehensive metabolic panel   Hemoglobin A1c   Lipid panel   Microalbumin / creatinine urine ratio   Hyperlipemia    Lipids and LFTs with labs today. Continue Simvastatin.      Hypertension    BP Readings from Last 3 Encounters:  05/20/16 140/80  03/04/16 128/74  08/29/15 153/78   BP well controlled generally. Renal function with labs. Continue current medication.          Return in about 6 months (around 11/19/2016) for Recheck of Diabetes.  Ronette Deter, MD Internal Medicine [facility]

## 2016-05-20 NOTE — Patient Instructions (Signed)
Labs today.  Follow up in 6 months. 

## 2016-05-21 ENCOUNTER — Telehealth: Payer: Self-pay

## 2016-05-21 DIAGNOSIS — E875 Hyperkalemia: Secondary | ICD-10-CM

## 2016-05-21 NOTE — Telephone Encounter (Signed)
Patient's wife was informed of results.  She understood results and no questions, comments, or concerns at this time. Orders for Potassium have been ordered per Dr. Gilford Rile.

## 2016-05-23 DIAGNOSIS — M546 Pain in thoracic spine: Secondary | ICD-10-CM | POA: Diagnosis not present

## 2016-05-23 DIAGNOSIS — M5136 Other intervertebral disc degeneration, lumbar region: Secondary | ICD-10-CM | POA: Diagnosis not present

## 2016-05-23 DIAGNOSIS — M9903 Segmental and somatic dysfunction of lumbar region: Secondary | ICD-10-CM | POA: Diagnosis not present

## 2016-05-23 DIAGNOSIS — M955 Acquired deformity of pelvis: Secondary | ICD-10-CM | POA: Diagnosis not present

## 2016-05-23 DIAGNOSIS — M9905 Segmental and somatic dysfunction of pelvic region: Secondary | ICD-10-CM | POA: Diagnosis not present

## 2016-05-23 DIAGNOSIS — M9902 Segmental and somatic dysfunction of thoracic region: Secondary | ICD-10-CM | POA: Diagnosis not present

## 2016-05-24 ENCOUNTER — Other Ambulatory Visit (INDEPENDENT_AMBULATORY_CARE_PROVIDER_SITE_OTHER): Payer: Medicare Other

## 2016-05-24 DIAGNOSIS — E875 Hyperkalemia: Secondary | ICD-10-CM | POA: Diagnosis not present

## 2016-05-24 LAB — POTASSIUM: Potassium: 4.5 mEq/L (ref 3.5–5.1)

## 2016-06-05 ENCOUNTER — Encounter: Payer: Self-pay | Admitting: Cardiovascular Disease

## 2016-06-05 ENCOUNTER — Ambulatory Visit (INDEPENDENT_AMBULATORY_CARE_PROVIDER_SITE_OTHER): Payer: Medicare Other | Admitting: Cardiovascular Disease

## 2016-06-05 VITALS — BP 130/82 | HR 56 | Ht 71.0 in | Wt 211.5 lb

## 2016-06-05 DIAGNOSIS — E119 Type 2 diabetes mellitus without complications: Secondary | ICD-10-CM | POA: Diagnosis not present

## 2016-06-05 DIAGNOSIS — R001 Bradycardia, unspecified: Secondary | ICD-10-CM

## 2016-06-05 DIAGNOSIS — E785 Hyperlipidemia, unspecified: Secondary | ICD-10-CM

## 2016-06-05 DIAGNOSIS — I493 Ventricular premature depolarization: Secondary | ICD-10-CM

## 2016-06-05 DIAGNOSIS — I10 Essential (primary) hypertension: Secondary | ICD-10-CM | POA: Diagnosis not present

## 2016-06-05 MED ORDER — AMOXICILLIN-POT CLAVULANATE 875-125 MG PO TABS: 1.0000 | ORAL_TABLET | Freq: Two times a day (BID) | ORAL | Status: DC

## 2016-06-05 NOTE — Progress Notes (Signed)
Patient ID: Richard Hardy, male   DOB: November 15, 1946, 70 y.o.   MRN: [id number] Cardiology Office Note  Date:  06/05/2016   ID:  Richard Hardy, Richard Hardy [birth year], MRN [id number]  PCP:  Rica Mast, MD   Chief Complaint  Patient presents with  . other    12 month follow up. Meds reviewed by the patient verbally. "doing well."    HPI:  Richard Hardy is a 70 year old male with a history of hyperlipidemia, glucose intolerance, obstructive sleep apnea on CPAP, Borderline diabetes, and a strong family history of coronary artery disease who returns for routine followup of his hyperlipidemia. 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    overall he states that he has been doing well.  Continues to work for [employer],  He reports that he walks a significant distance daily  No symptoms of chest pain or shortness of breath with exertion.  Hemoglobin A1c 6.7, total cholesterol less than 130 Doing well on onglyza. Has not been restricting his diet  He reports having several weeks of sinus congestion, postnasal drip, cough worse at nighttime Bringing 'something up'  EKG on today's visit shows sinus rhythm with rate 56 bpm, no significant ST or T-wave changes Heart rate typically runs in the 50s  Other past medical history seen by Dr. Grayland Ormond of hematology oncology for prior drop in his platelets. Aspirin was held through this.    Myoview in 2009 which showed an EF of 58% with no evidence of ischemia or infarct.  Previous ABIs which were normal at 1.3 on the right and 1.2 on the left.  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  PSH:    Past Surgical History  Procedure Laterality Date  . Cataract extraction, bilateral    . Lithotripsy      Dr. Bernardo Heater  . Nasal septum surgery      Current  Outpatient Prescriptions  Medication Sig Dispense Refill  . Coenzyme Q10 (COQ10) 200 MG CAPS Take 1 tablet by mouth daily.      . Fish Oil OIL Take by mouth daily.      . fluticasone (FLONASE) 50 MCG/ACT nasal spray Place into the nose at bedtime.      . Glucosamine-Chondroit-Vit C-Mn (GLUCOSAMINE-CHONDROITIN) CAPS Take 2 capsules by mouth daily.      Marland Kitchen glucose blood (BAYER CONTOUR TEST) test strip Check blood sugar 1-2 times daily dx: 250.00 100 each 12  . losartan (COZAAR) 25 MG tablet Take 1 tablet (25 mg total) by mouth daily. 90 tablet 3  . Misc Natural Products (OSTEO BI-FLEX TRIPLE STRENGTH PO) Take by mouth daily.    . Nutritional Supplements (JUICE PLUS FIBRE) LIQD Take 2 tablets by mouth 2 (two) times daily.      . saxagliptin HCl (ONGLYZA) 5 MG TABS tablet TAKE 1 BY MOUTH DAILY 90 tablet 0  . simvastatin (ZOCOR) 40 MG tablet TAKE 1 BY MOUTH DAILY AT 6PM 90 tablet 1  . vitamin C (ASCORBIC ACID) 500 MG tablet Take 500 mg by mouth daily.    . vitamin E (VITAMIN E) 400 UNIT capsule Take 400 Units by mouth daily.    Marland Kitchen amoxicillin-clavulanate (AUGMENTIN) 875-125 MG tablet Take 1 tablet by mouth 2 (two) times daily. 20 tablet 0   No current facility-administered medications for this visit.     Allergies:   Lisinopril; Metformin and related;  and Sulfonamide derivatives   Social History:  The patient  reports that he quit smoking about 52 years ago. He does not have any smokeless tobacco history on file. He reports that he drinks alcohol. He reports that he does not use illicit drugs.   Family History:   family history includes Cancer in his mother; Crohn's disease in his brother; Heart attack (age of onset: 51) in his father; Heart disease in his brother, father, and paternal grandfather; Hyperlipidemia in his son; Hypertension in his son; Sleep apnea in his son; Thyroid disease in his son.    Review of Systems: Review of Systems  Constitutional: Negative.   HENT: Positive for  congestion.        Postnasal drip  Respiratory: Positive for cough.   Cardiovascular: Negative.   Gastrointestinal: Negative.   Musculoskeletal: Negative.   Neurological: Negative.   Psychiatric/Behavioral: Negative.   All other systems reviewed and are negative.    PHYSICAL EXAM: VS:  BP 130/82 mmHg  Pulse 56  Ht 5\' 11"  (1.803 m)  Wt 211 lb 8 oz (95.936 kg)  BMI 29.51 kg/m2 , BMI Body mass index is 29.51 kg/(m^2). GEN: Well nourished, well developed, in no acute distress HEENT: normal Neck: no JVD, carotid bruits, or masses Cardiac: RRR; no murmurs, rubs, or gallops,no edema  Respiratory:  clear to auscultation bilaterally, normal work of breathing GI: soft, nontender, nondistended, + BS MS: no deformity or atrophy Skin: warm and dry, no rash Neuro:  Strength and sensation are intact Psych: euthymic mood, full affect    Recent Labs: 08/29/2015: Hemoglobin 15.6; Platelets 167.0 05/20/2016: ALT 22; BUN 16; Creatinine, Ser 0.96; Sodium 141 05/24/2016: Potassium 4.5    Lipid Panel Lab Results  Component Value Date   CHOL 119 05/20/2016   HDL 38.70* 05/20/2016   LDLCALC 70 05/20/2016   TRIG 53.0 05/20/2016      Wt Readings from Last 3 Encounters:  06/05/16 211 lb 8 oz (95.936 kg)  05/20/16 212 lb 6.4 oz (96.344 kg)  03/04/16 213 lb 4 oz (96.73 kg)       ASSESSMENT AND PLAN:   Essential hypertension - Plan: EKG 12-Lead Blood pressure is well controlled on today's visit. No changes made to the medications.  Hyperlipemia Cholesterol is at goal on the current lipid regimen. No changes to the medications were made.  Controlled type 2 diabetes mellitus without complication, without long-term current use of insulin ([place]) - Plan: EKG 12-Lead We have encouraged continued exercise, careful diet management in an effort to lose weight.  Bradycardia - Plan: EKG 12-Lead Asymptomatic bradycardia   Total encounter time more than 15 minutes  Greater than 50% was spent  in counseling and coordination of care with the patient   Disposition:   F/U  12 months   Orders Placed This Encounter  Procedures  . EKG 12-Lead     Signed, Esmond Plants, M.D., Ph.D. 06/05/2016  [place], [place]

## 2016-06-05 NOTE — Patient Instructions (Signed)
Medication Instructions:   No medication changes  Take the augmentin twice a day for 7 to 10 day   Testing/Procedures:  Research CT coronary calcium score   Follow-Up: It was a pleasure seeing you in the office today. Please call us if you have new issues that need to be addressed before your next appt.  [phone]  Your physician wants you to follow-up in: 12 months.  You will receive a reminder letter in the mail two months in advance. If you don't receive a letter, please call our office to schedule the follow-up appointment.  If you need a refill on your cardiac medications before your next appointment, please call your pharmacy.

## 2016-06-16 ENCOUNTER — Encounter: Payer: Self-pay | Admitting: Internal Medicine

## 2016-06-17 ENCOUNTER — Telehealth: Payer: Self-pay | Admitting: *Deleted

## 2016-06-17 MED ORDER — SAXAGLIPTIN HCL 5 MG PO TABS: ORAL_TABLET | ORAL | Status: DC

## 2016-06-17 NOTE — Telephone Encounter (Signed)
ok 

## 2016-06-17 NOTE — Telephone Encounter (Signed)
Patients wife has requested the Rx for Onglyeza with a refill  be faxed over [facility] [employer] [facility] mail order . Pt is currently out of medication and she requested the Rx be  mailed over night if possible  Fax [phone]

## 2016-06-17 NOTE — Telephone Encounter (Signed)
Pt needs a 90day supply and would like 1 refill on saxagliptin HCl (ONGLYZA) 5 MG TABS tablet.... Sent to [facility] [place]

## 2016-06-17 NOTE — Telephone Encounter (Signed)
Refill has been sent. thanks

## 2016-06-17 NOTE — Telephone Encounter (Signed)
The refill was sent to the pharmacy this morning, I am not sure about over-nighting, she would need to arrange that with them.

## 2016-09-05 DIAGNOSIS — M5136 Other intervertebral disc degeneration, lumbar region: Secondary | ICD-10-CM | POA: Diagnosis not present

## 2016-09-05 DIAGNOSIS — M546 Pain in thoracic spine: Secondary | ICD-10-CM | POA: Diagnosis not present

## 2016-09-05 DIAGNOSIS — M9903 Segmental and somatic dysfunction of lumbar region: Secondary | ICD-10-CM | POA: Diagnosis not present

## 2016-09-05 DIAGNOSIS — M9902 Segmental and somatic dysfunction of thoracic region: Secondary | ICD-10-CM | POA: Diagnosis not present

## 2016-09-05 DIAGNOSIS — M955 Acquired deformity of pelvis: Secondary | ICD-10-CM | POA: Diagnosis not present

## 2016-09-05 DIAGNOSIS — M9905 Segmental and somatic dysfunction of pelvic region: Secondary | ICD-10-CM | POA: Diagnosis not present

## 2016-09-30 DIAGNOSIS — L821 Other seborrheic keratosis: Secondary | ICD-10-CM | POA: Diagnosis not present

## 2016-09-30 DIAGNOSIS — D692 Other nonthrombocytopenic purpura: Secondary | ICD-10-CM | POA: Diagnosis not present

## 2016-09-30 DIAGNOSIS — L820 Inflamed seborrheic keratosis: Secondary | ICD-10-CM | POA: Diagnosis not present

## 2016-09-30 DIAGNOSIS — L570 Actinic keratosis: Secondary | ICD-10-CM | POA: Diagnosis not present

## 2016-09-30 DIAGNOSIS — L812 Freckles: Secondary | ICD-10-CM | POA: Diagnosis not present

## 2016-09-30 DIAGNOSIS — Z1283 Encounter for screening for malignant neoplasm of skin: Secondary | ICD-10-CM | POA: Diagnosis not present

## 2016-09-30 DIAGNOSIS — D229 Melanocytic nevi, unspecified: Secondary | ICD-10-CM | POA: Diagnosis not present

## 2016-09-30 DIAGNOSIS — L578 Other skin changes due to chronic exposure to nonionizing radiation: Secondary | ICD-10-CM | POA: Diagnosis not present

## 2016-09-30 DIAGNOSIS — L918 Other hypertrophic disorders of the skin: Secondary | ICD-10-CM | POA: Diagnosis not present

## 2016-10-03 DIAGNOSIS — M955 Acquired deformity of pelvis: Secondary | ICD-10-CM | POA: Diagnosis not present

## 2016-10-03 DIAGNOSIS — M9905 Segmental and somatic dysfunction of pelvic region: Secondary | ICD-10-CM | POA: Diagnosis not present

## 2016-10-03 DIAGNOSIS — M5136 Other intervertebral disc degeneration, lumbar region: Secondary | ICD-10-CM | POA: Diagnosis not present

## 2016-10-03 DIAGNOSIS — M531 Cervicobrachial syndrome: Secondary | ICD-10-CM | POA: Diagnosis not present

## 2016-10-03 DIAGNOSIS — M9903 Segmental and somatic dysfunction of lumbar region: Secondary | ICD-10-CM | POA: Diagnosis not present

## 2016-10-03 DIAGNOSIS — M9901 Segmental and somatic dysfunction of cervical region: Secondary | ICD-10-CM | POA: Diagnosis not present

## 2016-11-07 DIAGNOSIS — M9905 Segmental and somatic dysfunction of pelvic region: Secondary | ICD-10-CM | POA: Diagnosis not present

## 2016-11-07 DIAGNOSIS — M955 Acquired deformity of pelvis: Secondary | ICD-10-CM | POA: Diagnosis not present

## 2016-11-07 DIAGNOSIS — M5136 Other intervertebral disc degeneration, lumbar region: Secondary | ICD-10-CM | POA: Diagnosis not present

## 2016-11-07 DIAGNOSIS — M531 Cervicobrachial syndrome: Secondary | ICD-10-CM | POA: Diagnosis not present

## 2016-11-07 DIAGNOSIS — M9903 Segmental and somatic dysfunction of lumbar region: Secondary | ICD-10-CM | POA: Diagnosis not present

## 2016-11-07 DIAGNOSIS — M9901 Segmental and somatic dysfunction of cervical region: Secondary | ICD-10-CM | POA: Diagnosis not present

## 2016-11-19 ENCOUNTER — Ambulatory Visit: Payer: Medicare Other | Admitting: Internal Medicine

## 2016-11-20 ENCOUNTER — Encounter: Payer: Self-pay | Admitting: Internal Medicine

## 2016-11-20 ENCOUNTER — Ambulatory Visit (INDEPENDENT_AMBULATORY_CARE_PROVIDER_SITE_OTHER): Payer: Medicare Other | Admitting: Internal Medicine

## 2016-11-20 VITALS — BP 120/74 | HR 95 | Temp 97.4°F | Resp 12 | Ht 71.0 in | Wt 210.0 lb

## 2016-11-20 DIAGNOSIS — E1169 Type 2 diabetes mellitus with other specified complication: Secondary | ICD-10-CM

## 2016-11-20 DIAGNOSIS — E875 Hyperkalemia: Secondary | ICD-10-CM

## 2016-11-20 DIAGNOSIS — Z23 Encounter for immunization: Secondary | ICD-10-CM

## 2016-11-20 DIAGNOSIS — E119 Type 2 diabetes mellitus without complications: Secondary | ICD-10-CM | POA: Diagnosis not present

## 2016-11-20 DIAGNOSIS — E785 Hyperlipidemia, unspecified: Secondary | ICD-10-CM

## 2016-11-20 DIAGNOSIS — I10 Essential (primary) hypertension: Secondary | ICD-10-CM

## 2016-11-20 DIAGNOSIS — E663 Overweight: Secondary | ICD-10-CM

## 2016-11-20 LAB — COMPREHENSIVE METABOLIC PANEL
ALBUMIN: 4.6 g/dL (ref 3.5–5.2)
ALT: 33 U/L (ref 0–53)
AST: 27 U/L (ref 0–37)
Alkaline Phosphatase: 59 U/L (ref 39–117)
BILIRUBIN TOTAL: 1 mg/dL (ref 0.2–1.2)
BUN: 18 mg/dL (ref 6–23)
CALCIUM: 9.7 mg/dL (ref 8.4–10.5)
CHLORIDE: 105 meq/L (ref 96–112)
CO2: 31 meq/L (ref 19–32)
CREATININE: 0.96 mg/dL (ref 0.40–1.50)
GFR: 81.65 mL/min (ref >60.00)
Glucose, Bld: 150 mg/dL — ABNORMAL HIGH (ref 70–99)
Potassium: 5.2 mEq/L — ABNORMAL HIGH (ref 3.5–5.1)
Sodium: 141 mEq/L (ref 135–145)
Total Protein: 7.1 g/dL (ref 6.0–8.3)

## 2016-11-20 LAB — MICROALBUMIN / CREATININE URINE RATIO
Creatinine,U: 254.7 mg/dL
MICROALB UR: 3.7 mg/dL — AB (ref 0.0–1.9)
MICROALB/CREAT RATIO: 1.5 mg/g (ref 0.0–30.0)

## 2016-11-20 LAB — LIPID PANEL
Cholesterol: 120 mg/dL (ref 0–200)
HDL: 45.9 mg/dL (ref >39.00)
LDL Cholesterol: 63 mg/dL (ref 0–99)
NONHDL: 74.15
Total CHOL/HDL Ratio: 3
Triglycerides: 54 mg/dL (ref 0.0–149.0)
VLDL: 10.8 mg/dL (ref 0.0–40.0)

## 2016-11-20 LAB — HEMOGLOBIN A1C: HEMOGLOBIN A1C: 6.7 % — AB (ref 4.6–6.5)

## 2016-11-20 LAB — LDL CHOLESTEROL, DIRECT: LDL DIRECT: 68 mg/dL

## 2016-11-20 MED ORDER — ASPIRIN EC 81 MG PO TBEC: 81.0000 mg | DELAYED_RELEASE_TABLET | Freq: Every day | ORAL | 4 refills | Status: DC

## 2016-11-20 NOTE — Progress Notes (Signed)
Subjective:  Patient ID: Richard Hardy, male    DOB: Apr 13, 1946  Age: 70 y.o. MRN: [id number]  CC: The primary encounter diagnosis was Essential hypertension. Diagnoses of Hyperlipidemia associated with type 2 diabetes mellitus ([place]), Controlled type 2 diabetes mellitus without complication, without long-term current use of insulin ([place]), Encounter for immunization, Overweight (BMI 25.0-29.9), and Hyperkalemia were also pertinent to this visit.  HPI Richard Hardy presents for transition of care follow up on diabetes and hypertesnsion  (referred by his wife, Richard Hardy)  Hypertension:  He is not taking losartan.  Had allergic reaction to lisinopril.   Diabetes mellitus  Type 1:  Managed with onglyza.  He reports normal blood sugars,  Rare occurrence of lows which occur only when he skips lunch.    Lab Results  Component Value Date   MICROALBUR 3.7 (H) 11/20/2016   Lab Results  Component Value Date   HGBA1C 6.7 (H) 11/20/2016   Foot exam normal except for decreased sensation over callouses.   fasting today.  Has an eye exam this month  With Richard Hardy     He is active physically ,  Continues to work full time with Richard Hardy,  Spends most of the day walking .  Outpatient Medications Prior to Visit  Medication Sig Dispense Refill  . Coenzyme Q10 (COQ10) 200 MG CAPS Take 1 tablet by mouth daily.      . Fish Oil OIL Take by mouth daily.      . fluticasone (FLONASE) 50 MCG/ACT nasal spray Place into the nose at bedtime.      . Glucosamine-Chondroit-Vit C-Mn (GLUCOSAMINE-CHONDROITIN) CAPS Take 2 capsules by mouth daily.      Marland Kitchen glucose blood (BAYER CONTOUR TEST) test strip Check blood sugar 1-2 times daily dx: 250.00 100 each 12  . Misc Natural Products (OSTEO BI-FLEX TRIPLE STRENGTH PO) Take by mouth daily.    . Nutritional Supplements (JUICE PLUS FIBRE) LIQD Take 2 tablets by mouth 2 (two) times daily.      . saxagliptin HCl (ONGLYZA) 5 MG TABS tablet TAKE 1 BY MOUTH DAILY 90  tablet 1  . simvastatin (ZOCOR) 40 MG tablet TAKE 1 BY MOUTH DAILY AT 6PM 90 tablet 1  . vitamin C (ASCORBIC ACID) 500 MG tablet Take 500 mg by mouth daily.    . vitamin E (VITAMIN E) 400 UNIT capsule Take 400 Units by mouth daily.    Marland Kitchen amoxicillin-clavulanate (AUGMENTIN) 875-125 MG tablet Take 1 tablet by mouth 2 (two) times daily. 20 tablet 0  . losartan (COZAAR) 25 MG tablet Take 1 tablet (25 mg total) by mouth daily. (Patient not taking: Reported on 11/20/2016) 90 tablet 3   No facility-administered medications prior to visit.     Review of Systems;  Patient denies headache, fevers, malaise, unintentional weight loss, skin rash, eye pain, sinus congestion and sinus pain, sore throat, dysphagia,  hemoptysis , cough, dyspnea, wheezing, chest pain, palpitations, orthopnea, edema, abdominal pain, nausea, melena, diarrhea, constipation, flank pain, dysuria, hematuria, urinary  Frequency, nocturia, numbness, tingling, seizures,  Focal weakness, Loss of consciousness,  Tremor, insomnia, depression, anxiety, and suicidal ideation.      Objective:  BP 120/74   Pulse 95   Temp 97.4 F (36.3 C) (Oral)   Resp 12   Ht 5\' 11"  (1.803 m)   Wt 210 lb (95.3 kg)   SpO2 95%   BMI 29.29 kg/m   BP Readings from Last 3 Encounters:  11/20/16 120/74  06/05/16 130/82  05/20/16 140/80    Wt Readings from Last 3 Encounters:  11/20/16 210 lb (95.3 kg)  06/05/16 211 lb 8 oz (95.9 kg)  05/20/16 212 lb 6.4 oz (96.3 kg)    General appearance: alert, cooperative and appears stated age Ears: normal TM's and external ear canals both ears Throat: lips, mucosa, and tongue normal; teeth and gums normal Neck: no adenopathy, no carotid bruit, supple, symmetrical, trachea midline and thyroid not enlarged, symmetric, no tenderness/mass/nodules Back: symmetric, no curvature. ROM normal. No CVA tenderness. Lungs: clear to auscultation bilaterally Heart: regular rate and rhythm, S1, S2 normal, no murmur, click,  rub or gallop Abdomen: soft, non-tender; bowel sounds normal; no masses,  no organomegaly Pulses: 2+ and symmetric Skin: Skin color, texture, turgor normal. No rashes or lesions Lymph nodes: Cervical, supraclavicular, and axillary nodes normal.  Lab Results  Component Value Date   HGBA1C 6.7 (H) 11/20/2016   HGBA1C 6.7 (H) 05/20/2016   HGBA1C 6.9 (H) 03/04/2016    Lab Results  Component Value Date   CREATININE 0.96 11/20/2016   CREATININE 0.96 05/20/2016   CREATININE 1.06 03/04/2016    Lab Results  Component Value Date   WBC 5.7 08/29/2015   HGB 15.6 08/29/2015   HCT 45.3 08/29/2015   PLT 167.0 08/29/2015   GLUCOSE 150 (H) 11/20/2016   CHOL 120 11/20/2016   TRIG 54.0 11/20/2016   HDL 45.90 11/20/2016   LDLDIRECT 68.0 11/20/2016   LDLCALC 63 11/20/2016   ALT 33 11/20/2016   AST 27 11/20/2016   NA 141 11/20/2016   K 5.2 (H) 11/20/2016   CL 105 11/20/2016   CREATININE 0.96 11/20/2016   BUN 18 11/20/2016   CO2 31 11/20/2016   TSH 2.45 08/12/2007   PSA 0.27 08/29/2015   HGBA1C 6.7 (H) 11/20/2016   MICROALBUR 3.7 (H) 11/20/2016    No results found.  Assessment & Plan:   Problem List Items Addressed This Visit    Diabetes type 2, controlled ([place])    Well controlled on onglyza.  He has microalbuminuria and did not tolerate ace inhibitor,  His potassium was mildly elevated, which will need to be repeated prior to consideration of starting losartan   Lab Results  Component Value Date   HGBA1C 6.7 (H) 11/20/2016   Lab Results  Component Value Date   MICROALBUR 3.7 (H) 11/20/2016         Relevant Medications   aspirin EC 81 MG tablet   Other Relevant Orders   Hemoglobin A1c (Completed)   Microalbumin / creatinine urine ratio (Completed)   Hyperlipidemia associated with type 2 diabetes mellitus ([facility])    LDL and triglycerides are at goal on current medications. He has no side effects and liver enzymes are normal. No changes today   Lab Results  Component  Value Date   CHOL 120 11/20/2016   HDL 45.90 11/20/2016   LDLCALC 63 11/20/2016   LDLDIRECT 68.0 11/20/2016   TRIG 54.0 11/20/2016   CHOLHDL 3 11/20/2016   Lab Results  Component Value Date   ALT 33 11/20/2016   AST 27 11/20/2016   ALKPHOS 59 11/20/2016   BILITOT 1.0 11/20/2016          Relevant Medications   aspirin EC 81 MG tablet   Other Relevant Orders   LDL cholesterol, direct (Completed)   Lipid panel (Completed)   Hypertension - Primary    Well controlled on current regimen. Renal function stable, no changes today.  Lab Results  Component Value Date  CREATININE 0.96 11/20/2016   Lab Results  Component Value Date   NA 141 11/20/2016   K 5.2 (H) 11/20/2016   CL 105 11/20/2016   CO2 31 11/20/2016         Relevant Medications   aspirin EC 81 MG tablet   Other Relevant Orders   Comprehensive metabolic panel (Completed)   Overweight (BMI 25.0-29.9)    I have addressed  BMI and recommended a low glycemic index diet utilizing smaller more frequent meals to increase metabolism.  I have also encouraged patient to  start exercising with a goal of 30 minutes of aerobic exercise a minimum of 5 days per week.        Other Visit Diagnoses    Encounter for immunization       Relevant Orders   Flu vaccine HIGH DOSE PF (Completed)   Hyperkalemia       Relevant Orders   Basic metabolic panel     A total of 40 minutes was spent with patient more than half of which was spent in counseling patient on the above mentioned issues , reviewing and explaining recent labs and imaging studies done, and coordination of care. I have discontinued Mr. Dunlop's losartan and amoxicillin-clavulanate. I am also having him start on aspirin EC. Additionally, I am having him maintain his CoQ10, Fish Oil, fluticasone, Glucosamine-Chondroitin, JUICE PLUS FIBRE, Misc Natural Products (OSTEO BI-FLEX TRIPLE STRENGTH PO), vitamin C, vitamin E, glucose blood, simvastatin, and saxagliptin  HCl.  Meds ordered this encounter  Medications  . aspirin EC 81 MG tablet    Sig: Take 1 tablet (81 mg total) by mouth daily.    Dispense:  90 tablet    Refill:  4    Medications Discontinued During This Encounter  Medication Reason  . amoxicillin-clavulanate (AUGMENTIN) 875-125 MG tablet Allergic reaction  . losartan (COZAAR) 25 MG tablet     Follow-up: No Follow-up on file.   Crecencio Mc, MD

## 2016-11-20 NOTE — Patient Instructions (Addendum)
It was very nice seeing you today!  We made no changes to your current medicatioIns, but I do recommend taking a baby aspirin daily (81 mg) to prevent heart attacks and strokes  See you in 6 months   Merry Christmas!

## 2016-11-20 NOTE — Progress Notes (Signed)
Pre-visit discussion using our clinic review tool. No additional management support is needed unless otherwise documented below in the visit note.  

## 2016-11-23 DIAGNOSIS — E663 Overweight: Secondary | ICD-10-CM | POA: Insufficient documentation

## 2016-11-23 NOTE — Assessment & Plan Note (Signed)
I have addressed  BMI and recommended a low glycemic index diet utilizing smaller more frequent meals to increase metabolism.  I have also encouraged patient to  start exercising with a goal of 30 minutes of aerobic exercise a minimum of 5 days per week.

## 2016-11-23 NOTE — Assessment & Plan Note (Signed)
Well controlled on current regimen. Renal function stable, no changes today.  Lab Results  Component Value Date   CREATININE 0.96 11/20/2016   Lab Results  Component Value Date   NA 141 11/20/2016   K 5.2 (H) 11/20/2016   CL 105 11/20/2016   CO2 31 11/20/2016

## 2016-11-23 NOTE — Assessment & Plan Note (Signed)
Well controlled on onglyza.  He has microalbuminuria and did not tolerate ace inhibitor,  His potassium was mildly elevated, which will need to be repeated prior to consideration of starting losartan   Lab Results  Component Value Date   HGBA1C 6.7 (H) 11/20/2016   Lab Results  Component Value Date   MICROALBUR 3.7 (H) 11/20/2016

## 2016-11-23 NOTE — Assessment & Plan Note (Signed)
LDL and triglycerides are at goal on current medications. He has no side effects and liver enzymes are normal. No changes today   Lab Results  Component Value Date   CHOL 120 11/20/2016   HDL 45.90 11/20/2016   LDLCALC 63 11/20/2016   LDLDIRECT 68.0 11/20/2016   TRIG 54.0 11/20/2016   CHOLHDL 3 11/20/2016   Lab Results  Component Value Date   ALT 33 11/20/2016   AST 27 11/20/2016   ALKPHOS 59 11/20/2016   BILITOT 1.0 11/20/2016

## 2016-11-27 LAB — HM DIABETES EYE EXAM

## 2016-11-28 DIAGNOSIS — E119 Type 2 diabetes mellitus without complications: Secondary | ICD-10-CM | POA: Diagnosis not present

## 2016-11-29 ENCOUNTER — Encounter: Payer: Self-pay | Admitting: Internal Medicine

## 2016-12-04 ENCOUNTER — Other Ambulatory Visit (INDEPENDENT_AMBULATORY_CARE_PROVIDER_SITE_OTHER): Payer: Medicare Other

## 2016-12-04 DIAGNOSIS — E875 Hyperkalemia: Secondary | ICD-10-CM

## 2016-12-04 LAB — BASIC METABOLIC PANEL
BUN: 14 mg/dL (ref 6–23)
CHLORIDE: 104 meq/L (ref 96–112)
CO2: 31 mEq/L (ref 19–32)
Calcium: 9.2 mg/dL (ref 8.4–10.5)
Creatinine, Ser: 0.94 mg/dL (ref 0.40–1.50)
GFR: 83.64 mL/min (ref >60.00)
Glucose, Bld: 147 mg/dL — ABNORMAL HIGH (ref 70–99)
POTASSIUM: 4.7 meq/L (ref 3.5–5.1)
Sodium: 141 mEq/L (ref 135–145)

## 2016-12-05 ENCOUNTER — Other Ambulatory Visit: Payer: Self-pay

## 2016-12-05 ENCOUNTER — Encounter: Payer: Self-pay | Admitting: Internal Medicine

## 2016-12-05 ENCOUNTER — Other Ambulatory Visit: Payer: Self-pay | Admitting: Internal Medicine

## 2016-12-05 DIAGNOSIS — R5383 Other fatigue: Secondary | ICD-10-CM

## 2016-12-05 DIAGNOSIS — E875 Hyperkalemia: Secondary | ICD-10-CM

## 2016-12-05 MED ORDER — SAXAGLIPTIN HCL 5 MG PO TABS: ORAL_TABLET | ORAL | 1 refills | Status: DC

## 2016-12-06 ENCOUNTER — Telehealth: Payer: Self-pay | Admitting: Internal Medicine

## 2016-12-06 NOTE — Telephone Encounter (Signed)
Please advise 

## 2016-12-06 NOTE — Telephone Encounter (Signed)
Pt called to make appt for lab work but she wants to Hep C blood draw. Please advise, thank you!  Call pt @ [phone]

## 2016-12-06 NOTE — Telephone Encounter (Signed)
Hep c added.  Patient needs lab appt , non fasting next week

## 2016-12-13 ENCOUNTER — Other Ambulatory Visit (INDEPENDENT_AMBULATORY_CARE_PROVIDER_SITE_OTHER): Payer: Medicare Other

## 2016-12-13 DIAGNOSIS — E875 Hyperkalemia: Secondary | ICD-10-CM | POA: Diagnosis not present

## 2016-12-13 DIAGNOSIS — R5383 Other fatigue: Secondary | ICD-10-CM

## 2016-12-13 LAB — BASIC METABOLIC PANEL
BUN: 19 mg/dL (ref 6–23)
CALCIUM: 9.3 mg/dL (ref 8.4–10.5)
CO2: 29 mEq/L (ref 19–32)
Chloride: 106 mEq/L (ref 96–112)
Creatinine, Ser: 0.99 mg/dL (ref 0.40–1.50)
GFR: 78.78 mL/min (ref >60.00)
Glucose, Bld: 129 mg/dL — ABNORMAL HIGH (ref 70–99)
POTASSIUM: 4.4 meq/L (ref 3.5–5.1)
SODIUM: 140 meq/L (ref 135–145)

## 2016-12-14 LAB — HEPATITIS C ANTIBODY: HCV AB: NEGATIVE

## 2016-12-16 ENCOUNTER — Encounter: Payer: Self-pay | Admitting: Internal Medicine

## 2016-12-18 ENCOUNTER — Other Ambulatory Visit: Payer: Self-pay | Admitting: Internal Medicine

## 2016-12-18 NOTE — Telephone Encounter (Signed)
error 

## 2016-12-19 DIAGNOSIS — M9905 Segmental and somatic dysfunction of pelvic region: Secondary | ICD-10-CM | POA: Diagnosis not present

## 2016-12-19 DIAGNOSIS — M955 Acquired deformity of pelvis: Secondary | ICD-10-CM | POA: Diagnosis not present

## 2016-12-19 DIAGNOSIS — M9901 Segmental and somatic dysfunction of cervical region: Secondary | ICD-10-CM | POA: Diagnosis not present

## 2016-12-19 DIAGNOSIS — M5136 Other intervertebral disc degeneration, lumbar region: Secondary | ICD-10-CM | POA: Diagnosis not present

## 2016-12-19 DIAGNOSIS — M531 Cervicobrachial syndrome: Secondary | ICD-10-CM | POA: Diagnosis not present

## 2016-12-19 DIAGNOSIS — M9903 Segmental and somatic dysfunction of lumbar region: Secondary | ICD-10-CM | POA: Diagnosis not present

## 2016-12-19 MED ORDER — LOSARTAN POTASSIUM 25 MG PO TABS: 25.0000 mg | ORAL_TABLET | Freq: Every day | ORAL | 0 refills | Status: DC

## 2016-12-19 NOTE — Telephone Encounter (Signed)
30 days sent to [facility],  If tolerates and if repeat bp is 120/70 after one week will refill same dose through mail order

## 2017-01-16 ENCOUNTER — Other Ambulatory Visit: Payer: Self-pay | Admitting: Internal Medicine

## 2017-02-13 DIAGNOSIS — M9901 Segmental and somatic dysfunction of cervical region: Secondary | ICD-10-CM | POA: Diagnosis not present

## 2017-02-13 DIAGNOSIS — M5136 Other intervertebral disc degeneration, lumbar region: Secondary | ICD-10-CM | POA: Diagnosis not present

## 2017-02-13 DIAGNOSIS — M9903 Segmental and somatic dysfunction of lumbar region: Secondary | ICD-10-CM | POA: Diagnosis not present

## 2017-02-13 DIAGNOSIS — M9905 Segmental and somatic dysfunction of pelvic region: Secondary | ICD-10-CM | POA: Diagnosis not present

## 2017-02-13 DIAGNOSIS — M955 Acquired deformity of pelvis: Secondary | ICD-10-CM | POA: Diagnosis not present

## 2017-02-13 DIAGNOSIS — M531 Cervicobrachial syndrome: Secondary | ICD-10-CM | POA: Diagnosis not present

## 2017-03-12 ENCOUNTER — Other Ambulatory Visit: Payer: Self-pay

## 2017-03-12 MED ORDER — SIMVASTATIN 40 MG PO TABS: ORAL_TABLET | ORAL | 1 refills | Status: DC

## 2017-03-31 DIAGNOSIS — M531 Cervicobrachial syndrome: Secondary | ICD-10-CM | POA: Diagnosis not present

## 2017-03-31 DIAGNOSIS — M9901 Segmental and somatic dysfunction of cervical region: Secondary | ICD-10-CM | POA: Diagnosis not present

## 2017-03-31 DIAGNOSIS — Z86010 Personal history of colonic polyps: Secondary | ICD-10-CM | POA: Insufficient documentation

## 2017-03-31 DIAGNOSIS — M9905 Segmental and somatic dysfunction of pelvic region: Secondary | ICD-10-CM | POA: Diagnosis not present

## 2017-03-31 DIAGNOSIS — M5136 Other intervertebral disc degeneration, lumbar region: Secondary | ICD-10-CM | POA: Diagnosis not present

## 2017-03-31 DIAGNOSIS — M9903 Segmental and somatic dysfunction of lumbar region: Secondary | ICD-10-CM | POA: Diagnosis not present

## 2017-03-31 DIAGNOSIS — M955 Acquired deformity of pelvis: Secondary | ICD-10-CM | POA: Diagnosis not present

## 2017-04-08 ENCOUNTER — Other Ambulatory Visit: Payer: Self-pay

## 2017-04-08 MED ORDER — LOSARTAN POTASSIUM 25 MG PO TABS: 25.0000 mg | ORAL_TABLET | Freq: Every day | ORAL | 0 refills | Status: DC

## 2017-04-14 ENCOUNTER — Telehealth: Payer: Self-pay | Admitting: Internal Medicine

## 2017-04-14 NOTE — Telephone Encounter (Signed)
Left pt message asking to call Allison back directly at [phone] to schedule AWV. Thanks! °

## 2017-04-28 ENCOUNTER — Ambulatory Visit (INDEPENDENT_AMBULATORY_CARE_PROVIDER_SITE_OTHER): Payer: Medicare Other

## 2017-04-28 VITALS — BP 138/78 | HR 63 | Temp 98.5°F | Resp 14 | Ht 70.5 in | Wt 223.4 lb

## 2017-04-28 DIAGNOSIS — Z1389 Encounter for screening for other disorder: Secondary | ICD-10-CM | POA: Diagnosis not present

## 2017-04-28 DIAGNOSIS — Z0000 Encounter for general adult medical examination without abnormal findings: Secondary | ICD-10-CM

## 2017-04-28 NOTE — Progress Notes (Signed)
Subjective:   Richard Hardy is a 71 y.o. male who presents for Medicare Annual/Subsequent preventive examination.  Review of Systems:  No ROS.  Medicare Wellness Visit.  Cardiac Risk Factors include: advanced age (>3men, >83 women);hypertension;male gender;obesity (BMI >30kg/m2)     Objective:    Vitals: BP 138/78 (BP Location: Left Arm, Patient Position: Sitting, Cuff Size: Normal)   Pulse 63   Temp 98.5 F (36.9 C) (Oral)   Resp 14   Ht 5' 10.5" (1.791 m)   Wt 223 lb 6.4 oz (101.3 kg)   SpO2 95%   BMI 31.60 kg/m   Body mass index is 31.6 kg/m.  Tobacco History  Smoking Status  . Former Smoker  . Packs/day: 0.50  . Years: 4.00  . Quit date: 12/10/1963  Smokeless Tobacco  . Never Used     Counseling given: Not Answered   Past Medical History:  Diagnosis Date  . Diabetes mellitus without complication ([place]) 0932  . Glucose intolerance (impaired glucose tolerance)   . Hyperlipidemia   . Obesity   . Sleep apnea    On BiPAP, Dr. Richardson Landry  . Thrombocythemia ([place])    Dr. Grayland Ormond   Past Surgical History:  Procedure Laterality Date  . CATARACT EXTRACTION, BILATERAL    . LITHOTRIPSY     Dr. Bernardo Heater  . NASAL SEPTUM SURGERY     Family History  Problem Relation Age of Onset  . Cancer Mother        brain  . Heart attack Father 53  . Heart disease Father   . Heart disease Brother        s/p CABG  . Crohn's disease Brother   . Hyperlipidemia Son   . Hypertension Son   . Sleep apnea Son   . Thyroid disease Son   . Heart disease Paternal Grandfather    History  Sexual Activity  . Sexual activity: Not on file    Outpatient Encounter Prescriptions as of 04/28/2017  Medication Sig  . aspirin EC 81 MG tablet Take 1 tablet (81 mg total) by mouth daily.  Marland Kitchen CINNAMON PO Take 2 capsules by mouth daily.  . Coenzyme Q10 (COQ10) 200 MG CAPS Take 1 tablet by mouth daily.    . Fish Oil OIL Take by mouth daily.    . fluticasone (FLONASE) 50 MCG/ACT nasal spray Place  into the nose at bedtime.    . Glucosamine-Chondroit-Vit C-Mn (GLUCOSAMINE-CHONDROITIN) CAPS Take 2 capsules by mouth daily.    Marland Kitchen glucose blood (BAYER CONTOUR TEST) test strip Check blood sugar 1-2 times daily dx: 250.00  . losartan (COZAAR) 25 MG tablet Take 1 tablet (25 mg total) by mouth daily.  . Misc Natural Products (OSTEO BI-FLEX TRIPLE STRENGTH PO) Take by mouth daily.  . Nutritional Supplements (NUTRITIONAL DRINK PO) Take 2 packets by mouth daily.  . saxagliptin HCl (ONGLYZA) 5 MG TABS tablet TAKE 1 BY MOUTH DAILY  . simvastatin (ZOCOR) 40 MG tablet TAKE 1 BY MOUTH DAILY AT 6PM  . vitamin C (ASCORBIC ACID) 500 MG tablet Take 500 mg by mouth daily.  . vitamin E (VITAMIN E) 400 UNIT capsule Take 400 Units by mouth daily.  . [DISCONTINUED] Nutritional Supplements (JUICE PLUS FIBRE) LIQD Take 2 tablets by mouth 2 (two) times daily.     No facility-administered encounter medications on file as of 04/28/2017.     Activities of Daily Living In your present state of health, do you have any difficulty performing the following activities: 04/28/2017  Hearing? N  Vision? N  Difficulty concentrating or making decisions? N  Walking or climbing stairs? N  Dressing or bathing? N  Doing errands, shopping? N  Preparing Food and eating ? N  Using the Toilet? N  In the past six months, have you accidently leaked urine? N  Do you have problems with loss of bowel control? N  Managing your Medications? N  Managing your Finances? N  Housekeeping or managing your Housekeeping? N  Some recent data might be hidden    Patient Care Team: Crecencio Mc, MD as PCP - General (Internal Medicine) Jackolyn Confer, MD (Internal Medicine) Minna Merritts, MD as Consulting Physician (Cardiology)   Assessment:    This is a routine wellness examination for [name]. The goal of the wellness visit is to assist the patient how to close the gaps in care and create a preventative care plan for the patient.    Osteoporosis risk reviewed.  Medications reviewed; taking without issues or barriers.  Safety issues reviewed; smoke detectors in the home. Firearms locked up in the home. Wears seatbelts when driving or riding with others. Patient does wear sunscreen or protective clothing when in direct sunlight. No violence in the home.  Depression- PHQ 2 &9 complete.  No signs/symptoms or verbal communication regarding little pleasure in doing things, feeling down, depressed or hopeless. No changes in sleeping, energy, eating, concentrating.  No thoughts of self harm or harm towards others.  Time spent on this topic is 9 minutes.   Patient is alert, normal appearance, oriented to person/place/and time. Correctly identified the president of the [place], recall of 3/3 words, and performing simple calculations.  Patient displays appropriate judgement and can read correct time from watch face.  No new identified risk were noted.  No failures at ADL's or IADL's.   BMI- discussed the importance of a healthy diet, water intake and exercise. Educational material provided.   Diet: Breakfast: Cereal Lunch: Grilled chicken salad, potato Dinner: Sandwich, fruit Daily fluid intake: 0 cups of caffeine, 5 cups of water  HTN- followed by PCP.  Dental- every six months.    Eye- Visual acuity not assessed per patient preference since they have regular follow up with the ophthalmologist.  Wears corrective lenses.  Sleep patterns- Sleeps 7-8 hours at night.  Wakes feeling rested. CPAP in use.  Health maintenance gaps- closed.  Patient Concerns: None at this time. Follow up with PCP as needed.  Exercise Activities and Dietary recommendations Current Exercise Habits: Home exercise routine, Time (Minutes): 20, Frequency (Times/Week): 5 (Works 5 days a week), Weekly Exercise (Minutes/Week): 100, Intensity: Moderate  Goals    . Healthy Lifestyle          Low carb foods Stay hydrated and drink plenty of  water Stay active and continue walking for exercise      Fall Risk Fall Risk  04/28/2017 05/20/2016 08/29/2015 02/22/2015  Falls in the past year? No No No No   Depression Screen PHQ 2/9 Scores 04/28/2017 05/20/2016 08/29/2015 02/22/2015  PHQ - 2 Score 0 0 0 0  PHQ- 9 Score 0 - - -    Cognitive Function MMSE - Mini Mental State Exam 04/28/2017  Orientation to time 5  Orientation to Place 5  Registration 3  Attention/ Calculation 5  Recall 3  Language- name 2 objects 2  Language- repeat 1  Language- follow 3 step command 3  Language- read & follow direction 1  Write a sentence 1  Copy  design 1  Total score 30        Immunization History  Administered Date(s) Administered  . Influenza, High Dose Seasonal PF 11/20/2016  . Influenza,inj,Quad PF,36+ Mos 08/26/2014, 08/29/2015  . Influenza-Unspecified 09/20/2012, 09/24/2013  . Pneumococcal Conjugate-13 05/25/2014  . Pneumococcal Polysaccharide-23 07/21/2012  . Tdap 06/20/2013  . Zoster 06/20/2013   Screening Tests Health Maintenance  Topic Date Due  . HEMOGLOBIN A1C  05/21/2017  . INFLUENZA VACCINE  07/09/2017  . FOOT EXAM  11/20/2017  . OPHTHALMOLOGY EXAM  11/27/2017  . COLONOSCOPY  07/21/2022  . TETANUS/TDAP  06/21/2023  . Hepatitis C Screening  Completed      Plan:   .End of life planning; Advance aging; Advanced directives discussed. Copy of current HCPOA/Living Will on file.    I have personally reviewed and noted the following in the patient's chart:   . Medical and social history . Use of alcohol, tobacco or illicit drugs  . Current medications and supplements . Functional ability and status . Nutritional status . Physical activity . Advanced directives . List of other physicians . Hospitalizations, surgeries, and ER visits in previous 12 months . Vitals . Screenings to include cognitive, depression, and falls . Referrals and appointments  In addition, I have reviewed and discussed with patient  certain preventive protocols, quality metrics, and best practice recommendations. A written personalized care plan for preventive services as well as general preventive health recommendations were provided to patient.     Varney Biles, LPN  01/26/2882

## 2017-04-28 NOTE — Patient Instructions (Addendum)
  Richard Hardy , Thank you for taking time to come for your Medicare Wellness Visit. I appreciate your ongoing commitment to your health goals. Please review the following plan we discussed and let me know if I can assist you in the future.   Follow up with Dr. Derrel Nip as needed.    Have a great day!  These are the goals we discussed: Goals    . Healthy Lifestyle          Low carb foods Stay hydrated and drink plenty of water Stay active and continue walking for exercise       This is a list of the screening recommended for you and due dates:  Health Maintenance  Topic Date Due  . Hemoglobin A1C  05/21/2017  . Flu Shot  07/09/2017  . Complete foot exam   11/20/2017  . Eye exam for diabetics  11/27/2017  . Colon Cancer Screening  07/21/2022  . Tetanus Vaccine  06/21/2023  .  Hepatitis C: One time screening is recommended by Center for Disease Control  (CDC) for  adults born from [birth year] through 1967.   Completed

## 2017-05-14 NOTE — Telephone Encounter (Signed)
Seen 5 21 18 

## 2017-05-19 DIAGNOSIS — M9903 Segmental and somatic dysfunction of lumbar region: Secondary | ICD-10-CM | POA: Diagnosis not present

## 2017-05-19 DIAGNOSIS — M9901 Segmental and somatic dysfunction of cervical region: Secondary | ICD-10-CM | POA: Diagnosis not present

## 2017-05-19 DIAGNOSIS — M5136 Other intervertebral disc degeneration, lumbar region: Secondary | ICD-10-CM | POA: Diagnosis not present

## 2017-05-19 DIAGNOSIS — M9905 Segmental and somatic dysfunction of pelvic region: Secondary | ICD-10-CM | POA: Diagnosis not present

## 2017-05-19 DIAGNOSIS — M531 Cervicobrachial syndrome: Secondary | ICD-10-CM | POA: Diagnosis not present

## 2017-05-19 DIAGNOSIS — M955 Acquired deformity of pelvis: Secondary | ICD-10-CM | POA: Diagnosis not present

## 2017-05-21 ENCOUNTER — Ambulatory Visit (INDEPENDENT_AMBULATORY_CARE_PROVIDER_SITE_OTHER): Payer: Medicare Other | Admitting: Internal Medicine

## 2017-05-21 ENCOUNTER — Encounter: Payer: Self-pay | Admitting: Internal Medicine

## 2017-05-21 ENCOUNTER — Other Ambulatory Visit: Payer: Self-pay | Admitting: Internal Medicine

## 2017-05-21 VITALS — BP 150/88 | HR 44 | Temp 97.5°F | Resp 16 | Ht 70.5 in | Wt 214.4 lb

## 2017-05-21 DIAGNOSIS — E785 Hyperlipidemia, unspecified: Secondary | ICD-10-CM | POA: Diagnosis not present

## 2017-05-21 DIAGNOSIS — E1169 Type 2 diabetes mellitus with other specified complication: Secondary | ICD-10-CM

## 2017-05-21 DIAGNOSIS — T50905A Adverse effect of unspecified drugs, medicaments and biological substances, initial encounter: Secondary | ICD-10-CM | POA: Insufficient documentation

## 2017-05-21 DIAGNOSIS — E1129 Type 2 diabetes mellitus with other diabetic kidney complication: Secondary | ICD-10-CM | POA: Diagnosis not present

## 2017-05-21 DIAGNOSIS — I10 Essential (primary) hypertension: Secondary | ICD-10-CM | POA: Diagnosis not present

## 2017-05-21 DIAGNOSIS — I471 Supraventricular tachycardia: Secondary | ICD-10-CM

## 2017-05-21 DIAGNOSIS — E875 Hyperkalemia: Secondary | ICD-10-CM

## 2017-05-21 DIAGNOSIS — R809 Proteinuria, unspecified: Secondary | ICD-10-CM

## 2017-05-21 DIAGNOSIS — E119 Type 2 diabetes mellitus without complications: Secondary | ICD-10-CM

## 2017-05-21 HISTORY — DX: Hyperkalemia: E87.5

## 2017-05-21 LAB — COMPREHENSIVE METABOLIC PANEL
ALT: 29 U/L (ref 0–53)
AST: 24 U/L (ref 0–37)
Albumin: 4.4 g/dL (ref 3.5–5.2)
Alkaline Phosphatase: 51 U/L (ref 39–117)
BUN: 17 mg/dL (ref 6–23)
CHLORIDE: 106 meq/L (ref 96–112)
CO2: 31 mEq/L (ref 19–32)
Calcium: 9.7 mg/dL (ref 8.4–10.5)
Creatinine, Ser: 0.9 mg/dL (ref 0.40–1.50)
GFR: 87.84 mL/min (ref >60.00)
GLUCOSE: 129 mg/dL — AB (ref 70–99)
POTASSIUM: 5.2 meq/L — AB (ref 3.5–5.1)
Sodium: 140 mEq/L (ref 135–145)
TOTAL PROTEIN: 6.9 g/dL (ref 6.0–8.3)
Total Bilirubin: 0.8 mg/dL (ref 0.2–1.2)

## 2017-05-21 LAB — MICROALBUMIN / CREATININE URINE RATIO
Creatinine,U: 145.7 mg/dL
MICROALB/CREAT RATIO: 1.4 mg/g (ref 0.0–30.0)
Microalb, Ur: 2 mg/dL — ABNORMAL HIGH (ref 0.0–1.9)

## 2017-05-21 LAB — LIPID PANEL
CHOL/HDL RATIO: 3
Cholesterol: 110 mg/dL (ref 0–200)
HDL: 40.5 mg/dL (ref >39.00)
LDL Cholesterol: 61 mg/dL (ref 0–99)
NonHDL: 69.85
TRIGLYCERIDES: 44 mg/dL (ref 0.0–149.0)
VLDL: 8.8 mg/dL (ref 0.0–40.0)

## 2017-05-21 LAB — HEMOGLOBIN A1C: HEMOGLOBIN A1C: 6.8 % — AB (ref 4.6–6.5)

## 2017-05-21 MED ORDER — AMLODIPINE BESYLATE 5 MG PO TABS: 5.0000 mg | ORAL_TABLET | Freq: Every day | ORAL | 3 refills | Status: DC

## 2017-05-21 MED ORDER — GLUCOSE BLOOD VI STRP: ORAL_STRIP | 12 refills | Status: DC

## 2017-05-21 NOTE — Assessment & Plan Note (Addendum)
Mild,  Recurrent, secondary to  losartan.  Will stop medication and repeat

## 2017-05-21 NOTE — Patient Instructions (Addendum)
  The new goals for optimal blood pressure management are 130/80 or less.  Please suspend any oral decongestants (use Afrin and Flonase instead) and check your blood pressure a few times at home and send me the readings so I can determine if you need a change in medication If the readings are all < 130/80,  I will have you bring your home blood pressure monitor in with you for an RN visit so we can calibrate it.     There are plenty of high protein low carb cookies,  But they're not called "cookies."  Look for them in the diet section  where the protein shakes are  Sold.   All of these have 5 g sugar or less : Power crunch Atkins bars KIND :thE  "low glycemic index"  variety QUEST : (taste better after being microwaved OUT OF THE WRAPPER)    You might want to try a premixed protein drink called Premier Protein shake in the morning.  It is less $$$ and very low sugar.    160 cal  30 g protein  1 g sugar 50% calcium needs

## 2017-05-21 NOTE — Progress Notes (Signed)
Subjective:  Patient ID: Richard Hardy, male    DOB: [birth year]  Age: 71 y.o. MRN: [id number]  CC: The primary encounter diagnosis was Diabetes mellitus without complication ([place]). Diagnoses of Hyperlipidemia associated with type 2 diabetes mellitus ([place]), Paroxysmal supraventricular tachycardia ([place]), Essential hypertension, Controlled type 2 diabetes mellitus with microalbuminuria, without long-term current use of insulin ([place]), and Hyperkalemia were also pertinent to this visit.  HPI [name] presents for 6 month follow up on diabetes.  Patient has no complaints today.  Patient is following a low glycemic index diet and taking Onglyza as prescribed regularly without side effects.  Fasting sugars have been under less than 140 most of the time and post prandials have been under 160 except on rare occasions. Patient is exercising about 3 times per week and intentionally trying to lose weight .  Patient has had an eye exam in the last 12 months and checks feet regularly for signs of infection.  Patient does not walk barefoot outside,  And denies an numbness tingling or burning in feet. Patient is up to date on all recommended vaccinations.  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   DENIES CHEST PAIN , DYSPNEA AND LEG PAIN   Blood pressure has been  elevated  Taking a decongestant  fo rthe last several days.  Taking losartan 25 mg daily does not check bp   Lab Results  Component Value Date   HGBA1C 6.8 (H) 05/21/2017    LAST EYE EXAM no retinopathy Dec 2017   Outpatient Medications Prior to Visit  Medication Sig Dispense Refill  . aspirin EC 81 MG tablet Take 1 tablet (81 mg total) by mouth daily. 90 tablet 4  . CINNAMON PO Take 2 capsules by mouth daily.    . Coenzyme Q10 (COQ10) 200 MG CAPS Take 1 tablet by mouth daily.      . Fish Oil OIL Take by mouth daily.      . fluticasone (FLONASE) 50 MCG/ACT nasal spray Place into the  nose at bedtime.      . Glucosamine-Chondroit-Vit C-Mn (GLUCOSAMINE-CHONDROITIN) CAPS Take 2 capsules by mouth daily.      Marland Kitchen losartan (COZAAR) 25 MG tablet Take 1 tablet (25 mg total) by mouth daily. 90 tablet 0  . Misc Natural Products (OSTEO BI-FLEX TRIPLE STRENGTH PO) Take by mouth daily.    . Nutritional Supplements (NUTRITIONAL DRINK PO) Take 2 packets by mouth daily.    . saxagliptin HCl (ONGLYZA) 5 MG TABS tablet TAKE 1 BY MOUTH DAILY 90 tablet 1  . simvastatin (ZOCOR) 40 MG tablet TAKE 1 BY MOUTH DAILY AT 6PM 90 tablet 1  . vitamin C (ASCORBIC ACID) 500 MG tablet Take 500 mg by mouth daily.    Marland Kitchen glucose blood (BAYER CONTOUR TEST) test strip Check blood sugar 1-2 times daily dx: 250.00 100 each 12  . vitamin E (VITAMIN E) 400 UNIT capsule Take 400 Units by mouth daily.     No facility-administered medications prior to visit.     Review of Systems;  Patient denies headache, fevers, malaise, unintentional weight loss, skin rash, eye pain, sinus congestion and sinus pain, sore throat, dysphagia,  hemoptysis , cough, dyspnea, wheezing, chest pain, palpitations, orthopnea, edema, abdominal pain, nausea, melena, diarrhea, constipation, flank pain, dysuria, hematuria, urinary  Frequency, nocturia, numbness, tingling, seizures,  Focal weakness, Loss of consciousness,  Tremor, insomnia, depression, anxiety, and suicidal ideation.  Objective:  BP (!) 150/88 (BP Location: Left Arm, Patient Position: Sitting, Cuff Size: Normal)   Pulse (!) 44   Temp 97.5 F (36.4 C) (Oral)   Resp 16   Ht 5' 10.5" (1.791 m)   Wt 214 lb 6.4 oz (97.3 kg)   SpO2 96%   BMI 30.33 kg/m   BP Readings from Last 3 Encounters:  05/21/17 (!) 150/88  04/28/17 138/78  11/20/16 120/74    Wt Readings from Last 3 Encounters:  05/21/17 214 lb 6.4 oz (97.3 kg)  04/28/17 223 lb 6.4 oz (101.3 kg)  11/20/16 210 lb (95.3 kg)    General appearance: alert, cooperative and appears stated age Ears: normal TM's and  external ear canals both ears Throat: lips, mucosa, and tongue normal; teeth and gums normal Neck: no adenopathy, no carotid bruit, supple, symmetrical, trachea midline and thyroid not enlarged, symmetric, no tenderness/mass/nodules Back: symmetric, no curvature. ROM normal. No CVA tenderness. Lungs: clear to auscultation bilaterally Heart: regular rate and rhythm, S1, S2 normal, no murmur, click, rub or gallop Abdomen: soft, non-tender; bowel sounds normal; no masses,  no organomegaly Pulses: 2+ and symmetric Skin: Skin color, texture, turgor normal. No rashes or lesions Lymph nodes: Cervical, supraclavicular, and axillary nodes normal.  Lab Results  Component Value Date   HGBA1C 6.8 (H) 05/21/2017   HGBA1C 6.7 (H) 11/20/2016   HGBA1C 6.7 (H) 05/20/2016    Lab Results  Component Value Date   CREATININE 0.90 05/21/2017   CREATININE 0.99 12/13/2016   CREATININE 0.94 12/04/2016    Lab Results  Component Value Date   WBC 5.7 08/29/2015   HGB 15.6 08/29/2015   HCT 45.3 08/29/2015   PLT 167.0 08/29/2015   GLUCOSE 129 (H) 05/21/2017   CHOL 110 05/21/2017   TRIG 44.0 05/21/2017   HDL 40.50 05/21/2017   LDLDIRECT 68.0 11/20/2016   LDLCALC 61 05/21/2017   ALT 29 05/21/2017   AST 24 05/21/2017   NA 140 05/21/2017   K 5.2 (H) 05/21/2017   CL 106 05/21/2017   CREATININE 0.90 05/21/2017   BUN 17 05/21/2017   CO2 31 05/21/2017   TSH 2.45 08/12/2007   PSA 0.27 08/29/2015   HGBA1C 6.8 (H) 05/21/2017   MICROALBUR 2.0 (H) 05/21/2017    No results found.  Assessment & Plan:   Problem List Items Addressed This Visit    SVT/ PSVT/ PAT   Hypertension    Not at goal.  May be due to use of decongestants.  Changing to amlodipine given recurrent elevation of potassium on losartan repeat trial   Lab Results  Component Value Date   CREATININE 0.90 05/21/2017   Lab Results  Component Value Date   NA 140 05/21/2017   K 5.2 (H) 05/21/2017   CL 106 05/21/2017   CO2 31 05/21/2017     Lab Results  Component Value Date   MICROALBUR 2.0 (H) 05/21/2017         Hyperlipidemia associated with type 2 diabetes mellitus ([facility])    LDL and triglycerides are at goal on current medications. He has no side effects and liver enzymes are normal. No changes today   Lab Results  Component Value Date   CHOL 110 05/21/2017   HDL 40.50 05/21/2017   LDLCALC 61 05/21/2017   LDLDIRECT 68.0 11/20/2016   TRIG 44.0 05/21/2017   CHOLHDL 3 05/21/2017   Lab Results  Component Value Date   ALT 29 05/21/2017   AST 24 05/21/2017   ALKPHOS 51  05/21/2017   BILITOT 0.8 05/21/2017          Relevant Orders   Lipid panel (Completed)   Hyperkalemia    Mild,  Recurrent, secondary to  losartan.  Will stop medication and repeat       Controlled diabetes mellitus with microalbuminuria ([place])    Well controlled on onglyza.  He has microalbuminuria and did not tolerate ace inhibitor,  And has had repeated elevations in his  potassium on losartan   Lab Results  Component Value Date   HGBA1C 6.8 (H) 05/21/2017   Lab Results  Component Value Date   MICROALBUR 2.0 (H) 05/21/2017          Other Visit Diagnoses    Diabetes mellitus without complication ([place])    -  Primary   Relevant Medications   glucose blood (BAYER CONTOUR TEST) test strip   Other Relevant Orders   Comprehensive metabolic panel (Completed)   Hemoglobin A1c (Completed)   Microalbumin / creatinine urine ratio (Completed)      I have discontinued Mr. Hottle's vitamin E. I am also having him maintain his CoQ10, Fish Oil, fluticasone, Glucosamine-Chondroitin, Misc Natural Products (OSTEO BI-FLEX TRIPLE STRENGTH PO), vitamin C, aspirin EC, saxagliptin HCl, simvastatin, losartan, Nutritional Supplements (NUTRITIONAL DRINK PO), CINNAMON PO, GLUCOSAMINE SULFATE PO, and glucose blood.  Meds ordered this encounter  Medications  . GLUCOSAMINE SULFATE PO    Sig: Take by mouth.  Marland Kitchen glucose blood (BAYER CONTOUR TEST) test  strip    Sig: Check blood sugar 1-2 times daily dx: 250.00    Dispense:  100 each    Refill:  12    Medications Discontinued During This Encounter  Medication Reason  . vitamin E (VITAMIN E) 400 UNIT capsule Patient has not taken in last 30 days  . glucose blood (BAYER CONTOUR TEST) test strip Reorder    Follow-up: No Follow-up on file.   Crecencio Mc, MD

## 2017-05-21 NOTE — Assessment & Plan Note (Signed)
LDL and triglycerides are at goal on current medications. He has no side effects and liver enzymes are normal. No changes today   Lab Results  Component Value Date   CHOL 110 05/21/2017   HDL 40.50 05/21/2017   LDLCALC 61 05/21/2017   LDLDIRECT 68.0 11/20/2016   TRIG 44.0 05/21/2017   CHOLHDL 3 05/21/2017   Lab Results  Component Value Date   ALT 29 05/21/2017   AST 24 05/21/2017   ALKPHOS 51 05/21/2017   BILITOT 0.8 05/21/2017

## 2017-05-21 NOTE — Assessment & Plan Note (Signed)
Well controlled on onglyza.  He has microalbuminuria and did not tolerate ace inhibitor,  And has had repeated elevations in his  potassium on losartan   Lab Results  Component Value Date   HGBA1C 6.8 (H) 05/21/2017   Lab Results  Component Value Date   MICROALBUR 2.0 (H) 05/21/2017

## 2017-05-21 NOTE — Assessment & Plan Note (Addendum)
Not at goal.  May be due to use of decongestants.  Changing to amlodipine given recurrent elevation of potassium on losartan repeat trial   Lab Results  Component Value Date   CREATININE 0.90 05/21/2017   Lab Results  Component Value Date   NA 140 05/21/2017   K 5.2 (H) 05/21/2017   CL 106 05/21/2017   CO2 31 05/21/2017   Lab Results  Component Value Date   MICROALBUR 2.0 (H) 05/21/2017

## 2017-05-22 ENCOUNTER — Encounter: Payer: Self-pay | Admitting: Internal Medicine

## 2017-05-22 DIAGNOSIS — E119 Type 2 diabetes mellitus without complications: Secondary | ICD-10-CM

## 2017-05-23 MED ORDER — GLUCOSE BLOOD VI STRP: ORAL_STRIP | 12 refills | Status: DC

## 2017-05-27 ENCOUNTER — Other Ambulatory Visit: Payer: Self-pay

## 2017-05-27 MED ORDER — SAXAGLIPTIN HCL 5 MG PO TABS: ORAL_TABLET | ORAL | 1 refills | Status: DC

## 2017-06-03 ENCOUNTER — Ambulatory Visit: Payer: Medicare Other | Admitting: Cardiovascular Disease

## 2017-06-18 NOTE — Progress Notes (Signed)
Patient ID: Richard Hardy, male   DOB: 02/06/1946, 71 y.o.   MRN: [id number] Cardiology Office Note  Date:  06/19/2017   ID:  Richard Hardy, Richard Hardy Sep 11, 1946, MRN [id number]  PCP:  Richard Mc, MD   Chief Complaint  Patient presents with  . other    12 month follow up. Patient states he is doing well. Meds reviewed verbally with patient.     HPI:  Richard Hardy is a 71 year old male with a history of  hyperlipidemia,  glucose intolerance, obstructive sleep apnea on CPAP,  Borderline diabetes,  Asymptomatic bradycardia strong family history of coronary artery disease  who returns for routine followup of his hyperlipidemia. 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    overall he states that he has been doing well.  Continues to work for [employer],  Lots of activity at work No symptoms of chest pain or shortness of breath with exertion.  Hemoglobin A1c 6.8, starting to restrict his diet  weight is down 8 pounds from previous office visit Total chol 110 LDL 61  EKG on today's visit shows sinus rhythm with rate 49 bpm, no significant ST or T-wave changes Heart rate typically runs in the 50s  Other past medical history seen by Dr. Grayland Hardy of hematology oncology for prior drop in his platelets. Aspirin was held through this.    Myoview in 2009 which showed an EF of 58% with no evidence of ischemia or infarct.  Previous ABIs which were normal at 1.3 on the right and 1.2 on the left.  PMH:   has a past medical history of Diabetes mellitus without complication ([place]) (1660); Glucose intolerance (impaired glucose tolerance); Hyperlipidemia; Obesity; Sleep apnea; and Thrombocythemia ([place]).  PSH:    Past Surgical History:  Procedure Laterality Date  . CATARACT EXTRACTION, BILATERAL    . LITHOTRIPSY     Dr. Bernardo Hardy  . NASAL SEPTUM SURGERY      Current Outpatient Prescriptions  Medication Sig Dispense Refill  . amLODipine (NORVASC) 5 MG tablet Take  1 tablet (5 mg total) by mouth daily. 90 tablet 3  . aspirin EC 81 MG tablet Take 1 tablet (81 mg total) by mouth daily. 90 tablet 4  . CINNAMON PO Take 2 capsules by mouth daily.    . Coenzyme Q10 (COQ10) 200 MG CAPS Take 1 tablet by mouth daily.      . Fish Oil OIL Take by mouth daily.      . fluticasone (FLONASE) 50 MCG/ACT nasal spray Place into the nose at bedtime.      Marland Kitchen GLUCOSAMINE SULFATE PO Take by mouth.    . Glucosamine-Chondroit-Vit C-Mn (GLUCOSAMINE-CHONDROITIN) CAPS Take 2 capsules by mouth daily.      Marland Kitchen glucose blood (BAYER CONTOUR TEST) test strip Check blood sugar 1-2 times daily . 100 each 12  . Misc Natural Products (OSTEO BI-FLEX TRIPLE STRENGTH PO) Take by mouth daily.    . Nutritional Supplements (NUTRITIONAL DRINK PO) Take 2 packets by mouth daily.    . saxagliptin HCl (ONGLYZA) 5 MG TABS tablet TAKE 1 BY MOUTH DAILY 90 tablet 1  . simvastatin (ZOCOR) 40 MG tablet TAKE 1 BY MOUTH DAILY AT 6PM 90 tablet 1  . vitamin C (ASCORBIC ACID) 500 MG tablet Take 500 mg by mouth daily.     No current facility-administered medications for this visit.      Allergies:   Lisinopril; Metformin and related; Sulfonamide derivatives; and Augmentin [amoxicillin-pot clavulanate]   Social History:  The patient  reports that he quit smoking about 53 years ago. He has a 2.00 pack-year smoking history. He has never used smokeless tobacco. He reports that he drinks alcohol. He reports that he does not use drugs.   Family History:   family history includes Cancer in his mother; Crohn's disease in his brother; Heart attack (age of onset: 47) in his father; Heart disease in his brother, father, and paternal grandfather; Hyperlipidemia in his son; Hypertension in his son; Sleep apnea in his son; Thyroid disease in his son.    Review of Systems: Review of Systems  Constitutional: Negative.   HENT: Negative.   Respiratory: Negative.   Cardiovascular: Negative.   Gastrointestinal: Negative.    Musculoskeletal: Negative.   Neurological: Negative.   Psychiatric/Behavioral: Negative.   All other systems reviewed and are negative.    PHYSICAL EXAM: VS:  BP 140/64 (BP Location: Left Arm, Patient Position: Sitting, Cuff Size: Normal)   Pulse (!) 49   Ht 5\' 10"  (1.778 m)   Wt 215 lb 4 oz (97.6 kg)   BMI 30.89 kg/m  , BMI Body mass index is 30.89 kg/m. GEN: Well nourished, well developed, in no acute distress HEENT: normal Neck: no JVD, carotid bruits, or masses Cardiac: RRR; no murmurs, rubs, or gallops,no edema  Respiratory:  clear to auscultation bilaterally, normal work of breathing GI: soft, nontender, nondistended, + BS MS: no deformity or atrophy Skin: warm and dry, no rash Neuro:  Strength and sensation are intact Psych: euthymic mood, full affect    Recent Labs: 05/21/2017: ALT 29; BUN 17; Creatinine, Ser 0.90; Potassium 5.2; Sodium 140    Lipid Panel Lab Results  Component Value Date   CHOL 110 05/21/2017   HDL 40.50 05/21/2017   LDLCALC 61 05/21/2017   TRIG 44.0 05/21/2017      Wt Readings from Last 3 Encounters:  06/19/17 215 lb 4 oz (97.6 kg)  05/21/17 214 lb 6.4 oz (97.3 kg)  04/28/17 223 lb 6.4 oz (101.3 kg)       ASSESSMENT AND PLAN:    Essential hypertension - Plan: EKG 12-Lead Blood pressure is well controlled on today's visit. No changes made to the medications.  Hyperlipemia Cholesterol is at goal on the current lipid regimen. No changes to the medications were made. Screening study discussed with him, CT coronary calcium score He will research this and call us if he would like to order the study  Controlled type 2 diabetes mellitus without complication, without long-term current use of insulin ([place]) - Plan: EKG 12-Lead We have encouraged continued exercise, careful diet management in an effort to lose weight. Diet discussed with him  Bradycardia -  Asymptomatic bradycardia   Total encounter time more than 25 minutes   Greater than 50% was spent in counseling and coordination of care with the patient   Disposition:   F/U  12 months   Orders Placed This Encounter  Procedures  . EKG 12-Lead     Signed, Esmond Plants, M.D., Ph.D. 06/19/2017  [place], Jette

## 2017-06-19 ENCOUNTER — Encounter: Payer: Self-pay | Admitting: Cardiovascular Disease

## 2017-06-19 ENCOUNTER — Ambulatory Visit (INDEPENDENT_AMBULATORY_CARE_PROVIDER_SITE_OTHER): Payer: Medicare Other | Admitting: Cardiovascular Disease

## 2017-06-19 VITALS — BP 140/64 | HR 49 | Ht 70.0 in | Wt 215.2 lb

## 2017-06-19 DIAGNOSIS — I10 Essential (primary) hypertension: Secondary | ICD-10-CM

## 2017-06-19 DIAGNOSIS — R809 Proteinuria, unspecified: Secondary | ICD-10-CM | POA: Diagnosis not present

## 2017-06-19 DIAGNOSIS — E1129 Type 2 diabetes mellitus with other diabetic kidney complication: Secondary | ICD-10-CM | POA: Diagnosis not present

## 2017-06-19 DIAGNOSIS — R001 Bradycardia, unspecified: Secondary | ICD-10-CM | POA: Diagnosis not present

## 2017-06-19 NOTE — Patient Instructions (Signed)
Medication Instructions:   No medication changes made  Labwork:  No new labs needed  Testing/Procedures:  Research CT coronary calcium score $150   Follow-Up: It was a pleasure seeing you in the office today. Please call us if you have new issues that need to be addressed before your next appt.  [phone]  Your physician wants you to follow-up in: 12 months.  You will receive a reminder letter in the mail two months in advance. If you don't receive a letter, please call our office to schedule the follow-up appointment.  If you need a refill on your cardiac medications before your next appointment, please call your pharmacy.

## 2017-06-20 ENCOUNTER — Encounter: Payer: Self-pay | Admitting: *Deleted

## 2017-06-23 ENCOUNTER — Ambulatory Visit
Admission: RE | Admit: 2017-06-23 | Discharge: 2017-06-23 | Disposition: A | Discharge status: Home / Self Care | Payer: Medicare Other | Source: Ambulatory Visit | Attending: Unknown Physician Specialty | Admitting: Unknown Physician Specialty

## 2017-06-23 ENCOUNTER — Ambulatory Visit: Payer: Medicare Other | Admitting: Anesthesiology

## 2017-06-23 ENCOUNTER — Encounter: Payer: Self-pay | Admitting: *Deleted

## 2017-06-23 ENCOUNTER — Encounter
Admission: RE | Disposition: A | Discharge status: Home / Self Care | Payer: Self-pay | Source: Ambulatory Visit | Attending: Unknown Physician Specialty

## 2017-06-23 DIAGNOSIS — Z7984 Long term (current) use of oral hypoglycemic drugs: Secondary | ICD-10-CM | POA: Diagnosis not present

## 2017-06-23 DIAGNOSIS — D473 Essential (hemorrhagic) thrombocythemia: Secondary | ICD-10-CM | POA: Insufficient documentation

## 2017-06-23 DIAGNOSIS — I10 Essential (primary) hypertension: Secondary | ICD-10-CM | POA: Insufficient documentation

## 2017-06-23 DIAGNOSIS — Z79899 Other long term (current) drug therapy: Secondary | ICD-10-CM | POA: Insufficient documentation

## 2017-06-23 DIAGNOSIS — Z86010 Personal history of colonic polyps: Secondary | ICD-10-CM | POA: Diagnosis not present

## 2017-06-23 DIAGNOSIS — Z7982 Long term (current) use of aspirin: Secondary | ICD-10-CM | POA: Diagnosis not present

## 2017-06-23 DIAGNOSIS — Z6830 Body mass index (BMI) 30.0-30.9, adult: Secondary | ICD-10-CM | POA: Diagnosis not present

## 2017-06-23 DIAGNOSIS — E119 Type 2 diabetes mellitus without complications: Secondary | ICD-10-CM | POA: Insufficient documentation

## 2017-06-23 DIAGNOSIS — Z87891 Personal history of nicotine dependence: Secondary | ICD-10-CM | POA: Insufficient documentation

## 2017-06-23 DIAGNOSIS — E785 Hyperlipidemia, unspecified: Secondary | ICD-10-CM | POA: Insufficient documentation

## 2017-06-23 DIAGNOSIS — Z882 Allergy status to sulfonamides status: Secondary | ICD-10-CM | POA: Diagnosis not present

## 2017-06-23 DIAGNOSIS — Z881 Allergy status to other antibiotic agents status: Secondary | ICD-10-CM | POA: Insufficient documentation

## 2017-06-23 DIAGNOSIS — D123 Benign neoplasm of transverse colon: Secondary | ICD-10-CM | POA: Insufficient documentation

## 2017-06-23 DIAGNOSIS — G4730 Sleep apnea, unspecified: Secondary | ICD-10-CM | POA: Insufficient documentation

## 2017-06-23 DIAGNOSIS — K635 Polyp of colon: Secondary | ICD-10-CM | POA: Diagnosis not present

## 2017-06-23 DIAGNOSIS — K640 First degree hemorrhoids: Secondary | ICD-10-CM | POA: Insufficient documentation

## 2017-06-23 DIAGNOSIS — Z1211 Encounter for screening for malignant neoplasm of colon: Secondary | ICD-10-CM | POA: Insufficient documentation

## 2017-06-23 DIAGNOSIS — Z888 Allergy status to other drugs, medicaments and biological substances status: Secondary | ICD-10-CM | POA: Diagnosis not present

## 2017-06-23 HISTORY — PX: COLONOSCOPY WITH PROPOFOL: SHX5780

## 2017-06-23 LAB — HM COLONOSCOPY

## 2017-06-23 LAB — GLUCOSE, CAPILLARY: Glucose-Capillary: 112 mg/dL — ABNORMAL HIGH (ref 65–99)

## 2017-06-23 SURGERY — COLONOSCOPY WITH PROPOFOL
Anesthesia: General

## 2017-06-23 MED ORDER — PROPOFOL 10 MG/ML IV BOLUS
INTRAVENOUS | Status: AC
  Filled 2017-06-23: qty 20

## 2017-06-23 MED ORDER — PROPOFOL 500 MG/50ML IV EMUL
INTRAVENOUS | Status: DC | PRN
  Administered 2017-06-23: 120 ug/kg/min via INTRAVENOUS

## 2017-06-23 MED ORDER — LIDOCAINE HCL (CARDIAC) 20 MG/ML IV SOLN
INTRAVENOUS | Status: DC | PRN
  Administered 2017-06-23: 5 mL via INTRAVENOUS

## 2017-06-23 MED ORDER — ATROPINE SULFATE 0.4 MG/ML IJ SOLN
INTRAMUSCULAR | Status: AC
  Filled 2017-06-23: qty 1

## 2017-06-23 MED ORDER — EPHEDRINE SULFATE 50 MG/ML IJ SOLN
INTRAMUSCULAR | Status: DC | PRN
  Administered 2017-06-23: 10 mg via INTRAVENOUS
  Administered 2017-06-23: 5 mg via INTRAVENOUS

## 2017-06-23 MED ORDER — LIDOCAINE HCL (PF) 2 % IJ SOLN
INTRAMUSCULAR | Status: AC
  Filled 2017-06-23: qty 2

## 2017-06-23 MED ORDER — SODIUM CHLORIDE 0.9 % IV SOLN: INTRAVENOUS | Status: DC

## 2017-06-23 MED ORDER — SODIUM CHLORIDE 0.9 % IV SOLN
INTRAVENOUS | Status: DC
  Administered 2017-06-23: 1000 mL via INTRAVENOUS

## 2017-06-23 MED ORDER — EPHEDRINE SULFATE 50 MG/ML IJ SOLN
INTRAMUSCULAR | Status: AC
  Filled 2017-06-23: qty 1

## 2017-06-23 MED ORDER — PHENYLEPHRINE HCL 10 MG/ML IJ SOLN
INTRAMUSCULAR | Status: DC | PRN
  Administered 2017-06-23: 100 ug via INTRAVENOUS

## 2017-06-23 MED ORDER — ATROPINE SULFATE 1 MG/ML IJ SOLN
INTRAMUSCULAR | Status: DC | PRN
  Administered 2017-06-23: 0.2 mg via INTRAVENOUS

## 2017-06-23 MED ORDER — GLYCOPYRROLATE 0.2 MG/ML IJ SOLN
INTRAMUSCULAR | Status: DC | PRN
  Administered 2017-06-23: 0.2 mg via INTRAVENOUS

## 2017-06-23 MED ORDER — PROPOFOL 10 MG/ML IV BOLUS
INTRAVENOUS | Status: DC | PRN
  Administered 2017-06-23: 10 mg via INTRAVENOUS
  Administered 2017-06-23: 30 mg via INTRAVENOUS

## 2017-06-23 MED ORDER — EPINEPHRINE PF 1 MG/ML IJ SOLN
INTRAMUSCULAR | Status: AC
  Filled 2017-06-23: qty 1

## 2017-06-23 MED ORDER — GLYCOPYRROLATE 0.2 MG/ML IJ SOLN
INTRAMUSCULAR | Status: AC
  Filled 2017-06-23: qty 1

## 2017-06-23 NOTE — Anesthesia Post-op Follow-up Note (Signed)
Anesthesia QCDR form completed.        

## 2017-06-23 NOTE — Anesthesia Postprocedure Evaluation (Signed)
Anesthesia Post Note  Patient: Richard Hardy  Procedure(s) Performed: Procedure(s) (LRB): COLONOSCOPY WITH PROPOFOL (N/A)  Patient location during evaluation: PACU Anesthesia Type: General Level of consciousness: awake Pain management: pain level controlled Vital Signs Assessment: post-procedure vital signs reviewed and stable Respiratory status: spontaneous breathing Cardiovascular status: stable Anesthetic complications: no     Last Vitals:  Vitals:   06/23/17 0941 06/23/17 0951  BP: 128/76 133/75  Pulse: (!) 55 (!) 51  Resp: 15 11  Temp:      Last Pain:  Vitals:   06/23/17 0921  TempSrc: Tympanic                 VAN STAVEREN,Stetson Pelaez

## 2017-06-23 NOTE — H&P (Signed)
Primary Care Physician:  Richard Mc, MD Primary Gastroenterologist:  Richard Hardy  Pre-Procedure History & Physical: HPI:  Richard Hardy is a 71 y.o. male is here for an colonoscopy.   Past Medical History:  Diagnosis Date  . Diabetes mellitus without complication ([place]) 5681  . Glucose intolerance (impaired glucose tolerance)   . Hyperlipidemia   . Obesity   . Sleep apnea    On BiPAP, Dr. Richardson Hardy  . Thrombocythemia ([place])    Dr. Grayland Hardy    Past Surgical History:  Procedure Laterality Date  . CATARACT EXTRACTION, BILATERAL    . EYE SURGERY    . LITHOTRIPSY     Dr. Bernardo Hardy  . NASAL SEPTUM SURGERY      Prior to Admission medications   Medication Sig Start Date End Date Taking? Authorizing Provider  amLODipine (NORVASC) 5 MG tablet Take 1 tablet (5 mg total) by mouth daily. 05/21/17  Yes Richard Mc, MD  CINNAMON PO Take 2 capsules by mouth daily.   Yes [provider]  Coenzyme Q10 (COQ10) 200 MG CAPS Take 1 tablet by mouth daily.     Yes [provider]  Fish Oil OIL Take by mouth daily.     Yes [provider]  fluticasone (FLONASE) 50 MCG/ACT nasal spray Place into the nose at bedtime.     Yes [provider]  GLUCOSAMINE SULFATE PO Take by mouth.   Yes [provider]  Glucosamine-Chondroit-Vit C-Mn (GLUCOSAMINE-CHONDROITIN) CAPS Take 2 capsules by mouth daily.     Yes [provider]  glucose blood (BAYER CONTOUR TEST) test strip Check blood sugar 1-2 times daily . 05/23/17  Yes Richard Mc, MD  Misc Natural Products (OSTEO BI-FLEX TRIPLE STRENGTH PO) Take by mouth daily.   Yes [provider]  Nutritional Supplements (NUTRITIONAL DRINK PO) Take 2 packets by mouth daily.   Yes [provider]  saxagliptin HCl (ONGLYZA) 5 MG TABS tablet TAKE 1 BY MOUTH DAILY 05/27/17  Yes Richard Mc, MD  simvastatin (ZOCOR) 40 MG tablet TAKE 1 BY MOUTH DAILY AT 6PM 03/12/17  Yes Richard Mc, MD   vitamin C (ASCORBIC ACID) 500 MG tablet Take 500 mg by mouth daily.   Yes [provider]  aspirin EC 81 MG tablet Take 1 tablet (81 mg total) by mouth daily. 11/20/16   Richard Mc, MD    Allergies as of 04/07/2017 - Review Complete 11/20/2016  Allergen Reaction Noted  . Lisinopril  02/18/2014  . Metformin and related  06/05/2012  . Sulfonamide derivatives    . Augmentin [amoxicillin-pot clavulanate] Rash 11/20/2016    Family History  Problem Relation Age of Onset  . Cancer Mother        brain  . Heart attack Father 52  . Heart disease Father   . Heart disease Brother        s/p CABG  . Crohn's disease Brother   . Hyperlipidemia Son   . Hypertension Son   . Sleep apnea Son   . Thyroid disease Son   . Heart disease Paternal Grandfather     Social History   Social History  . Marital status: Married    Spouse name: N/A  . Number of children: N/A  . Years of education: N/A   Occupational History  . Not on file.   Social History Main Topics  . Smoking status: Former Smoker    Packs/day: 0.50    Years: 4.00  Quit date: 12/10/1963  . Smokeless tobacco: Never Used  . Alcohol use Yes     Comment: Occasional  . Drug use: No  . Sexual activity: Not on file   Other Topics Concern  . Not on file   Social History Narrative   Lives in [place] with wife. 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 Neck:  Supple; no masses or thyromegaly. Lungs:  Clear throughout to auscultation.    Heart:  Regular rate and rhythm. Abdomen:   Soft, nontender and nondistended. Normal bowel sounds, without guarding, and without rebound.   Neurologic:  Alert and  oriented x4;  grossly normal neurologically.  Impression/Plan: Richard Hardy is here for an colonoscopy to be performed for [facility] colon polyps  Risks, benefits, limitations, and alternatives regarding  colonoscopy have been reviewed with the patient.  Questions have been answered.  All parties agreeable.   Richard Cheers, MD  06/23/2017, 8:31 AM

## 2017-06-23 NOTE — Anesthesia Preprocedure Evaluation (Signed)
Anesthesia Evaluation  Patient identified by MRN, date of birth, ID band Patient awake    Reviewed: Allergy & Precautions, NPO status , Patient's Chart, lab work & pertinent test results  Airway Mallampati: II       Dental  (+) Teeth Intact   Pulmonary sleep apnea and Continuous Positive Airway Pressure Ventilation , former smoker,    breath sounds clear to auscultation       Cardiovascular Exercise Tolerance: Good hypertension, Pt. on medications  Rhythm:Regular Rate:Bradycardia     Neuro/Psych negative neurological ROS  negative psych ROS   GI/Hepatic negative GI ROS, Neg liver ROS,   Endo/Other  diabetes, Type 2, Oral Hypoglycemic AgentsMorbid obesity  Renal/GU negative Renal ROS     Musculoskeletal negative musculoskeletal ROS (+)   Abdominal (+) + obese,   Peds negative pediatric ROS (+)  Hematology   Anesthesia Other Findings   Reproductive/Obstetrics negative OB ROS                             Anesthesia Physical Anesthesia Plan  ASA: III  Anesthesia Plan: General   Post-op Pain Management:    Induction: Intravenous  PONV Risk Score and Plan: 0  Airway Management Planned: Natural Airway and Nasal Cannula  Additional Equipment:   Intra-op Plan:   Post-operative Plan:   Informed Consent: I have reviewed the patients History and Physical, chart, labs and discussed the procedure including the risks, benefits and alternatives for the proposed anesthesia with the patient or authorized representative who has indicated his/her understanding and acceptance.     Plan Discussed with: Surgeon  Anesthesia Plan Comments:         Anesthesia Quick Evaluation

## 2017-06-23 NOTE — Transfer of Care (Signed)
Immediate Anesthesia Transfer of Care Note  Patient: Richard Hardy  Procedure(s) Performed: Procedure(s): COLONOSCOPY WITH PROPOFOL (N/A)  Patient Location: PACU  Anesthesia Type:General  Level of Consciousness: awake  Airway & Oxygen Therapy: Patient Spontanous Breathing and Patient connected to nasal cannula oxygen  Post-op Assessment: Report given to RN and Post -op Vital signs reviewed and stable  Post vital signs: Reviewed  Last Vitals:  Vitals:   06/23/17 0732  BP: 135/78  Pulse: (!) 50  Resp: 18  Temp: (!) 36.1 C    Last Pain:  Vitals:   06/23/17 0732  TempSrc: Tympanic         Complications: No apparent anesthesia complications

## 2017-06-23 NOTE — Op Note (Signed)
[facility] Gastroenterology Patient Name: Richard Hardy Procedure Date: 06/23/2017 8:38 AM MRN: [id number] Account #: [id number] Date of Birth: 1946/02/25 Admit Type: Outpatient Age: 71 Room: [facility] ENDO ROOM 3 Gender: Male Note Status: Finalized Procedure:            Colonoscopy Indications:          High risk colon cancer surveillance: Personal history                        of colonic polyps Providers:            Manya Silvas, MD Referring MD:         Deborra Medina, MD (Referring MD) Medicines:            Propofol per Anesthesia Complications:        No immediate complications. Procedure:            Pre-Anesthesia Assessment:                       - After reviewing the risks and benefits, the patient                        was deemed in satisfactory condition to undergo the                        procedure.                       After obtaining informed consent, the colonoscope was                        passed under direct vision. Throughout the procedure,                        the patient's blood pressure, pulse, and oxygen                        saturations were monitored continuously. The                        Colonoscope was introduced through the anus and                        advanced to the the cecum, identified by appendiceal                        orifice and ileocecal valve. The colonoscopy was                        somewhat difficult due to a tortuous colon. Successful                        completion of the procedure was aided by applying                        abdominal pressure. The patient tolerated the procedure                        well. The quality of the bowel preparation was good. Findings:      Two sessile polyps were found in the transverse colon. The polyps  were       small in size. These polyps were removed with a hot snare. Resection and       retrieval were complete.      A diminutive polyp was found in the hepatic flexure.  The polyp was       sessile. The polyp was removed with a jumbo cold forceps. Resection and       retrieval were complete.      A diminutive polyp was found in the hepatic flexure. The polyp was       sessile. The polyp was removed with a jumbo cold forceps. Resection and       retrieval were complete.      Internal hemorrhoids were found during endoscopy. The hemorrhoids were       small and Grade I (internal hemorrhoids that do not prolapse).      The exam was otherwise without abnormality. Impression:           - Two small polyps in the transverse colon, removed                        with a hot snare. Resected and retrieved.                       - One diminutive polyp at the hepatic flexure, removed                        with a jumbo cold forceps. Resected and retrieved.                       - One diminutive polyp at the hepatic flexure, removed                        with a jumbo cold forceps. Resected and retrieved.                       - Internal hemorrhoids.                       - The examination was otherwise normal. Recommendation:       - Await pathology results. Manya Silvas, MD 06/23/2017 9:18:09 AM This report has been signed electronically. Number of Addenda: 0 Note Initiated On: 06/23/2017 8:38 AM Scope Withdrawal Time: 0 hours 7 minutes 5 seconds  Total Procedure Duration: 0 hours 31 minutes 21 seconds       [facility]

## 2017-06-24 ENCOUNTER — Encounter: Payer: Self-pay | Admitting: Unknown Physician Specialty

## 2017-06-24 LAB — SURGICAL PATHOLOGY

## 2017-07-10 DIAGNOSIS — M9901 Segmental and somatic dysfunction of cervical region: Secondary | ICD-10-CM | POA: Diagnosis not present

## 2017-07-10 DIAGNOSIS — M955 Acquired deformity of pelvis: Secondary | ICD-10-CM | POA: Diagnosis not present

## 2017-07-10 DIAGNOSIS — M9905 Segmental and somatic dysfunction of pelvic region: Secondary | ICD-10-CM | POA: Diagnosis not present

## 2017-07-10 DIAGNOSIS — M531 Cervicobrachial syndrome: Secondary | ICD-10-CM | POA: Diagnosis not present

## 2017-07-10 DIAGNOSIS — M9903 Segmental and somatic dysfunction of lumbar region: Secondary | ICD-10-CM | POA: Diagnosis not present

## 2017-07-10 DIAGNOSIS — M5136 Other intervertebral disc degeneration, lumbar region: Secondary | ICD-10-CM | POA: Diagnosis not present

## 2017-07-28 ENCOUNTER — Encounter: Payer: Self-pay | Admitting: Internal Medicine

## 2017-08-21 DIAGNOSIS — M531 Cervicobrachial syndrome: Secondary | ICD-10-CM | POA: Diagnosis not present

## 2017-08-21 DIAGNOSIS — M9901 Segmental and somatic dysfunction of cervical region: Secondary | ICD-10-CM | POA: Diagnosis not present

## 2017-08-21 DIAGNOSIS — M5136 Other intervertebral disc degeneration, lumbar region: Secondary | ICD-10-CM | POA: Diagnosis not present

## 2017-08-21 DIAGNOSIS — M9903 Segmental and somatic dysfunction of lumbar region: Secondary | ICD-10-CM | POA: Diagnosis not present

## 2017-08-21 DIAGNOSIS — M9905 Segmental and somatic dysfunction of pelvic region: Secondary | ICD-10-CM | POA: Diagnosis not present

## 2017-08-21 DIAGNOSIS — M955 Acquired deformity of pelvis: Secondary | ICD-10-CM | POA: Diagnosis not present

## 2017-09-03 ENCOUNTER — Other Ambulatory Visit: Payer: Self-pay

## 2017-09-03 MED ORDER — SIMVASTATIN 40 MG PO TABS: ORAL_TABLET | ORAL | 1 refills | Status: DC

## 2017-09-08 ENCOUNTER — Encounter: Payer: Self-pay | Admitting: Internal Medicine

## 2017-09-18 DIAGNOSIS — M5136 Other intervertebral disc degeneration, lumbar region: Secondary | ICD-10-CM | POA: Diagnosis not present

## 2017-09-18 DIAGNOSIS — M9903 Segmental and somatic dysfunction of lumbar region: Secondary | ICD-10-CM | POA: Diagnosis not present

## 2017-09-18 DIAGNOSIS — M955 Acquired deformity of pelvis: Secondary | ICD-10-CM | POA: Diagnosis not present

## 2017-09-18 DIAGNOSIS — M9901 Segmental and somatic dysfunction of cervical region: Secondary | ICD-10-CM | POA: Diagnosis not present

## 2017-09-18 DIAGNOSIS — M9905 Segmental and somatic dysfunction of pelvic region: Secondary | ICD-10-CM | POA: Diagnosis not present

## 2017-09-18 DIAGNOSIS — M531 Cervicobrachial syndrome: Secondary | ICD-10-CM | POA: Diagnosis not present

## 2017-09-29 ENCOUNTER — Encounter: Payer: Self-pay | Admitting: Internal Medicine

## 2017-09-29 ENCOUNTER — Ambulatory Visit (INDEPENDENT_AMBULATORY_CARE_PROVIDER_SITE_OTHER): Payer: Medicare Other | Admitting: Internal Medicine

## 2017-09-29 VITALS — BP 130/82 | HR 60 | Temp 98.1°F | Resp 15 | Ht 70.0 in | Wt 216.4 lb

## 2017-09-29 DIAGNOSIS — Z23 Encounter for immunization: Secondary | ICD-10-CM

## 2017-09-29 DIAGNOSIS — R809 Proteinuria, unspecified: Secondary | ICD-10-CM | POA: Diagnosis not present

## 2017-09-29 DIAGNOSIS — E785 Hyperlipidemia, unspecified: Secondary | ICD-10-CM

## 2017-09-29 DIAGNOSIS — E1129 Type 2 diabetes mellitus with other diabetic kidney complication: Secondary | ICD-10-CM

## 2017-09-29 DIAGNOSIS — H8110 Benign paroxysmal vertigo, unspecified ear: Secondary | ICD-10-CM | POA: Diagnosis not present

## 2017-09-29 DIAGNOSIS — I10 Essential (primary) hypertension: Secondary | ICD-10-CM | POA: Diagnosis not present

## 2017-09-29 DIAGNOSIS — E875 Hyperkalemia: Secondary | ICD-10-CM | POA: Diagnosis not present

## 2017-09-29 DIAGNOSIS — E1169 Type 2 diabetes mellitus with other specified complication: Secondary | ICD-10-CM | POA: Diagnosis not present

## 2017-09-29 MED ORDER — HYDROCHLOROTHIAZIDE 25 MG PO TABS: 25.0000 mg | ORAL_TABLET | Freq: Every day | ORAL | 3 refills | Status: DC

## 2017-09-29 NOTE — Patient Instructions (Signed)
You have benign positional vertigo.   This is not due to amlodipine  I am adding hctz , for your blood pressure and the leg swelling.  Continue amlodipine   Goal bp is 130/80 in 2 weeks..  If not at goal by then let me know.   Benign Positional Vertigo Vertigo is the feeling that you or your surroundings are moving when they are not. Benign positional vertigo is the most common form of vertigo. The cause of this condition is not serious (is benign). This condition is triggered by certain movements and positions (is positional). This condition can be dangerous if it occurs while you are doing something that could endanger you or others, such as driving. What are the causes? In many cases, the cause of this condition is not known. It may be caused by a disturbance in an area of the inner ear that helps your brain to sense movement and balance. This disturbance can be caused by a viral infection (labyrinthitis), head injury, or repetitive motion. What increases the risk? This condition is more likely to develop in:  Women.  People who are 37 years of age or older.  What are the signs or symptoms? Symptoms of this condition usually happen when you move your head or your eyes in different directions. Symptoms may start suddenly, and they usually last for less than a minute. Symptoms may include:  Loss of balance and falling.  Feeling like you are spinning or moving.  Feeling like your surroundings are spinning or moving.  Nausea and vomiting.  Blurred vision.  Dizziness.  Involuntary eye movement (nystagmus).  Symptoms can be mild and cause only slight annoyance, or they can be severe and interfere with daily life. Episodes of benign positional vertigo may return (recur) over time, and they may be triggered by certain movements. Symptoms may improve over time. How is this diagnosed? This condition is usually diagnosed by medical history and a physical exam of the head, neck, and ears.  You may be referred to a health care provider who specializes in ear, nose, and throat (ENT) problems (otolaryngologist) or a provider who specializes in disorders of the nervous system (neurologist). You may have additional testing, including:  MRI.  A CT scan.  Eye movement tests. Your health care provider may ask you to change positions quickly while he or she watches you for symptoms of benign positional vertigo, such as nystagmus. Eye movement may be tested with an electronystagmogram (ENG), caloric stimulation, the Dix-Hallpike test, or the roll test.  An electroencephalogram (EEG). This records electrical activity in your brain.  Hearing tests.  How is this treated? Usually, your health care provider will treat this by moving your head in specific positions to adjust your inner ear back to normal. Surgery may be needed in severe cases, but this is rare. In some cases, benign positional vertigo may resolve on its own in 2-4 weeks. Follow these instructions at home: Safety  Move slowly.Avoid sudden body or head movements.  Avoid driving.  Avoid operating heavy machinery.  Avoid doing any tasks that would be dangerous to you or others if a vertigo episode would occur.  If you have trouble walking or keeping your balance, try using a cane for stability. If you feel dizzy or unstable, sit down right away.  Return to your normal activities as told by your health care provider. Ask your health care provider what activities are safe for you. General instructions  Take over-the-counter and prescription medicines only  as told by your health care provider.  Avoid certain positions or movements as told by your health care provider.  Drink enough fluid to keep your urine clear or pale yellow.  Keep all follow-up visits as told by your health care provider. This is important. Contact a health care provider if:  You have a fever.  Your condition gets worse or you develop new  symptoms.  Your family or friends notice any behavioral changes.  Your nausea or vomiting gets worse.  You have numbness or a "pins and needles" sensation. Get help right away if:  You have difficulty speaking or moving.  You are always dizzy.  You faint.  You develop severe headaches.  You have weakness in your legs or arms.  You have changes in your hearing or vision.  You develop a stiff neck.  You develop sensitivity to light. This information is not intended to replace advice given to you by your health care provider. Make sure you discuss any questions you have with your health care provider. Document Released: 09/02/2006 Document Revised: 05/02/2016 Document Reviewed: 03/20/2015 Elsevier Interactive Patient Education  [employer].

## 2017-09-29 NOTE — Progress Notes (Signed)
Subjective:  Patient ID: Richard Hardy, male    DOB: 11-04-1946  Age: 71 y.o. MRN: [id number]  CC: The primary encounter diagnosis was Controlled type 2 diabetes mellitus with microalbuminuria, without long-term current use of insulin ([place]). Diagnoses of Essential hypertension, Hyperlipidemia associated with type 2 diabetes mellitus ([place]), Encounter for immunization, Hyperkalemia, and Benign paroxysmal positional vertigo, unspecified laterality were also pertinent to this visit.  HPI Richard Hardy presents for follow up on type 2 DM , hypertension   In June, his regimen was changed  to amlodipine due to persistent hyperkalemia on losartan .  Home bps have not been well controlled and ankle edema has been noted.   New complaint of recurrent dizziness with mild vertigo  occurring with sudden position change from  supine position,  Improves when upright. Happened many years ago after blunt head trauma. Symptoms lasts for < 3 seconds and has not resulted in a fall   Wife had a hip replacement last Thursday,    Has been more active and he notes that his sugars improve when active. . Had a high of 142 FASTING last week. Afternoon readings Readings have been < 140.      Lab Results  Component Value Date   NA 142 09/29/2017   K 4.5 09/29/2017   CL 105 09/29/2017   CO2 31 09/29/2017   Lab Results  Component Value Date   MICROALBUR 2.0 (H) 05/21/2017      Outpatient Medications Prior to Visit  Medication Sig Dispense Refill  . amLODipine (NORVASC) 5 MG tablet Take 1 tablet (5 mg total) by mouth daily. 90 tablet 3  . aspirin EC 81 MG tablet Take 1 tablet (81 mg total) by mouth daily. 90 tablet 4  . CINNAMON PO Take 2 capsules by mouth daily.    . Coenzyme Q10 (COQ10) 200 MG CAPS Take 1 tablet by mouth daily.      . Fish Oil OIL Take by mouth daily.      . fluticasone (FLONASE) 50 MCG/ACT nasal spray Place into the nose at bedtime.      Marland Kitchen GLUCOSAMINE SULFATE PO Take by mouth.    .  Glucosamine-Chondroit-Vit C-Mn (GLUCOSAMINE-CHONDROITIN) CAPS Take 2 capsules by mouth daily.      Marland Kitchen glucose blood (BAYER CONTOUR TEST) test strip Check blood sugar 1-2 times daily . 100 each 12  . Misc Natural Products (OSTEO BI-FLEX TRIPLE STRENGTH PO) Take by mouth daily.    . Nutritional Supplements (NUTRITIONAL DRINK PO) Take 2 packets by mouth daily.    . saxagliptin HCl (ONGLYZA) 5 MG TABS tablet TAKE 1 BY MOUTH DAILY 90 tablet 1  . simvastatin (ZOCOR) 40 MG tablet TAKE 1 BY MOUTH DAILY AT 6PM 90 tablet 1  . vitamin C (ASCORBIC ACID) 500 MG tablet Take 500 mg by mouth daily.     No facility-administered medications prior to visit.     Review of Systems;  Patient denies headache, fevers, malaise, unintentional weight loss, skin rash, eye pain, sinus congestion and sinus pain, sore throat, dysphagia,  hemoptysis , cough, dyspnea, wheezing, chest pain, palpitations, orthopnea, edema, abdominal pain, nausea, melena, diarrhea, constipation, flank pain, dysuria, hematuria, urinary  Frequency, nocturia, numbness, tingling, seizures,  Focal weakness, Loss of consciousness,  Tremor, insomnia, depression, anxiety, and suicidal ideation.      Objective:  BP 130/82 (BP Location: Left Arm, Patient Position: Sitting, Cuff Size: Normal)   Pulse 60   Temp 98.1 F (36.7 C) (Oral)  Resp 15   Ht 5\' 10"  (1.778 m)   Wt 216 lb 6.4 oz (98.2 kg)   SpO2 93%   BMI 31.05 kg/m   BP Readings from Last 3 Encounters:  09/29/17 130/82  06/23/17 (!) 145/82  06/19/17 140/64    Wt Readings from Last 3 Encounters:  09/29/17 216 lb 6.4 oz (98.2 kg)  06/23/17 215 lb (97.5 kg)  06/19/17 215 lb 4 oz (97.6 kg)    General appearance: alert, cooperative and appears stated age Ears: normal TM's and external ear canals both ears Throat: lips, mucosa, and tongue normal; teeth and gums normal Neck: no adenopathy, no carotid bruit, supple, symmetrical, trachea midline and thyroid not enlarged, symmetric, no  tenderness/mass/nodules Back: symmetric, no curvature. ROM normal. No CVA tenderness. Lungs: clear to auscultation bilaterally Heart: regular rate and rhythm, S1, S2 normal, no murmur, click, rub or gallop Abdomen: soft, non-tender; bowel sounds normal; no masses,  no organomegaly Pulses: 2+ and symmetric Skin: Skin color, texture, turgor normal. No rashes or lesions Lymph nodes: Cervical, supraclavicular, and axillary nodes normal. Neuro:  awake and interactive with normal mood and affect. Higher cortical functions are normal. Speech is clear without word-finding difficulty or dysarthria. Extraocular movements are intact. Visual fields of both eyes are grossly intact. Sensation to light touch is grossly intact bilaterally of upper and lower extremities. Motor examination shows 4+/5 symmetric hand grip and upper extremity and 5/5 lower extremity strength. There is no pronation or drift. Gait is non-ataxic    Lab Results  Component Value Date   HGBA1C 6.6 (H) 09/29/2017   HGBA1C 6.8 (H) 05/21/2017   HGBA1C 6.7 (H) 11/20/2016    Lab Results  Component Value Date   CREATININE 0.93 09/29/2017   CREATININE 0.90 05/21/2017   CREATININE 0.99 12/13/2016    Lab Results  Component Value Date   WBC 5.7 08/29/2015   HGB 15.6 08/29/2015   HCT 45.3 08/29/2015   PLT 167.0 08/29/2015   GLUCOSE 101 (H) 09/29/2017   CHOL 110 05/21/2017   TRIG 44.0 05/21/2017   HDL 40.50 05/21/2017   LDLDIRECT 60.0 09/29/2017   LDLCALC 61 05/21/2017   ALT 29 09/29/2017   AST 25 09/29/2017   NA 142 09/29/2017   K 4.5 09/29/2017   CL 105 09/29/2017   CREATININE 0.93 09/29/2017   BUN 15 09/29/2017   CO2 31 09/29/2017   TSH 2.45 08/12/2007   PSA 0.27 08/29/2015   HGBA1C 6.6 (H) 09/29/2017   MICROALBUR 2.0 (H) 05/21/2017    No results found.  Assessment & Plan:   Problem List Items Addressed This Visit    Benign positional vertigo    By exam/  Symptoms very fleeting.  Not orthostatic        Controlled diabetes mellitus with microalbuminuria ([place]) - Primary    Well controlled on onglyza.  He has microalbuminuria and did not tolerate ace inhibitor,  And has had repeated elevations in his  potassium on losartan .   Lab Results  Component Value Date   HGBA1C 6.6 (H) 09/29/2017   Lab Results  Component Value Date   MICROALBUR 2.0 (H) 05/21/2017         Relevant Orders   Hemoglobin A1c (Completed)   Hyperkalemia    Resolved with dc/ losartan      Hyperlipidemia associated with type 2 diabetes mellitus ([facility])   Relevant Medications   hydrochlorothiazide (HYDRODIURIL) 25 MG tablet   Other Relevant Orders   LDL cholesterol, direct (Completed)  Hypertension    Adding hctz to amlodipine for improved control and management of fluid retention.  Lab Results  Component Value Date   CREATININE 0.93 09/29/2017   Lab Results  Component Value Date   NA 142 09/29/2017   K 4.5 09/29/2017   CL 105 09/29/2017   CO2 31 09/29/2017         Relevant Medications   hydrochlorothiazide (HYDRODIURIL) 25 MG tablet   Other Relevant Orders   Comprehensive metabolic panel (Completed)    Other Visit Diagnoses    Encounter for immunization       Relevant Orders   Flu vaccine HIGH DOSE PF (Completed)      I am having Mr. Lafoe start on hydrochlorothiazide. I am also having him maintain his CoQ10, Fish Oil, fluticasone, Glucosamine-Chondroitin, Misc Natural Products (OSTEO BI-FLEX TRIPLE STRENGTH PO), vitamin C, aspirin EC, Nutritional Supplements (NUTRITIONAL DRINK PO), CINNAMON PO, GLUCOSAMINE SULFATE PO, amLODipine, glucose blood, saxagliptin HCl, and simvastatin.  Meds ordered this encounter  Medications  . hydrochlorothiazide (HYDRODIURIL) 25 MG tablet    Sig: Take 1 tablet (25 mg total) by mouth daily.    Dispense:  90 tablet    Refill:  3    There are no discontinued medications.  Follow-up: Return in about 6 months (around 03/30/2018).   Crecencio Mc, MD

## 2017-09-30 LAB — COMPREHENSIVE METABOLIC PANEL
ALBUMIN: 4.4 g/dL (ref 3.5–5.2)
ALT: 29 U/L (ref 0–53)
AST: 25 U/L (ref 0–37)
Alkaline Phosphatase: 55 U/L (ref 39–117)
BUN: 15 mg/dL (ref 6–23)
CALCIUM: 9.8 mg/dL (ref 8.4–10.5)
CHLORIDE: 105 meq/L (ref 96–112)
CO2: 31 mEq/L (ref 19–32)
CREATININE: 0.93 mg/dL (ref 0.40–1.50)
GFR: 84.49 mL/min (ref >60.00)
Glucose, Bld: 101 mg/dL — ABNORMAL HIGH (ref 70–99)
POTASSIUM: 4.5 meq/L (ref 3.5–5.1)
Sodium: 142 mEq/L (ref 135–145)
Total Bilirubin: 0.6 mg/dL (ref 0.2–1.2)
Total Protein: 7.1 g/dL (ref 6.0–8.3)

## 2017-09-30 LAB — HEMOGLOBIN A1C: Hgb A1c MFr Bld: 6.6 % — ABNORMAL HIGH (ref 4.6–6.5)

## 2017-09-30 LAB — LDL CHOLESTEROL, DIRECT: LDL DIRECT: 60 mg/dL

## 2017-10-02 DIAGNOSIS — H8110 Benign paroxysmal vertigo, unspecified ear: Secondary | ICD-10-CM | POA: Insufficient documentation

## 2017-10-02 NOTE — Assessment & Plan Note (Signed)
Adding hctz to amlodipine for improved control and management of fluid retention.  Lab Results  Component Value Date   CREATININE 0.93 09/29/2017   Lab Results  Component Value Date   NA 142 09/29/2017   K 4.5 09/29/2017   CL 105 09/29/2017   CO2 31 09/29/2017

## 2017-10-02 NOTE — Assessment & Plan Note (Signed)
Resolved with dc/ losartan

## 2017-10-02 NOTE — Assessment & Plan Note (Signed)
Well controlled on onglyza.  He has microalbuminuria and did not tolerate ace inhibitor,  And has had repeated elevations in his  potassium on losartan .   Lab Results  Component Value Date   HGBA1C 6.6 (H) 09/29/2017   Lab Results  Component Value Date   MICROALBUR 2.0 (H) 05/21/2017

## 2017-10-02 NOTE — Assessment & Plan Note (Signed)
By exam/  Symptoms very fleeting.  Not orthostatic

## 2017-10-16 DIAGNOSIS — M531 Cervicobrachial syndrome: Secondary | ICD-10-CM | POA: Diagnosis not present

## 2017-10-16 DIAGNOSIS — M9905 Segmental and somatic dysfunction of pelvic region: Secondary | ICD-10-CM | POA: Diagnosis not present

## 2017-10-16 DIAGNOSIS — M955 Acquired deformity of pelvis: Secondary | ICD-10-CM | POA: Diagnosis not present

## 2017-10-16 DIAGNOSIS — M5136 Other intervertebral disc degeneration, lumbar region: Secondary | ICD-10-CM | POA: Diagnosis not present

## 2017-10-16 DIAGNOSIS — M9901 Segmental and somatic dysfunction of cervical region: Secondary | ICD-10-CM | POA: Diagnosis not present

## 2017-10-16 DIAGNOSIS — M9903 Segmental and somatic dysfunction of lumbar region: Secondary | ICD-10-CM | POA: Diagnosis not present

## 2017-11-06 DIAGNOSIS — D179 Benign lipomatous neoplasm, unspecified: Secondary | ICD-10-CM | POA: Diagnosis not present

## 2017-11-06 DIAGNOSIS — Z1283 Encounter for screening for malignant neoplasm of skin: Secondary | ICD-10-CM | POA: Diagnosis not present

## 2017-11-06 DIAGNOSIS — L570 Actinic keratosis: Secondary | ICD-10-CM | POA: Diagnosis not present

## 2017-11-06 DIAGNOSIS — L578 Other skin changes due to chronic exposure to nonionizing radiation: Secondary | ICD-10-CM | POA: Diagnosis not present

## 2017-11-06 DIAGNOSIS — D2361 Other benign neoplasm of skin of right upper limb, including shoulder: Secondary | ICD-10-CM | POA: Diagnosis not present

## 2017-11-06 DIAGNOSIS — L812 Freckles: Secondary | ICD-10-CM | POA: Diagnosis not present

## 2017-11-06 DIAGNOSIS — L821 Other seborrheic keratosis: Secondary | ICD-10-CM | POA: Diagnosis not present

## 2017-11-06 DIAGNOSIS — D229 Melanocytic nevi, unspecified: Secondary | ICD-10-CM | POA: Diagnosis not present

## 2017-11-07 ENCOUNTER — Encounter: Payer: Self-pay | Admitting: Internal Medicine

## 2017-11-13 DIAGNOSIS — M9903 Segmental and somatic dysfunction of lumbar region: Secondary | ICD-10-CM | POA: Diagnosis not present

## 2017-11-13 DIAGNOSIS — M5136 Other intervertebral disc degeneration, lumbar region: Secondary | ICD-10-CM | POA: Diagnosis not present

## 2017-11-13 DIAGNOSIS — M531 Cervicobrachial syndrome: Secondary | ICD-10-CM | POA: Diagnosis not present

## 2017-11-13 DIAGNOSIS — M9901 Segmental and somatic dysfunction of cervical region: Secondary | ICD-10-CM | POA: Diagnosis not present

## 2017-11-13 DIAGNOSIS — M955 Acquired deformity of pelvis: Secondary | ICD-10-CM | POA: Diagnosis not present

## 2017-11-13 DIAGNOSIS — M9905 Segmental and somatic dysfunction of pelvic region: Secondary | ICD-10-CM | POA: Diagnosis not present

## 2017-11-26 ENCOUNTER — Encounter: Payer: Self-pay | Admitting: Internal Medicine

## 2017-11-27 MED ORDER — SAXAGLIPTIN HCL 5 MG PO TABS: ORAL_TABLET | ORAL | 1 refills | Status: DC

## 2017-12-18 DIAGNOSIS — M955 Acquired deformity of pelvis: Secondary | ICD-10-CM | POA: Diagnosis not present

## 2017-12-18 DIAGNOSIS — M531 Cervicobrachial syndrome: Secondary | ICD-10-CM | POA: Diagnosis not present

## 2017-12-18 DIAGNOSIS — M9903 Segmental and somatic dysfunction of lumbar region: Secondary | ICD-10-CM | POA: Diagnosis not present

## 2017-12-18 DIAGNOSIS — M9901 Segmental and somatic dysfunction of cervical region: Secondary | ICD-10-CM | POA: Diagnosis not present

## 2017-12-18 DIAGNOSIS — M9905 Segmental and somatic dysfunction of pelvic region: Secondary | ICD-10-CM | POA: Diagnosis not present

## 2017-12-18 DIAGNOSIS — M5136 Other intervertebral disc degeneration, lumbar region: Secondary | ICD-10-CM | POA: Diagnosis not present

## 2018-01-02 DIAGNOSIS — D3131 Benign neoplasm of right choroid: Secondary | ICD-10-CM | POA: Diagnosis not present

## 2018-01-02 LAB — HM DIABETES EYE EXAM

## 2018-01-08 ENCOUNTER — Encounter: Payer: Self-pay | Admitting: Internal Medicine

## 2018-01-29 DIAGNOSIS — M531 Cervicobrachial syndrome: Secondary | ICD-10-CM | POA: Diagnosis not present

## 2018-01-29 DIAGNOSIS — M9905 Segmental and somatic dysfunction of pelvic region: Secondary | ICD-10-CM | POA: Diagnosis not present

## 2018-01-29 DIAGNOSIS — M955 Acquired deformity of pelvis: Secondary | ICD-10-CM | POA: Diagnosis not present

## 2018-01-29 DIAGNOSIS — M5136 Other intervertebral disc degeneration, lumbar region: Secondary | ICD-10-CM | POA: Diagnosis not present

## 2018-01-29 DIAGNOSIS — M9901 Segmental and somatic dysfunction of cervical region: Secondary | ICD-10-CM | POA: Diagnosis not present

## 2018-01-29 DIAGNOSIS — M9903 Segmental and somatic dysfunction of lumbar region: Secondary | ICD-10-CM | POA: Diagnosis not present

## 2018-02-12 ENCOUNTER — Other Ambulatory Visit: Payer: Self-pay | Admitting: *Deleted

## 2018-02-12 MED ORDER — SIMVASTATIN 40 MG PO TABS: ORAL_TABLET | ORAL | 1 refills | Status: DC

## 2018-02-27 ENCOUNTER — Encounter: Payer: Self-pay | Admitting: Internal Medicine

## 2018-03-02 MED ORDER — SITAGLIPTIN PHOSPHATE 50 MG PO TABS: 50.0000 mg | ORAL_TABLET | Freq: Every day | ORAL | 2 refills | Status: DC

## 2018-03-12 ENCOUNTER — Telehealth: Payer: Self-pay

## 2018-03-12 DIAGNOSIS — M955 Acquired deformity of pelvis: Secondary | ICD-10-CM | POA: Diagnosis not present

## 2018-03-12 DIAGNOSIS — M9905 Segmental and somatic dysfunction of pelvic region: Secondary | ICD-10-CM | POA: Diagnosis not present

## 2018-03-12 DIAGNOSIS — M531 Cervicobrachial syndrome: Secondary | ICD-10-CM | POA: Diagnosis not present

## 2018-03-12 DIAGNOSIS — M9901 Segmental and somatic dysfunction of cervical region: Secondary | ICD-10-CM | POA: Diagnosis not present

## 2018-03-12 DIAGNOSIS — M9903 Segmental and somatic dysfunction of lumbar region: Secondary | ICD-10-CM | POA: Diagnosis not present

## 2018-03-12 DIAGNOSIS — M5136 Other intervertebral disc degeneration, lumbar region: Secondary | ICD-10-CM | POA: Diagnosis not present

## 2018-03-12 NOTE — Telephone Encounter (Signed)
PA for onglyza has been submitted on covermymeds.

## 2018-03-13 ENCOUNTER — Telehealth: Payer: Self-pay

## 2018-03-13 ENCOUNTER — Telehealth: Payer: Self-pay | Admitting: Internal Medicine

## 2018-03-13 MED ORDER — SITAGLIPTIN PHOSPHATE 50 MG PO TABS: 50.0000 mg | ORAL_TABLET | Freq: Every day | ORAL | 2 refills | Status: DC

## 2018-03-13 NOTE — Telephone Encounter (Signed)
FYI

## 2018-03-13 NOTE — Telephone Encounter (Signed)
Richard Hardy  Has been se sent

## 2018-03-13 NOTE — Telephone Encounter (Signed)
Please advise 

## 2018-03-13 NOTE — Addendum Note (Signed)
Addended by: Crecencio Mc on: 03/13/2018 01:07 PM   Modules accepted: Orders

## 2018-03-13 NOTE — Telephone Encounter (Signed)
I sent a CRM on [name] medication stating that onglyza has been denied, [employer] states that he would need to try Januvia and fail before they will cover onglyza. It looks like a prescription for Januvia was written on 03-02-18. Patient states that he does not Richard Hardy to be on the onglyza and will take the [place].

## 2018-03-13 NOTE — Telephone Encounter (Unsigned)
Copied from [place]. Topic: Quick Communication - Rx Refill/Question >> Mar 13, 2018 11:39 AM Neva Seat wrote: [facility] [phone] opt 5  Onglyza 5 mg has been denied.   Pt needs to try formulary alternative - Januvia

## 2018-03-13 NOTE — Telephone Encounter (Signed)
See previous phone encounter.

## 2018-03-13 NOTE — Telephone Encounter (Signed)
Richard Hardy was prescribed on march  25 , are you sure you have the right info?

## 2018-03-19 DIAGNOSIS — G4733 Obstructive sleep apnea (adult) (pediatric): Secondary | ICD-10-CM | POA: Diagnosis not present

## 2018-03-30 ENCOUNTER — Ambulatory Visit (INDEPENDENT_AMBULATORY_CARE_PROVIDER_SITE_OTHER): Payer: Medicare Other | Admitting: Internal Medicine

## 2018-03-30 ENCOUNTER — Encounter: Payer: Self-pay | Admitting: Internal Medicine

## 2018-03-30 VITALS — BP 130/82 | HR 49 | Temp 98.3°F | Resp 14 | Ht 70.0 in | Wt 216.6 lb

## 2018-03-30 DIAGNOSIS — Z125 Encounter for screening for malignant neoplasm of prostate: Secondary | ICD-10-CM

## 2018-03-30 DIAGNOSIS — I10 Essential (primary) hypertension: Secondary | ICD-10-CM | POA: Diagnosis not present

## 2018-03-30 DIAGNOSIS — E1169 Type 2 diabetes mellitus with other specified complication: Secondary | ICD-10-CM | POA: Diagnosis not present

## 2018-03-30 DIAGNOSIS — R809 Proteinuria, unspecified: Secondary | ICD-10-CM

## 2018-03-30 DIAGNOSIS — E785 Hyperlipidemia, unspecified: Secondary | ICD-10-CM

## 2018-03-30 DIAGNOSIS — I872 Venous insufficiency (chronic) (peripheral): Secondary | ICD-10-CM

## 2018-03-30 DIAGNOSIS — E1129 Type 2 diabetes mellitus with other diabetic kidney complication: Secondary | ICD-10-CM | POA: Diagnosis not present

## 2018-03-30 LAB — PSA, MEDICARE: PSA: 0.19 ng/mL (ref 0.10–4.00)

## 2018-03-30 LAB — LIPID PANEL
CHOLESTEROL: 115 mg/dL (ref 0–200)
HDL: 42.2 mg/dL (ref >39.00)
LDL Cholesterol: 60 mg/dL (ref 0–99)
NonHDL: 72.57
TRIGLYCERIDES: 64 mg/dL (ref 0.0–149.0)
Total CHOL/HDL Ratio: 3
VLDL: 12.8 mg/dL (ref 0.0–40.0)

## 2018-03-30 LAB — COMPREHENSIVE METABOLIC PANEL
ALBUMIN: 4.4 g/dL (ref 3.5–5.2)
ALK PHOS: 46 U/L (ref 39–117)
ALT: 25 U/L (ref 0–53)
AST: 20 U/L (ref 0–37)
BUN: 18 mg/dL (ref 6–23)
CALCIUM: 9.4 mg/dL (ref 8.4–10.5)
CO2: 28 mEq/L (ref 19–32)
Chloride: 106 mEq/L (ref 96–112)
Creatinine, Ser: 0.99 mg/dL (ref 0.40–1.50)
GFR: 78.5 mL/min (ref >60.00)
GLUCOSE: 150 mg/dL — AB (ref 70–99)
POTASSIUM: 4.7 meq/L (ref 3.5–5.1)
Sodium: 142 mEq/L (ref 135–145)
TOTAL PROTEIN: 6.5 g/dL (ref 6.0–8.3)
Total Bilirubin: 0.7 mg/dL (ref 0.2–1.2)

## 2018-03-30 LAB — MICROALBUMIN / CREATININE URINE RATIO
Creatinine,U: 117.4 mg/dL
Microalb Creat Ratio: 1.1 mg/g (ref 0.0–30.0)
Microalb, Ur: 1.3 mg/dL (ref 0.0–1.9)

## 2018-03-30 LAB — HEMOGLOBIN A1C: HEMOGLOBIN A1C: 7.9 % — AB (ref 4.6–6.5)

## 2018-03-30 NOTE — Assessment & Plan Note (Addendum)
Previously Well controlled on onglyza, now on Januvia dure to [place] formulary changes,  without side effects. .  He has microalbuminuria and did not tolerate ace inhibitor,  And has had repeated elevations in his  potassium on losartan . Discussed recent aspirin study and recommended that he continue daily asa and statin   Lab Results  Component Value Date   HGBA1C 6.6 (H) 09/29/2017   Lab Results  Component Value Date   MICROALBUR 2.0 (H) 05/21/2017

## 2018-03-30 NOTE — Assessment & Plan Note (Signed)
LDL and triglycerides have been historically at goal on current medications without side effects or  liver enzymes elevation.  Repeat today   Lab Results  Component Value Date   CHOL 110 05/21/2017   HDL 40.50 05/21/2017   LDLCALC 61 05/21/2017   LDLDIRECT 60.0 09/29/2017   TRIG 44.0 05/21/2017   CHOLHDL 3 05/21/2017   Lab Results  Component Value Date   ALT 29 09/29/2017   AST 25 09/29/2017   ALKPHOS 55 09/29/2017   BILITOT 0.6 09/29/2017

## 2018-03-30 NOTE — Progress Notes (Signed)
Subjective:  Patient ID: Richard Hardy, male    DOB: 19-Jun-1946  Age: 72 y.o. MRN: [id number]  CC: The primary encounter diagnosis was Prostate cancer screening. Diagnoses of Hyperlipidemia associated with type 2 diabetes mellitus (Richard Hardy), Essential hypertension, Controlled type 2 diabetes mellitus with microalbuminuria, without long-term current use of insulin (Richard Hardy), Venous insufficiency, Venous insufficiency of both lower extremities, and Screening for prostate cancer were also pertinent to this visit.  HPI Richard Hardy presents for 6 MONTH follow up on type 2 dm.  Hypertension,  And hyperlipidemia .   DM managed with januvia per formulary change   6 month follow up on diabetes.  Patient has no complaints today.  Patient is following a low glycemic index diet mOST OF THE TIME and taking all prescribed medications regularly without side effects.  Fasting sugars have been under less than 130 most of the time and post prandials have been under 160 .  Patient is exercising about 3 times per week and intentionally trying to lose weight .  Patient has had an eye exam in the last 12 months and checks feet regularly for signs of infection.  Patient does not walk barefoot outside,  And denies any numbness tingling or burning in feet. Patient is up to date on all recommended vaccinations      Outpatient Medications Prior to Visit  Medication Sig Dispense Refill  . amLODipine (NORVASC) 5 MG tablet Take 1 tablet (5 mg total) by mouth daily. 90 tablet 3  . aspirin EC 81 MG tablet Take 1 tablet (81 mg total) by mouth daily. 90 tablet 4  . CINNAMON PO Take 2 capsules by mouth daily.    . Coenzyme Q10 (COQ10) 200 MG CAPS Take 1 tablet by mouth daily.      . Fish Oil OIL Take by mouth daily.      . fluticasone (FLONASE) 50 MCG/ACT nasal spray Place into the nose at bedtime.      Marland Kitchen GLUCOSAMINE SULFATE PO Take by mouth.    . Glucosamine-Chondroit-Vit C-Mn (GLUCOSAMINE-CHONDROITIN) CAPS Take 2 capsules by  mouth daily.      Marland Kitchen glucose blood (BAYER CONTOUR TEST) test strip Check blood sugar 1-2 times daily . 100 each 12  . hydrochlorothiazide (HYDRODIURIL) 25 MG tablet Take 1 tablet (25 mg total) by mouth daily. 90 tablet 3  . Misc Natural Products (OSTEO BI-FLEX TRIPLE STRENGTH PO) Take by mouth daily.    . Nutritional Supplements (NUTRITIONAL DRINK PO) Take 2 packets by mouth daily.    . simvastatin (ZOCOR) 40 MG tablet TAKE 1 BY MOUTH DAILY AT 6PM 90 tablet 1  . sitaGLIPtin (JANUVIA) 50 MG tablet Take 1 tablet (50 mg total) by mouth daily. 30 tablet 2  . vitamin C (ASCORBIC ACID) 500 MG tablet Take 500 mg by mouth daily.    . saxagliptin HCl (ONGLYZA) 5 MG TABS tablet TAKE 1 BY MOUTH DAILY (Patient not taking: Reported on 03/30/2018) 90 tablet 1   No facility-administered medications prior to visit.     Review of Systems;  Patient denies headache, fevers, malaise, unintentional weight loss, skin rash, eye pain, sinus congestion and sinus pain, sore throat, dysphagia,  hemoptysis , cough, dyspnea, wheezing, chest pain, palpitations, orthopnea, edema, abdominal pain, nausea, melena, diarrhea, constipation, flank pain, dysuria, hematuria, urinary  Frequency, nocturia, numbness, tingling, seizures,  Focal weakness, Loss of consciousness,  Tremor, insomnia, depression, anxiety, and suicidal ideation.      Objective:  BP 130/82 (BP Location: Left  Arm, Patient Position: Sitting, Cuff Size: Normal)   Pulse (!) 49   Temp 98.3 F (36.8 C) (Oral)   Resp 14   Ht 5\' 10"  (1.778 m)   Wt 216 lb 9.6 oz (98.2 kg)   SpO2 94%   BMI 31.08 kg/m   BP Readings from Last 3 Encounters:  03/30/18 130/82  09/29/17 130/82  06/23/17 (!) 145/82    Wt Readings from Last 3 Encounters:  03/30/18 216 lb 9.6 oz (98.2 kg)  09/29/17 216 lb 6.4 oz (98.2 kg)  06/23/17 215 lb (97.5 kg)    General appearance: alert, cooperative and appears stated age Ears: normal TM's and external ear canals both ears Throat: lips,  mucosa, and tongue normal; teeth and gums normal Neck: no adenopathy, no carotid bruit, supple, symmetrical, trachea midline and thyroid not enlarged, symmetric, no tenderness/mass/nodules Back: symmetric, no curvature. ROM normal. No CVA tenderness. Lungs: clear to auscultation bilaterally Heart: regular rate and rhythm, S1, S2 normal, no murmur, click, rub or gallop Abdomen: soft, non-tender; bowel sounds normal; no masses,  no organomegaly Pulses: 2+ and symmetric Skin: Skin color, texture, turgor normal. No rashes or lesions Lymph nodes: Cervical, supraclavicular, and axillary nodes normal.  Lab Results  Component Value Date   HGBA1C 6.6 (H) 09/29/2017   HGBA1C 6.8 (H) 05/21/2017   HGBA1C 6.7 (H) 11/20/2016    Lab Results  Component Value Date   CREATININE 0.93 09/29/2017   CREATININE 0.90 05/21/2017   CREATININE 0.99 12/13/2016    Lab Results  Component Value Date   WBC 5.7 08/29/2015   HGB 15.6 08/29/2015   HCT 45.3 08/29/2015   PLT 167.0 08/29/2015   GLUCOSE 101 (H) 09/29/2017   CHOL 110 05/21/2017   TRIG 44.0 05/21/2017   HDL 40.50 05/21/2017   LDLDIRECT 60.0 09/29/2017   LDLCALC 61 05/21/2017   ALT 29 09/29/2017   AST 25 09/29/2017   NA 142 09/29/2017   K 4.5 09/29/2017   CL 105 09/29/2017   CREATININE 0.93 09/29/2017   BUN 15 09/29/2017   CO2 31 09/29/2017   TSH 2.45 08/12/2007   PSA 0.27 08/29/2015   HGBA1C 6.6 (H) 09/29/2017   MICROALBUR 2.0 (H) 05/21/2017    No results found.  Assessment & Plan:   Problem List Items Addressed This Visit    Venous insufficiency of both lower extremities    NOTED ON EXAM DUE TO HEMOSIDERON STAINS ON LEGS.  compredssion knees recommended        Screening for prostate cancer    Medicare PSA ordered.  Prostate was visualized during colonoscopy July 2018      Hypertension    Well controlled on current regimen. Renal function and potassium level are due ; no changes  today.      Relevant Orders    Comprehensive metabolic panel   Hyperlipidemia associated with type 2 diabetes mellitus ([place])    LDL and triglycerides have been historically at goal on current medications without side effects or  liver enzymes elevation.  Repeat today   Lab Results  Component Value Date   CHOL 110 05/21/2017   HDL 40.50 05/21/2017   LDLCALC 61 05/21/2017   LDLDIRECT 60.0 09/29/2017   TRIG 44.0 05/21/2017   CHOLHDL 3 05/21/2017   Lab Results  Component Value Date   ALT 29 09/29/2017   AST 25 09/29/2017   ALKPHOS 55 09/29/2017   BILITOT 0.6 09/29/2017          Relevant Orders   Lipid  panel   Controlled diabetes mellitus with microalbuminuria ([place])    Previously Well controlled on onglyza, now on Januvia dure to [place] formulary changes,  without side effects. .  He has microalbuminuria and did not tolerate ace inhibitor,  And has had repeated elevations in his  potassium on losartan . Discussed recent aspirin study and recommended that he continue daily asa and statin   Lab Results  Component Value Date   HGBA1C 6.6 (H) 09/29/2017   Lab Results  Component Value Date   MICROALBUR 2.0 (H) 05/21/2017         Relevant Orders   Hemoglobin A1c   Microalbumin / creatinine urine ratio   PSA, Medicare    Other Visit Diagnoses    Prostate cancer screening    -  Primary   Venous insufficiency       Relevant Orders   DME Other see comment      A total of 25 minutes of face to face time was spent with patient more than half of which was spent in counselling about the above mentioned conditions  and coordination of care   I have discontinued Elliot Dally. Keil's saxagliptin HCl. I am also having him maintain his CoQ10, Fish Oil, fluticasone, Glucosamine-Chondroitin, Misc Natural Products (OSTEO BI-FLEX TRIPLE STRENGTH PO), vitamin C, aspirin EC, Nutritional Supplements (NUTRITIONAL DRINK PO), CINNAMON PO, GLUCOSAMINE SULFATE PO, amLODipine, glucose blood, hydrochlorothiazide, simvastatin, and  sitaGLIPtin.  No orders of the defined types were placed in this encounter.   Medications Discontinued During This Encounter  Medication Reason  . saxagliptin HCl (ONGLYZA) 5 MG TABS tablet Patient has not taken in last 30 days    Follow-up: Return in about 6 months (around 09/29/2018).   Crecencio Mc, MD

## 2018-03-30 NOTE — Assessment & Plan Note (Signed)
Medicare PSA ordered.  Prostate was visualized during colonoscopy July 2018

## 2018-03-30 NOTE — Assessment & Plan Note (Signed)
Well controlled on current regimen. Renal function and potassium level are due ; no changes  today.

## 2018-03-30 NOTE — Patient Instructions (Addendum)
despite the recent news about the risk of aspirin,  You are still recommended to take a baby aspirin daily as primary prevention for heart attack,  Because people with diabetes are at much greater risk for heart attack than the average person    [email or web address]  has a great selection of compression  Knees highs and sweat socks to help  Your fluid retention   From venous insufficiency   Chronic Venous Insufficiency Chronic venous insufficiency, also called venous stasis, is a condition that prevents blood from being pumped effectively through the veins in your legs. Blood may no longer be pumped effectively from the legs back to the heart. This condition can range from mild to severe. With proper treatment, you should be able to continue with an active life. What are the causes? Chronic venous insufficiency occurs when the vein walls become stretched, weakened, or damaged, or when valves within the vein are damaged. Some common causes of this include:  High blood pressure inside the veins (venous hypertension).  Increased blood pressure in the leg veins from long periods of sitting or standing.  A blood clot that blocks blood flow in a vein (deep vein thrombosis, DVT).  Inflammation of a vein (phlebitis) that causes a blood clot to form.  Tumors in the pelvis that cause blood to back up.  What increases the risk? The following factors may make you more likely to develop this condition:  Having a family history of this condition.  Obesity.  Pregnancy.  Living without enough physical activity or exercise (sedentary lifestyle).  Smoking.  Having a job that requires long periods of standing or sitting in one place.  Being a certain age. Women in their 44s and 40s and men in their 51s are more likely to develop this condition.  What are the signs or symptoms? Symptoms of this condition include:  Veins that are enlarged, bulging, or twisted (varicose veins).  Skin breakdown or  ulcers.  Reddened or discolored skin on the front of the leg.  Brown, smooth, tight, and painful skin just above the ankle, usually on the inside of the leg (lipodermatosclerosis).  Swelling.  How is this diagnosed? This condition may be diagnosed based on:  Your medical history.  A physical exam.  Tests, such as: ? A procedure that creates an image of a blood vessel and nearby organs and provides information about blood flow through the blood vessel (duplex ultrasound). ? A procedure that tests blood flow (plethysmography). ? A procedure to look at the veins using X-ray and dye (venogram).  How is this treated? The goals of treatment are to help you return to an active life and to minimize pain or disability. Treatment depends on the severity of your condition, and it may include:  Wearing compression stockings. These can help relieve symptoms and help prevent your condition from getting worse. However, they do not cure the condition.  Sclerotherapy. This is a procedure involving an injection of a material that "dissolves" damaged veins.  Surgery. This may involve: ? Removing a diseased vein (vein stripping). ? Cutting off blood flow through the vein (laser ablation surgery). ? Repairing a valve.  Follow these instructions at home:  Wear compression stockings as told by your health care provider. These stockings help to prevent blood clots and reduce swelling in your legs.  Take over-the-counter and prescription medicines only as told by your health care provider.  Stay active by exercising, walking, or doing different activities. Ask your health care provider  what activities are safe for you and how much exercise you need.  Drink enough fluid to keep your urine clear or pale yellow.  Do not use any products that contain nicotine or tobacco, such as cigarettes and e-cigarettes. If you need help quitting, ask your health care provider.  Keep all follow-up visits as told by  your health care provider. This is important. Contact a health care provider if:  You have redness, swelling, or more pain in the affected area.  You see a red streak or line that extends up or down from the affected area.  You have skin breakdown or a loss of skin in the affected area, even if the breakdown is small.  You get an injury in the affected area. Get help right away if:  You get an injury and an open wound in the affected area.  You have severe pain that does not get better with medicine.  You have sudden numbness or weakness in the foot or ankle below the affected area, or you have trouble moving your foot or ankle.  You have a fever and you have worse or persistent symptoms.  You have chest pain.  You have shortness of breath. Summary  Chronic venous insufficiency, also called venous stasis, is a condition that prevents blood from being pumped effectively through the veins in your legs.  Chronic venous insufficiency occurs when the vein walls become stretched, weakened, or damaged, or when valves within the vein are damaged.  Treatment for this condition depends on how severe your condition is, and it may involve wearing compression stockings or having a procedure.  Make sure you stay active by exercising, walking, or doing different activities. Ask your health care provider what activities are safe for you and how much exercise you need. This information is not intended to replace advice given to you by your health care provider. Make sure you discuss any questions you have with your health care provider. Document Released: 03/31/2007 Document Revised: 10/14/2016 Document Reviewed: 10/14/2016 Elsevier Interactive Patient Education  2017 [employer].    .  How to Use Compression Stockings Compression stockings are elastic socks that squeeze the legs. They help to increase blood flow to the legs, decrease swelling in the legs, and reduce the chance of  developing blood clots in the lower legs. Compression stockings are often used by people who:  Are recovering from surgery.  Have poor circulation in their legs.  Are prone to getting blood clots in their legs.  Have varicose veins.  Sit or stay in bed for long periods of time.  How to use compression stockings Before you put on your compression stockings:  Make sure that they are the correct size. If you do not know your size, ask your health care provider.  Make sure that they are clean, dry, and in good condition.  Check them for rips and tears. Do not put them on if they are ripped or torn.  Put your stockings on first thing in the morning, before you get out of bed. Keep them on for as long as your health care provider advises. When you are wearing your stockings:  Keep them as smooth as possible. Do not allow them to bunch up. It is especially important to prevent the stockings from bunching up around your toes or behind your knees.  Do not roll the stockings downward and leave them rolled down. This can decrease blood flow to your leg.  Change them right away  if they become wet or dirty.  When you take off your stockings, inspect your legs and feet. Anything that does not seem normal may require medical attention. Look for:  Open sores.  Red spots.  Swelling.  Information and tips  Do not stop wearing your compression stockings without talking to your health care provider first.  Wash your stockings every day with mild detergent in cold or warm water. Do not use bleach. Air-dry your stockings or dry them in a clothes dryer on low heat.  Replace your stockings every 3-6 months.  If skin moisturizing is part of your treatment plan, apply lotion or cream at night so that your skin will be dry when you put on the stockings in the morning. It is harder to put the stockings on when you have lotion on your legs or feet. Contact a health care provider if: Remove your  stockings and seek medical care if:  You have a feeling of pins and needles in your feet or legs.  You have any new changes in your skin.  You have skin lesions that are getting worse.  You have swelling or pain that is getting worse.  Get help right away if:  You have numbness or tingling in your lower legs that does not get better right after you take the stockings off.  Your toes or feet become cold and blue.  You develop open sores or red spots on your legs that do not go away.  You see or feel a warm spot on your leg.  You have new swelling or soreness in your leg.  You are short of breath or you have chest pain for no reason.  You have a rapid or irregular heartbeat.  You feel light-headed or dizzy. This information is not intended to replace advice given to you by your health care provider. Make sure you discuss any questions you have with your health care provider. Document Released: 09/22/2009 Document Revised: 04/24/2016 Document Reviewed: 11/02/2014 Elsevier Interactive Patient Education  [employer].

## 2018-03-30 NOTE — Assessment & Plan Note (Signed)
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  compredssion knees recommended

## 2018-04-01 ENCOUNTER — Other Ambulatory Visit: Payer: Self-pay | Admitting: Internal Medicine

## 2018-04-01 DIAGNOSIS — E1129 Type 2 diabetes mellitus with other diabetic kidney complication: Secondary | ICD-10-CM

## 2018-04-01 DIAGNOSIS — R809 Proteinuria, unspecified: Principal | ICD-10-CM

## 2018-04-01 MED ORDER — SITAGLIPTIN PHOSPHATE 100 MG PO TABS: 100.0000 mg | ORAL_TABLET | Freq: Every day | ORAL | 1 refills | Status: DC

## 2018-04-01 NOTE — Assessment & Plan Note (Signed)
Loss of control noted  januvia increased to 100 mg

## 2018-04-16 DIAGNOSIS — M9901 Segmental and somatic dysfunction of cervical region: Secondary | ICD-10-CM | POA: Diagnosis not present

## 2018-04-16 DIAGNOSIS — M5136 Other intervertebral disc degeneration, lumbar region: Secondary | ICD-10-CM | POA: Diagnosis not present

## 2018-04-16 DIAGNOSIS — M9903 Segmental and somatic dysfunction of lumbar region: Secondary | ICD-10-CM | POA: Diagnosis not present

## 2018-04-16 DIAGNOSIS — M955 Acquired deformity of pelvis: Secondary | ICD-10-CM | POA: Diagnosis not present

## 2018-04-16 DIAGNOSIS — M9905 Segmental and somatic dysfunction of pelvic region: Secondary | ICD-10-CM | POA: Diagnosis not present

## 2018-04-16 DIAGNOSIS — M531 Cervicobrachial syndrome: Secondary | ICD-10-CM | POA: Diagnosis not present

## 2018-04-29 ENCOUNTER — Ambulatory Visit (INDEPENDENT_AMBULATORY_CARE_PROVIDER_SITE_OTHER): Payer: Medicare Other

## 2018-04-29 VITALS — BP 118/68 | HR 49 | Temp 97.7°F | Resp 14 | Ht 70.0 in | Wt 215.4 lb

## 2018-04-29 DIAGNOSIS — Z0000 Encounter for general adult medical examination without abnormal findings: Secondary | ICD-10-CM

## 2018-04-29 NOTE — Progress Notes (Signed)
Subjective:   Richard Hardy is a 72 y.o. male who presents for Medicare Annual/Subsequent preventive examination.  Review of Systems:  No ROS.  Medicare Wellness Visit. Additional risk factors are reflected in the social history.  Cardiac Risk Factors include: advanced age (>57men, >4 women);male gender;hypertension;diabetes mellitus     Objective:    Vitals: BP 118/68 (BP Location: Right Arm, Patient Position: Sitting, Cuff Size: Normal)   Pulse (!) 49   Temp 97.7 F (36.5 C) (Oral)   Resp 14   Ht 5\' 10"  (1.778 m)   Wt 215 lb 6.4 oz (97.7 kg)   SpO2 95%   BMI 30.91 kg/m   Body mass index is 30.91 kg/m.  Advanced Directives 04/29/2018 06/23/2017 04/28/2017  Does Patient Have a Medical Advance Directive? Yes Yes Yes  Type of Paramedic of [place];Living will - [place];Living will  Does patient want to make changes to medical advance directive? No - Patient declined - No - Patient declined  Copy of [place] in Chart? Yes - Yes    Tobacco Social History   Tobacco Use  Smoking Status Former Smoker  . Packs/day: 0.50  . Years: 4.00  . Pack years: 2.00  . Last attempt to quit: 12/10/1963  . Years since quitting: 54.4  Smokeless Tobacco Never Used     Counseling given: Not Answered   Clinical Intake:  Pre-visit preparation completed: Yes  Pain : No/denies pain     Diabetes: Yes  How often do you need to have someone help you when you read instructions, pamphlets, or other written materials from your doctor or pharmacy?: 1 - Never  Interpreter Needed?: No     Past Medical History:  Diagnosis Date  . Diabetes mellitus without complication ([place]) 8756  . Glucose intolerance (impaired glucose tolerance)   . Hyperlipidemia   . Obesity   . Sleep apnea    On BiPAP, Dr. Richardson Landry  . 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 Grayland Ormond   Past Surgical History:  Procedure Laterality Date  . CATARACT EXTRACTION,  BILATERAL    . COLONOSCOPY WITH PROPOFOL N/A 06/23/2017   Procedure: COLONOSCOPY WITH PROPOFOL;  Surgeon: Manya Silvas, MD;  Location: [facility] ENDOSCOPY;  Service: Endoscopy;  Laterality: N/A;  . EYE SURGERY    . LITHOTRIPSY     Dr. Bernardo Heater  . NASAL SEPTUM SURGERY     Family History  Problem Relation Age of Onset  . Cancer Mother        brain  . Heart attack Father 5  . Heart disease Father   . Heart disease Brother        s/p CABG  . Crohn's disease Brother   . Hyperlipidemia Son   . Hypertension Son   . Sleep apnea Son   . Thyroid disease Son   . Heart disease Paternal Grandfather    Social History   Socioeconomic History  . Marital status: Married    Spouse name: Not on file  . Number of children: Not on file  . Years of education: Not on file  . Highest education level: Not on file  Occupational History  . Not on file  Social Needs  . Financial resource strain: Not hard at all  . Food insecurity:    Worry: Never true    Inability: Never true  . Transportation needs:    Medical: No    Non-medical: No  Tobacco Use  . Smoking status:  Former Smoker    Packs/day: 0.50    Years: 4.00    Pack years: 2.00    Last attempt to quit: 12/10/1963    Years since quitting: 54.4  . Smokeless tobacco: Never Used  Substance and Sexual Activity  . Alcohol use: Yes    Comment: Occasional  . Drug use: No  . Sexual activity: Not on file  Lifestyle  . Physical activity:    Days per week: Not on file    Minutes per session: Not on file  . Stress: Only a little  Relationships  . Social connections:    Talks on phone: More than three times a week    Gets together: Not on file    Attends religious service: Not on file    Active member of club or organization: Yes    Attends meetings of clubs or organizations: More than 4 times per year    Relationship status: Married  Other Topics Concern  . Not on file  Social History Narrative   Lives in [place] with wife. 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 amLODipine (NORVASC) 5 MG tablet Take 1 tablet (5 mg total) by mouth daily.  Marland Kitchen aspirin EC 81 MG tablet Take 1 tablet (81 mg total) by mouth daily.  Marland Kitchen CINNAMON PO Take 2 capsules by mouth daily.  . Coenzyme Q10 (COQ10) 200 MG CAPS Take 1 tablet by mouth daily.    . Fish Oil OIL Take by mouth daily.    . fluticasone (FLONASE) 50 MCG/ACT nasal spray Place into the nose at bedtime.    . Glucosamine-Chondroit-Vit C-Mn (GLUCOSAMINE-CHONDROITIN) CAPS Take 2 capsules by mouth daily.    Marland Kitchen glucose blood (BAYER CONTOUR TEST) test strip Check blood sugar 1-2 times daily .  . hydrochlorothiazide (HYDRODIURIL) 25 MG tablet Take 1 tablet (25 mg total) by mouth daily.  . Misc Natural Products (OSTEO BI-FLEX TRIPLE STRENGTH PO) Take by mouth daily.  . Nutritional Supplements (NUTRITIONAL DRINK PO) Take 2 packets by mouth daily.  . simvastatin (ZOCOR) 40 MG tablet TAKE 1 BY MOUTH DAILY AT 6PM  . sitaGLIPtin (JANUVIA) 100 MG tablet Take 1 tablet (100 mg total) by mouth daily.  . vitamin C (ASCORBIC ACID) 500 MG tablet Take 500 mg by mouth daily.  . [DISCONTINUED] GLUCOSAMINE SULFATE PO Take by mouth.   No facility-administered encounter medications on file as of 04/29/2018.     Activities of Daily Living In your present state of health, do you have any difficulty performing the following activities: 04/29/2018  Hearing? N  Vision? N  Difficulty concentrating or making decisions? N  Walking or climbing stairs? N  Dressing or bathing? N  Doing errands, shopping? N  Preparing Food and eating ? N  Using the Toilet? N  In the past six months, have you accidently leaked urine? N  Do you have problems with loss of bowel control? N  Managing your Medications? N  Managing your  Finances? N  Housekeeping or managing your Housekeeping? N  Some recent data might be hidden    Patient Care Team: Crecencio Mc, MD as PCP - General (Internal Medicine) Jackolyn Confer, MD (Internal Medicine) Minna Merritts, MD as Consulting Physician (  Cardiology)   Assessment:   This is a routine wellness examination for [facility].  The goal of the wellness visit is to assist the patient how to close the gaps in care and create a preventative care plan for the patient.   The roster of all physicians providing medical care to patient is listed in the Snapshot section of the chart.  Osteoporosis risk reviewed.    Safety issues reviewed; Smoke and carbon monoxide detectors in the home. No firearms in the home. Wears seatbelts when driving or riding with others. No violence in the home.  They do not have excessive sun exposure.  Discussed the need for sun protection: hats, long sleeves and the use of sunscreen if there is significant sun exposure.  Patient is alert, normal appearance, oriented to person/place/and time.  Correctly identified the president of the [place] and recalls of 3/3 words. Performs simple calculations and can read correct time from watch face. Displays appropriate judgement.  No new identified risk were noted.  No failures at ADL's or IADL's.   BMI- discussed the importance of a healthy diet, water intake and the benefits of aerobic exercise. Educational material provided.   24 hour diet recall: Low carb diet  Dental- UTD.  Eye- Visual acuity not assessed per patient preference since they have regular follow up with the ophthalmologist.  Wears corrective lenses.  Sleep patterns- Sleeps 6-8 hours at night.  Wakes feeling rested.   Health maintenance gaps- closed.  Patient Concerns: None at this time. Follow up with PCP as needed.  Exercise Activities and Dietary recommendations Current Exercise Habits: Home exercise routine, Type of exercise:  walking, Time (Minutes): 20, Frequency (Times/Week): 4, Weekly Exercise (Minutes/Week): 80, Intensity: Moderate  Goals    . Maintain Healthy Lifestyle        Fall Risk Fall Risk  04/29/2018 04/28/2017 05/20/2016 08/29/2015 02/22/2015  Falls in the past year? No No No No No   Depression Screen PHQ 2/9 Scores 04/29/2018 04/28/2017 05/20/2016 08/29/2015  PHQ - 2 Score 0 0 0 0  PHQ- 9 Score - 0 - -    Cognitive Function MMSE - Mini Mental State Exam 04/29/2018 04/28/2017  Orientation to time 5 5  Orientation to Place 5 5  Registration 3 3  Attention/ Calculation 5 5  Recall 3 3  Language- name 2 objects 2 2  Language- repeat 1 1  Language- follow 3 step command 3 3  Language- read & follow direction 1 1  Write a sentence 1 1  Copy design 1 1  Total score 30 30        Immunization History  Administered Date(s) Administered  . Influenza, High Dose Seasonal PF 11/20/2016, 09/29/2017  . Influenza,inj,Quad PF,6+ Mos 08/26/2014, 08/29/2015  . Influenza-Unspecified 09/20/2012, 09/24/2013  . Pneumococcal Conjugate-13 05/25/2014  . Pneumococcal Polysaccharide-23 07/21/2012  . Tdap 06/20/2013  . Zoster 06/20/2013   Screening Tests Health Maintenance  Topic Date Due  . INFLUENZA VACCINE  07/09/2018  . HEMOGLOBIN A1C  09/29/2018  . OPHTHALMOLOGY EXAM  01/02/2019  . FOOT EXAM  03/31/2019  . URINE MICROALBUMIN  03/31/2019  . TETANUS/TDAP  06/21/2023  . COLONOSCOPY  06/24/2027  . Hepatitis C Screening  Completed      Plan:    End of life planning; Advance aging; Advanced directives discussed. Copy of current HCPOA/Living Will on file.    I have personally reviewed and noted the following in the patient's chart:   . Medical and social history . Use of  alcohol, tobacco or illicit drugs  . Current medications and supplements . Functional ability and status . Nutritional status . Physical activity . Advanced directives . List of other physicians . Hospitalizations, surgeries,  and ER visits in previous 12 months . Vitals . Screenings to include cognitive, depression, and falls . Referrals and appointments  In addition, I have reviewed and discussed with patient certain preventive protocols, quality metrics, and best practice recommendations. A written personalized care plan for preventive services as well as general preventive health recommendations were provided to patient.     Varney Biles, LPN  [id number]

## 2018-04-29 NOTE — Patient Instructions (Addendum)
  [name] , Thank you for taking time to come for your Medicare Wellness Visit. I appreciate your ongoing commitment to your health goals. Please review the following plan we discussed and let me know if I can assist you in the future.   These are the goals we discussed: Goals    . Maintain Healthy Lifestyle        This is a list of the screening recommended for you and due dates:  Health Maintenance  Topic Date Due  . Flu Shot  07/09/2018  . Hemoglobin A1C  09/29/2018  . Eye exam for diabetics  01/02/2019  . Complete foot exam   03/31/2019  . Urine Protein Check  03/31/2019  . Tetanus Vaccine  06/21/2023  . Colon Cancer Screening  06/24/2027  .  Hepatitis C: One time screening is recommended by Center for Disease Control  (CDC) for  adults born from [birth year] through 1967.   Completed

## 2018-05-05 ENCOUNTER — Other Ambulatory Visit: Payer: Self-pay | Admitting: Internal Medicine

## 2018-05-14 DIAGNOSIS — M9901 Segmental and somatic dysfunction of cervical region: Secondary | ICD-10-CM | POA: Diagnosis not present

## 2018-05-14 DIAGNOSIS — M9905 Segmental and somatic dysfunction of pelvic region: Secondary | ICD-10-CM | POA: Diagnosis not present

## 2018-05-14 DIAGNOSIS — M531 Cervicobrachial syndrome: Secondary | ICD-10-CM | POA: Diagnosis not present

## 2018-05-14 DIAGNOSIS — M5136 Other intervertebral disc degeneration, lumbar region: Secondary | ICD-10-CM | POA: Diagnosis not present

## 2018-05-14 DIAGNOSIS — M9903 Segmental and somatic dysfunction of lumbar region: Secondary | ICD-10-CM | POA: Diagnosis not present

## 2018-05-14 DIAGNOSIS — M955 Acquired deformity of pelvis: Secondary | ICD-10-CM | POA: Diagnosis not present

## 2018-06-02 ENCOUNTER — Other Ambulatory Visit: Payer: Self-pay | Admitting: Internal Medicine

## 2018-06-02 MED ORDER — SITAGLIPTIN PHOSPHATE 50 MG PO TABS: 50.0000 mg | ORAL_TABLET | Freq: Every day | ORAL | 1 refills | Status: DC

## 2018-06-18 DIAGNOSIS — M9901 Segmental and somatic dysfunction of cervical region: Secondary | ICD-10-CM | POA: Diagnosis not present

## 2018-06-18 DIAGNOSIS — M531 Cervicobrachial syndrome: Secondary | ICD-10-CM | POA: Diagnosis not present

## 2018-06-18 DIAGNOSIS — M5136 Other intervertebral disc degeneration, lumbar region: Secondary | ICD-10-CM | POA: Diagnosis not present

## 2018-06-18 DIAGNOSIS — M9903 Segmental and somatic dysfunction of lumbar region: Secondary | ICD-10-CM | POA: Diagnosis not present

## 2018-06-18 DIAGNOSIS — M9905 Segmental and somatic dysfunction of pelvic region: Secondary | ICD-10-CM | POA: Diagnosis not present

## 2018-06-18 DIAGNOSIS — M955 Acquired deformity of pelvis: Secondary | ICD-10-CM | POA: Diagnosis not present

## 2018-06-20 NOTE — Progress Notes (Signed)
Patient ID: Richard Hardy, male   DOB: 07-21-1946, 72 y.o.   MRN: [id number] Cardiology Office Note  Date:  06/22/2018   ID:  Keatyn, Jawad September 17, 1946, MRN [id number]  PCP:  Crecencio Mc, MD   Chief Complaint  Patient presents with  . other    12 month follow up. Meds reviewed by the pt. verbally. "doing well."     HPI:  Richard Hardy is a 72 year old male with a history of  hyperlipidemia,  glucose intolerance, obstructive sleep apnea on CPAP,  Borderline diabetes,  Asymptomatic bradycardia strong family history of coronary artery disease  who returns for routine followup of his hyperlipidemia. 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   Continues to work long hours ,  Environmental education officer,  Several medication changes with his diabetes pills Onglyza too expensive  Lots of activity at work No symptoms of chest pain or shortness of breath with exertion.  Hemoglobin A1c 6.9,  Weight down 10 pounds  Total chol 115 LDL 61  EKG on today's visit shows sinus rhythm with rate 51 bpm, no significant ST or T-wave changes Heart rate typically runs in the 50s  Other past medical history seen by Dr. Grayland Ormond of hematology oncology for prior drop in his platelets. Aspirin was held through this.    Myoview in 2009 which showed an EF of 58% with no evidence of ischemia or infarct.  Previous ABIs which were normal at 1.3 on the right and 1.2 on the left.  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  PSH:    Past Surgical History:  Procedure Laterality Date  . CATARACT EXTRACTION, BILATERAL    . COLONOSCOPY WITH PROPOFOL N/A 06/23/2017   Procedure: COLONOSCOPY WITH PROPOFOL;  Surgeon: Manya Silvas, MD;  Location: [facility] ENDOSCOPY;  Service: Endoscopy;  Laterality: N/A;  . EYE SURGERY    . LITHOTRIPSY      Dr. Bernardo Heater  . NASAL SEPTUM SURGERY      Current Outpatient Medications  Medication Sig Dispense Refill  . amLODipine (NORVASC) 5 MG tablet TAKE 1 TABLET BY MOUTH EVERY DAY 90 tablet 3  . aspirin EC 81 MG tablet Take 1 tablet (81 mg total) by mouth daily. 90 tablet 4  . CINNAMON PO Take 2 capsules by mouth daily.    . Coenzyme Q10 (COQ10) 200 MG CAPS Take 1 tablet by mouth daily.      . Fish Oil OIL Take by mouth daily.      . fluticasone (FLONASE) 50 MCG/ACT nasal spray Place into the nose at bedtime.      . Glucosamine-Chondroit-Vit C-Mn (GLUCOSAMINE-CHONDROITIN) CAPS Take 2 capsules by mouth daily.      Marland Kitchen glucose blood (BAYER CONTOUR TEST) test strip Check blood sugar 1-2 times daily . 100 each 12  . hydrochlorothiazide (HYDRODIURIL) 25 MG tablet Take 1 tablet (25 mg total) by mouth daily. 90 tablet 3  . Misc Natural Products (OSTEO BI-FLEX TRIPLE STRENGTH PO) Take by mouth daily.    . Nutritional Supplements (NUTRITIONAL DRINK PO) Take 2 packets by mouth daily.    . simvastatin (ZOCOR) 40 MG tablet TAKE 1 BY MOUTH DAILY AT 6PM 90 tablet 1  . sitaGLIPtin (JANUVIA) 50 MG tablet Take 1 tablet (50 mg total) by mouth daily. 90 tablet 1  . vitamin C (ASCORBIC ACID) 500 MG tablet Take 500 mg by  mouth daily.     No current facility-administered medications for this visit.      Allergies:   Losartan; Lisinopril; Metformin and related; Sulfonamide derivatives; and Augmentin [amoxicillin-pot clavulanate]   Social History:  The patient  reports that he quit smoking about 54 years ago. He has a 2.00 pack-year smoking history. He has never used smokeless tobacco. He reports that he drinks alcohol. He reports that he does not use drugs.   Family History:   family history includes Cancer in his mother; Crohn's disease in his brother; Heart attack (age of onset: 35) in his father; Heart disease in his brother, father, and paternal grandfather; Hyperlipidemia in his son; Hypertension in his son;  Sleep apnea in his son; Thyroid disease in his son.    Review of Systems: Review of Systems  Constitutional: Negative.   HENT: Negative.   Respiratory: Negative.   Cardiovascular: Negative.   Gastrointestinal: Negative.   Musculoskeletal: Negative.   Neurological: Negative.   Psychiatric/Behavioral: Negative.   All other systems reviewed and are negative.    PHYSICAL EXAM: VS:  BP 120/70 (BP Location: Left Arm, Patient Position: Sitting, Cuff Size: Normal)   Pulse (!) 51   Ht 5\' 10"  (1.778 m)   Wt 208 lb 12 oz (94.7 kg)   BMI 29.95 kg/m  , BMI Body mass index is 29.95 kg/m. Constitutional:  oriented to person, place, and time. No distress.  HENT:  Head: Normocephalic and atraumatic.  Eyes:  no discharge. No scleral icterus.  Neck: Normal range of motion. Neck supple. No JVD present.  Cardiovascular: Normal rate, regular rhythm, normal heart sounds and intact distal pulses. Exam reveals no gallop and no friction rub. No edema No murmur heard. Pulmonary/Chest: Effort normal and breath sounds normal. No stridor. No respiratory distress.  no wheezes.  no rales.  no tenderness.  Abdominal: Soft.  no distension.  no tenderness.  Musculoskeletal: Normal range of motion.  no  tenderness or deformity.  Neurological:  normal muscle tone. Coordination normal. No atrophy Skin: Skin is warm and dry. No rash noted. not diaphoretic.  Psychiatric:  normal mood and affect. behavior is normal. Thought content normal.    Recent Labs: 03/30/2018: ALT 25; BUN 18; Creatinine, Ser 0.99; Potassium 4.7; Sodium 142    Lipid Panel Lab Results  Component Value Date   CHOL 115 03/30/2018   HDL 42.20 03/30/2018   LDLCALC 60 03/30/2018   TRIG 64.0 03/30/2018      Wt Readings from Last 3 Encounters:  06/22/18 208 lb 12 oz (94.7 kg)  04/29/18 215 lb 6.4 oz (97.7 kg)  03/30/18 216 lb 9.6 oz (98.2 kg)       ASSESSMENT AND PLAN:  Essential hypertension - Plan: EKG 12-Lead With lower  weight blood pressure may start to run low Suggested he monitor blood pressure at home and if he has drops and pressures especially with standing potentially could cut the HCTZ in half If weight continues to drop may need HCTZ as needed for ankle swelling Might be worth even trying very low-dose ARB mixed with HCTZ 12.5 Previous had problems with high potassium on ARB alone  Hyperlipemia Screening study discussed with him, CT coronary calcium score numbers at goal  Controlled type 2 diabetes mellitus without complication, without long-term current use of insulin ([place]) - Plan: EKG 12-Lead We have encouraged continued exercise, careful diet management in an effort to lose weight. Diet discussed with him Is down 10 pounds  Bradycardia -  Asymptomatic  bradycardia   Total encounter time more than 25 minutes  Greater than 50% was spent in counseling and coordination of care with the patient   Disposition:   F/U as needed   Orders Placed This Encounter  Procedures  . EKG 12-Lead     Signed, Esmond Plants, M.D., Ph.D. 06/22/2018  [place], [place]

## 2018-06-22 ENCOUNTER — Ambulatory Visit (INDEPENDENT_AMBULATORY_CARE_PROVIDER_SITE_OTHER): Payer: Medicare Other | Admitting: Cardiovascular Disease

## 2018-06-22 ENCOUNTER — Encounter: Payer: Self-pay | Admitting: Cardiovascular Disease

## 2018-06-22 VITALS — BP 120/70 | HR 51 | Ht 70.0 in | Wt 208.8 lb

## 2018-06-22 DIAGNOSIS — E785 Hyperlipidemia, unspecified: Secondary | ICD-10-CM

## 2018-06-22 DIAGNOSIS — R809 Proteinuria, unspecified: Secondary | ICD-10-CM

## 2018-06-22 DIAGNOSIS — E1129 Type 2 diabetes mellitus with other diabetic kidney complication: Secondary | ICD-10-CM | POA: Diagnosis not present

## 2018-06-22 DIAGNOSIS — I471 Supraventricular tachycardia: Secondary | ICD-10-CM

## 2018-06-22 DIAGNOSIS — E1169 Type 2 diabetes mellitus with other specified complication: Secondary | ICD-10-CM

## 2018-06-22 DIAGNOSIS — I10 Essential (primary) hypertension: Secondary | ICD-10-CM

## 2018-06-22 DIAGNOSIS — R001 Bradycardia, unspecified: Secondary | ICD-10-CM | POA: Diagnosis not present

## 2018-06-22 NOTE — Patient Instructions (Addendum)
Medication Instructions:   No medication changes made  Labwork:  No new labs needed  Testing/Procedures:  No further testing at this time  Call the office if you would like a  CT coronary calcium score  $150   Follow-Up: It was a pleasure seeing you in the office today. Please call us if you have new issues that need to be addressed before your next appt.  [phone]  Your physician wants you to follow-up in: As needed  If you need a refill on your cardiac medications before your next appointment, please call your pharmacy.  For educational health videos Log in to : [email or web address] Or : [email or web address], password : [email or web address]

## 2018-07-09 ENCOUNTER — Other Ambulatory Visit: Payer: Self-pay | Admitting: Internal Medicine

## 2018-07-09 ENCOUNTER — Encounter: Payer: Self-pay | Admitting: Internal Medicine

## 2018-07-09 ENCOUNTER — Ambulatory Visit (INDEPENDENT_AMBULATORY_CARE_PROVIDER_SITE_OTHER): Payer: Medicare Other | Admitting: Internal Medicine

## 2018-07-09 VITALS — BP 130/76 | HR 42 | Temp 97.5°F | Resp 15 | Ht 70.0 in | Wt 202.8 lb

## 2018-07-09 DIAGNOSIS — I10 Essential (primary) hypertension: Secondary | ICD-10-CM | POA: Diagnosis not present

## 2018-07-09 DIAGNOSIS — L738 Other specified follicular disorders: Secondary | ICD-10-CM

## 2018-07-09 DIAGNOSIS — E663 Overweight: Secondary | ICD-10-CM | POA: Diagnosis not present

## 2018-07-09 DIAGNOSIS — E1169 Type 2 diabetes mellitus with other specified complication: Secondary | ICD-10-CM

## 2018-07-09 DIAGNOSIS — R809 Proteinuria, unspecified: Secondary | ICD-10-CM

## 2018-07-09 DIAGNOSIS — E785 Hyperlipidemia, unspecified: Secondary | ICD-10-CM

## 2018-07-09 DIAGNOSIS — E1129 Type 2 diabetes mellitus with other diabetic kidney complication: Secondary | ICD-10-CM

## 2018-07-09 LAB — COMPREHENSIVE METABOLIC PANEL
ALK PHOS: 44 U/L (ref 39–117)
ALT: 27 U/L (ref 0–53)
AST: 24 U/L (ref 0–37)
Albumin: 4.3 g/dL (ref 3.5–5.2)
BILIRUBIN TOTAL: 0.8 mg/dL (ref 0.2–1.2)
BUN: 24 mg/dL — ABNORMAL HIGH (ref 6–23)
CALCIUM: 9.5 mg/dL (ref 8.4–10.5)
CO2: 30 mEq/L (ref 19–32)
Chloride: 105 mEq/L (ref 96–112)
Creatinine, Ser: 1.06 mg/dL (ref 0.40–1.50)
GFR: 72.5 mL/min (ref >60.00)
Glucose, Bld: 124 mg/dL — ABNORMAL HIGH (ref 70–99)
Potassium: 4.5 mEq/L (ref 3.5–5.1)
Sodium: 141 mEq/L (ref 135–145)
TOTAL PROTEIN: 6.9 g/dL (ref 6.0–8.3)

## 2018-07-09 LAB — LIPID PANEL
Cholesterol: 105 mg/dL (ref 0–200)
HDL: 45.7 mg/dL (ref >39.00)
LDL Cholesterol: 51 mg/dL (ref 0–99)
NONHDL: 58.92
TRIGLYCERIDES: 38 mg/dL (ref 0.0–149.0)
Total CHOL/HDL Ratio: 2
VLDL: 7.6 mg/dL (ref 0.0–40.0)

## 2018-07-09 LAB — POCT GLYCOSYLATED HEMOGLOBIN (HGB A1C): Hemoglobin A1C: 6.1 % — AB (ref 4.0–5.6)

## 2018-07-09 MED ORDER — DOXYCYCLINE HYCLATE 100 MG PO TABS: 100.0000 mg | ORAL_TABLET | Freq: Two times a day (BID) | ORAL | 0 refills | Status: DC

## 2018-07-09 MED ORDER — SIMVASTATIN 40 MG PO TABS: ORAL_TABLET | ORAL | 1 refills | Status: DC

## 2018-07-09 NOTE — Patient Instructions (Signed)
Congratulations!  You have lowered your   a1c to 6.1!   Gold bond medicated powder with zinc in your neck crease  Doxycycline  100 mg twice daily with food for 7 days  Probiotic x 3 weeks

## 2018-07-09 NOTE — Progress Notes (Signed)
Subjective:  Patient ID: Richard Hardy, male    DOB: [birth year]  Age: 72 y.o. MRN: [id number]  CC: The primary encounter diagnosis was Controlled type 2 diabetes mellitus with microalbuminuria, without long-term current use of insulin ([place]). Diagnoses of Essential hypertension, Hyperlipidemia associated with type 2 diabetes mellitus ([place]), Folliculitis barbae, and Overweight (BMI 25.0-29.9) were also pertinent to this visit.  HPI Richard Hardy presents for 3 month follow up on diabetes, hypertension and hyperlipidemia.    Cc: Patient has   A singular complaint today of a recurrent neck rash that occurs in the crease of his posterior neck and is accompanied by pustules. His wife has ben treating it topically with triple antibiotic ointment and witch hazel.    DM:  .  Patient is following a low glycemic index diet and taking all prescribed medications regularly without side effects.  Fasting sugars have been under less than 140 most of the time and post prandials have been under 160 except on rare occasions. Patient is exercising about 3 times per week and intentionally trying to lose weight .  Patient has had an eye exam in the last 12 months and checks feet regularly for signs of infection.  Patient does not walk barefoot outside,  And denies an numbness tingling or burning in feet. Patient is up to date on all recommended vaccinations  Outpatient Medications Prior to Visit  Medication Sig Dispense Refill  . amLODipine (NORVASC) 5 MG tablet TAKE 1 TABLET BY MOUTH EVERY DAY 90 tablet 3  . aspirin EC 81 MG tablet Take 1 tablet (81 mg total) by mouth daily. 90 tablet 4  . CINNAMON PO Take 2 capsules by mouth daily.    . Coenzyme Q10 (COQ10) 200 MG CAPS Take 1 tablet by mouth daily.      . Fish Oil OIL Take by mouth daily.      . fluticasone (FLONASE) 50 MCG/ACT nasal spray Place into the nose at bedtime.      Marland Kitchen glucose blood (BAYER CONTOUR TEST) test strip Check blood sugar 1-2 times daily . 100  each 12  . hydrochlorothiazide (HYDRODIURIL) 25 MG tablet Take 1 tablet (25 mg total) by mouth daily. 90 tablet 3  . Misc Natural Products (OSTEO BI-FLEX TRIPLE STRENGTH PO) Take by mouth daily.    . Nutritional Supplements (NUTRITIONAL DRINK PO) Take 2 packets by mouth daily.    . sitaGLIPtin (JANUVIA) 50 MG tablet Take 1 tablet (50 mg total) by mouth daily. 90 tablet 1  . Turmeric 500 MG TABS Take 1 tablet by mouth daily.    . vitamin C (ASCORBIC ACID) 500 MG tablet Take 500 mg by mouth daily.    . simvastatin (ZOCOR) 40 MG tablet TAKE 1 BY MOUTH DAILY AT 6PM 90 tablet 1  . Glucosamine-Chondroit-Vit C-Mn (GLUCOSAMINE-CHONDROITIN) CAPS Take 2 capsules by mouth daily.       No facility-administered medications prior to visit.     Review of Systems;  Patient denies headache, fevers, malaise, unintentional weight loss, skin rash, eye pain, sinus congestion and sinus pain, sore throat, dysphagia,  hemoptysis , cough, dyspnea, wheezing, chest pain, palpitations, orthopnea, edema, abdominal pain, nausea, melena, diarrhea, constipation, flank pain, dysuria, hematuria, urinary  Frequency, nocturia, numbness, tingling, seizures,  Focal weakness, Loss of consciousness,  Tremor, insomnia, depression, anxiety, and suicidal ideation.      Objective:  BP 130/76 (BP Location: Left Arm, Patient Position: Sitting, Cuff Size: Normal)   Pulse (!) 42  Temp (!) 97.5 F (36.4 C) (Oral)   Resp 15   Ht 5\' 10"  (1.778 m)   Wt 202 lb 12.8 oz (92 kg)   SpO2 95%   BMI 29.10 kg/m   BP Readings from Last 3 Encounters:  07/09/18 130/76  06/22/18 120/70  04/29/18 118/68    Wt Readings from Last 3 Encounters:  07/09/18 202 lb 12.8 oz (92 kg)  06/22/18 208 lb 12 oz (94.7 kg)  04/29/18 215 lb 6.4 oz (97.7 kg)    General appearance: alert, cooperative and appears stated age Ears: normal TM's and external ear canals both ears Throat: lips, mucosa, and tongue normal; teeth and gums normal Neck: no  adenopathy, no carotid bruit, supple, symmetrical, trachea midline and thyroid not enlarged, symmetric, no tenderness/mass/nodules Back: symmetric, no curvature. ROM normal. No CVA tenderness. Lungs: clear to auscultation bilaterally Heart: regular rate and rhythm, S1, S2 normal, no murmur, click, rub or gallop Abdomen: soft, non-tender; bowel sounds normal; no masses,  no organomegaly Pulses: 2+ and symmetric Skin: Skin color, texture, turgor normal. No rashes or lesions Lymph nodes: Cervical, supraclavicular, and axillary nodes normal.  Lab Results  Component Value Date   HGBA1C 6.1 (A) 07/09/2018   HGBA1C 7.9 (H) 03/30/2018   HGBA1C 6.6 (H) 09/29/2017    Lab Results  Component Value Date   CREATININE 1.06 07/09/2018   CREATININE 0.99 03/30/2018   CREATININE 0.93 09/29/2017    Lab Results  Component Value Date   WBC 5.7 08/29/2015   HGB 15.6 08/29/2015   HCT 45.3 08/29/2015   PLT 167.0 08/29/2015   GLUCOSE 124 (H) 07/09/2018   CHOL 105 07/09/2018   TRIG 38.0 07/09/2018   HDL 45.70 07/09/2018   LDLDIRECT 60.0 09/29/2017   LDLCALC 51 07/09/2018   ALT 27 07/09/2018   AST 24 07/09/2018   NA 141 07/09/2018   K 4.5 07/09/2018   CL 105 07/09/2018   CREATININE 1.06 07/09/2018   BUN 24 (H) 07/09/2018   CO2 30 07/09/2018   TSH 2.45 08/12/2007   PSA 0.19 03/30/2018   HGBA1C 6.1 (A) 07/09/2018   MICROALBUR 1.3 03/30/2018    No results found.  Assessment & Plan:   Problem List Items Addressed This Visit    Controlled diabetes mellitus with microalbuminuria ([place]) - Primary    Currently well-controlled on current medications .  hemoglobin A1c is at goal of less than 7.0 . Patient is reminded to schedule an annual eye exam and foot exam is normal today. Patient has no microalbuminuria. Patient is tolerating statin therapy for CAD risk reduction and on ACE/ARB for renal protection and hypertension   Lab Results  Component Value Date   HGBA1C 6.1 (A) 07/09/2018   Lab  Results  Component Value Date   MICROALBUR 1.3 03/30/2018         Relevant Medications   simvastatin (ZOCOR) 40 MG tablet   Other Relevant Orders   POCT HgB A1C (Completed)   Comprehensive metabolic panel (Completed)   Lipid panel (Completed)   Hypertension    Well controlled for age on current regimen. Renal function stable, no changes today.  Lab Results  Component Value Date   CREATININE 1.06 07/09/2018   Lab Results  Component Value Date   NA 141 07/09/2018   K 4.5 07/09/2018   CL 105 07/09/2018   CO2 30 07/09/2018         Relevant Medications   simvastatin (ZOCOR) 40 MG tablet   Hyperlipidemia associated with type  2 diabetes mellitus ([facility])    LDL and triglycerides remain at goal on current medications without side effects or  liver enzymes elevation.  no changes  today   Lab Results  Component Value Date   CHOL 105 07/09/2018   HDL 45.70 07/09/2018   LDLCALC 51 07/09/2018   LDLDIRECT 60.0 09/29/2017   TRIG 38.0 07/09/2018   CHOLHDL 2 07/09/2018   Lab Results  Component Value Date   ALT 27 07/09/2018   AST 24 07/09/2018   ALKPHOS 44 07/09/2018   BILITOT 0.8 07/09/2018          Relevant Medications   simvastatin (ZOCOR) 40 MG tablet   Overweight (BMI 25.0-29.9)    I have congratulated him in reduction of   BMI  From > 30 to 29 with a 14 lb weight loss and encouraged  Continued weight loss with goal of 10% of body weight over the next 6 months using a low glycemic index diet and regular exercise a minimum of 5 days per week.        Folliculitis barbae    Occurring at his occipital hair line,  Where his barber shaved his neck.  Will treat empirically for cog neg staph infection given its failure to resolve with topical antibiotics.  Advised to use Gold Bond powder with zinc once resolved and use Dial soap.          I have discontinued Elliot Dally. Murillo's Glucosamine-Chondroitin. I am also having him start on doxycycline. Additionally, I am having him  maintain his CoQ10, Fish Oil, fluticasone, Misc Natural Products (OSTEO BI-FLEX TRIPLE STRENGTH PO), vitamin C, aspirin EC, Nutritional Supplements (NUTRITIONAL DRINK PO), CINNAMON PO, glucose blood, hydrochlorothiazide, amLODipine, sitaGLIPtin, Turmeric, and simvastatin.  Meds ordered this encounter  Medications  . simvastatin (ZOCOR) 40 MG tablet    Sig: TAKE 1 BY MOUTH DAILY AT 6PM    Dispense:  90 tablet    Refill:  1  . doxycycline (VIBRA-TABS) 100 MG tablet    Sig: Take 1 tablet (100 mg total) by mouth 2 (two) times daily.    Dispense:  14 tablet    Refill:  0    Medications Discontinued During This Encounter  Medication Reason  . Glucosamine-Chondroit-Vit C-Mn (GLUCOSAMINE-CHONDROITIN) CAPS Patient has not taken in last 30 days  . simvastatin (ZOCOR) 40 MG tablet Reorder    Follow-up: Return in about 6 months (around 01/09/2019) for follow up diabetes.   Crecencio Mc, MD

## 2018-07-11 DIAGNOSIS — L738 Other specified follicular disorders: Secondary | ICD-10-CM | POA: Insufficient documentation

## 2018-07-11 NOTE — Assessment & Plan Note (Signed)
Occurring at his occipital hair line,  Where his barber shaved his neck.  Will treat empirically for cog neg staph infection given its failure to resolve with topical antibiotics.  Advised to use Gold Bond powder with zinc once resolved and use Dial soap.

## 2018-07-11 NOTE — Assessment & Plan Note (Signed)
I have congratulated him in reduction of   BMI  From > 30 to 29 with a 14 lb weight loss and encouraged  Continued weight loss with goal of 10% of body weight over the next 6 months using a low glycemic index diet and regular exercise a minimum of 5 days per week.

## 2018-07-11 NOTE — Assessment & Plan Note (Signed)
Well controlled for age on current regimen. Renal function stable, no changes today.  Lab Results  Component Value Date   CREATININE 1.06 07/09/2018   Lab Results  Component Value Date   NA 141 07/09/2018   K 4.5 07/09/2018   CL 105 07/09/2018   CO2 30 07/09/2018

## 2018-07-11 NOTE — Assessment & Plan Note (Signed)
LDL and triglycerides remain at goal on current medications without side effects or  liver enzymes elevation.  no changes  today   Lab Results  Component Value Date   CHOL 105 07/09/2018   HDL 45.70 07/09/2018   LDLCALC 51 07/09/2018   LDLDIRECT 60.0 09/29/2017   TRIG 38.0 07/09/2018   CHOLHDL 2 07/09/2018   Lab Results  Component Value Date   ALT 27 07/09/2018   AST 24 07/09/2018   ALKPHOS 44 07/09/2018   BILITOT 0.8 07/09/2018

## 2018-07-11 NOTE — Assessment & Plan Note (Signed)
Currently well-controlled on current medications .  hemoglobin A1c is at goal of less than 7.0 . Patient is reminded to schedule an annual eye exam and foot exam is normal today. Patient has no microalbuminuria. Patient is tolerating statin therapy for CAD risk reduction and on ACE/ARB for renal protection and hypertension   Lab Results  Component Value Date   HGBA1C 6.1 (A) 07/09/2018   Lab Results  Component Value Date   MICROALBUR 1.3 03/30/2018

## 2018-07-23 DIAGNOSIS — M9903 Segmental and somatic dysfunction of lumbar region: Secondary | ICD-10-CM | POA: Diagnosis not present

## 2018-07-23 DIAGNOSIS — M955 Acquired deformity of pelvis: Secondary | ICD-10-CM | POA: Diagnosis not present

## 2018-07-23 DIAGNOSIS — M531 Cervicobrachial syndrome: Secondary | ICD-10-CM | POA: Diagnosis not present

## 2018-07-23 DIAGNOSIS — M5136 Other intervertebral disc degeneration, lumbar region: Secondary | ICD-10-CM | POA: Diagnosis not present

## 2018-07-23 DIAGNOSIS — M9905 Segmental and somatic dysfunction of pelvic region: Secondary | ICD-10-CM | POA: Diagnosis not present

## 2018-07-23 DIAGNOSIS — M9901 Segmental and somatic dysfunction of cervical region: Secondary | ICD-10-CM | POA: Diagnosis not present

## 2018-07-27 MED ORDER — DOXYCYCLINE HYCLATE 100 MG PO TABS
100.0000 mg | ORAL_TABLET | Freq: Two times a day (BID) | ORAL | 0 refills | Status: DC
Start: 2018-07-27 — End: 2018-09-30

## 2018-09-02 DIAGNOSIS — M9903 Segmental and somatic dysfunction of lumbar region: Secondary | ICD-10-CM | POA: Diagnosis not present

## 2018-09-02 DIAGNOSIS — M955 Acquired deformity of pelvis: Secondary | ICD-10-CM | POA: Diagnosis not present

## 2018-09-02 DIAGNOSIS — M9901 Segmental and somatic dysfunction of cervical region: Secondary | ICD-10-CM | POA: Diagnosis not present

## 2018-09-02 DIAGNOSIS — M9905 Segmental and somatic dysfunction of pelvic region: Secondary | ICD-10-CM | POA: Diagnosis not present

## 2018-09-02 DIAGNOSIS — M531 Cervicobrachial syndrome: Secondary | ICD-10-CM | POA: Diagnosis not present

## 2018-09-02 DIAGNOSIS — M5136 Other intervertebral disc degeneration, lumbar region: Secondary | ICD-10-CM | POA: Diagnosis not present

## 2018-09-14 ENCOUNTER — Other Ambulatory Visit: Payer: Self-pay | Admitting: Internal Medicine

## 2018-09-14 DIAGNOSIS — I10 Essential (primary) hypertension: Secondary | ICD-10-CM

## 2018-09-30 ENCOUNTER — Ambulatory Visit (INDEPENDENT_AMBULATORY_CARE_PROVIDER_SITE_OTHER): Payer: Medicare Other | Admitting: Internal Medicine

## 2018-09-30 ENCOUNTER — Ambulatory Visit: Payer: Medicare Other | Admitting: Internal Medicine

## 2018-09-30 ENCOUNTER — Encounter: Payer: Self-pay | Admitting: Internal Medicine

## 2018-09-30 VITALS — BP 134/74 | HR 44 | Temp 97.7°F | Resp 14 | Ht 70.0 in | Wt 194.2 lb

## 2018-09-30 DIAGNOSIS — E1169 Type 2 diabetes mellitus with other specified complication: Secondary | ICD-10-CM

## 2018-09-30 DIAGNOSIS — E875 Hyperkalemia: Secondary | ICD-10-CM

## 2018-09-30 DIAGNOSIS — L738 Other specified follicular disorders: Secondary | ICD-10-CM | POA: Diagnosis not present

## 2018-09-30 DIAGNOSIS — E1129 Type 2 diabetes mellitus with other diabetic kidney complication: Secondary | ICD-10-CM

## 2018-09-30 DIAGNOSIS — E785 Hyperlipidemia, unspecified: Secondary | ICD-10-CM | POA: Diagnosis not present

## 2018-09-30 DIAGNOSIS — R809 Proteinuria, unspecified: Secondary | ICD-10-CM

## 2018-09-30 DIAGNOSIS — Z23 Encounter for immunization: Secondary | ICD-10-CM | POA: Diagnosis not present

## 2018-09-30 LAB — COMPREHENSIVE METABOLIC PANEL
ALT: 31 U/L (ref 0–53)
AST: 24 U/L (ref 0–37)
Albumin: 4.5 g/dL (ref 3.5–5.2)
Alkaline Phosphatase: 49 U/L (ref 39–117)
BILIRUBIN TOTAL: 0.9 mg/dL (ref 0.2–1.2)
BUN: 15 mg/dL (ref 6–23)
CHLORIDE: 105 meq/L (ref 96–112)
CO2: 32 meq/L (ref 19–32)
Calcium: 9.7 mg/dL (ref 8.4–10.5)
Creatinine, Ser: 1.02 mg/dL (ref 0.40–1.50)
GFR: 75.74 mL/min (ref >60.00)
GLUCOSE: 126 mg/dL — AB (ref 70–99)
Potassium: 4.9 mEq/L (ref 3.5–5.1)
Sodium: 141 mEq/L (ref 135–145)
Total Protein: 7.2 g/dL (ref 6.0–8.3)

## 2018-09-30 LAB — LIPID PANEL
Cholesterol: 106 mg/dL (ref 0–200)
HDL: 44.7 mg/dL (ref >39.00)
LDL Cholesterol: 53 mg/dL (ref 0–99)
NONHDL: 61.09
Total CHOL/HDL Ratio: 2
Triglycerides: 42 mg/dL (ref 0.0–149.0)
VLDL: 8.4 mg/dL (ref 0.0–40.0)

## 2018-09-30 LAB — HEMOGLOBIN A1C: HEMOGLOBIN A1C: 6.1 % (ref 4.6–6.5)

## 2018-09-30 MED ORDER — ZOSTER VAC RECOMB ADJUVANTED 50 MCG/0.5ML IM SUSR: 0.5000 mL | Freq: Once | INTRAMUSCULAR | 1 refills | Status: AC

## 2018-09-30 NOTE — Patient Instructions (Addendum)
Congratulations on losing weight and taking control of your diabetes!  If your a1c is < 7.0,  I'll see you in 6 months  Prevent pre dinner low blood sugars with a protein snack mid afternoon (nuts,  Cheese)  Try the SOLA low carb bread ([employer]  Frozen bread )  3 g/slice  Tastes great!   The ShingRx vaccine is now available in local pharmacies and is much more protective thant Zostavaxs,  It is therefore ADVISED for all interested adults over 50 to prevent shingles

## 2018-09-30 NOTE — Progress Notes (Signed)
Subjective:  Patient ID: Richard Hardy, male    DOB: [birth year]  Age: 72 y.o. MRN: [id number]  CC: The primary encounter diagnosis was Hyperkalemia. Diagnoses of Hyperlipidemia associated with type 2 diabetes mellitus ([place]), Controlled type 2 diabetes mellitus with microalbuminuria, without long-term current use of insulin ([place]), Need for 23-polyvalent pneumococcal polysaccharide vaccine, Encounter for immunization, and Folliculitis barbae were also pertinent to this visit.  HPI Richard Hardy presents for 3 month follow up on diabetes.  Patient has no complaints today.  Patient is following a low glycemic index diet .  He has stopped Januvia secondary to persistent GI upset..  Fasting sugars have been less than 140 most of the time and post prandials have been under 160 except on rare occasions.   He has had several lows in late afternoon around 5 pm .  Patient is exercising about 3 times per week and is intentionally trying to lose weight .  He has lost  22 lbs since Oct 2018,  31 lbs since May 2018.   Patient has had an eye exam in the last 12 months and checks feet regularly for signs of infection.  Patient does not walk barefoot outside,  And denies any numbness tingling or burning in feet. Patient is up to date on all recommended vaccinations    Outpatient Medications Prior to Visit  Medication Sig Dispense Refill  . amLODipine (NORVASC) 5 MG tablet TAKE 1 TABLET BY MOUTH EVERY DAY 90 tablet 3  . aspirin EC 81 MG tablet Take 1 tablet (81 mg total) by mouth daily. 90 tablet 4  . CINNAMON PO Take 2 capsules by mouth daily.    . Coenzyme Q10 (COQ10) 200 MG CAPS Take 1 tablet by mouth daily.      . Fish Oil OIL Take by mouth daily.      . fluticasone (FLONASE) 50 MCG/ACT nasal spray Place into the nose at bedtime.      Marland Kitchen glucose blood (BAYER CONTOUR TEST) test strip Check blood sugar 1-2 times daily . 100 each 12  . hydrochlorothiazide (HYDRODIURIL) 25 MG tablet TAKE 1 TABLET BY MOUTH EVERY DAY  90 tablet 1  . simvastatin (ZOCOR) 40 MG tablet TAKE 1 BY MOUTH DAILY AT 6PM 90 tablet 1  . Turmeric 500 MG TABS Take 1 tablet by mouth daily.    . vitamin C (ASCORBIC ACID) 500 MG tablet Take 500 mg by mouth daily.    Marland Kitchen doxycycline (VIBRA-TABS) 100 MG tablet Take 1 tablet (100 mg total) by mouth 2 (two) times daily. (Patient not taking: Reported on 09/30/2018) 14 tablet 0  . Misc Natural Products (OSTEO BI-FLEX TRIPLE STRENGTH PO) Take by mouth daily.    . Nutritional Supplements (NUTRITIONAL DRINK PO) Take 2 packets by mouth daily.    . sitaGLIPtin (JANUVIA) 50 MG tablet Take 1 tablet (50 mg total) by mouth daily. (Patient not taking: Reported on 09/30/2018) 90 tablet 1   No facility-administered medications prior to visit.     Review of Systems;  Patient denies headache, fevers, malaise, unintentional weight loss, skin rash, eye pain, sinus congestion and sinus pain, sore throat, dysphagia,  hemoptysis , cough, dyspnea, wheezing, chest pain, palpitations, orthopnea, edema, abdominal pain, nausea, melena, diarrhea, constipation, flank pain, dysuria, hematuria, urinary  Frequency, nocturia, numbness, tingling, seizures,  Focal weakness, Loss of consciousness,  Tremor, insomnia, depression, anxiety, and suicidal ideation.      Objective:  BP 134/74 (BP Location: Left Arm, Patient Position: Sitting, Cuff  Size: Normal)   Pulse (!) 44   Temp 97.7 F (36.5 C) (Oral)   Resp 14   Ht 5\' 10"  (1.778 m)   Wt 194 lb 3.2 oz (88.1 kg)   SpO2 98%   BMI 27.86 kg/m   BP Readings from Last 3 Encounters:  09/30/18 134/74  07/09/18 130/76  06/22/18 120/70    Wt Readings from Last 3 Encounters:  09/30/18 194 lb 3.2 oz (88.1 kg)  07/09/18 202 lb 12.8 oz (92 kg)  06/22/18 208 lb 12 oz (94.7 kg)    General appearance: alert, cooperative and appears stated age Ears: normal TM's and external ear canals both ears Throat: lips, mucosa, and tongue normal; teeth and gums normal Neck: no adenopathy,  no carotid bruit, supple, symmetrical, trachea midline and thyroid not enlarged, symmetric, no tenderness/mass/nodules Back: symmetric, no curvature. ROM normal. No CVA tenderness. Lungs: clear to auscultation bilaterally Heart: regular rate and rhythm, S1, S2 normal, no murmur, click, rub or gallop Abdomen: soft, non-tender; bowel sounds normal; no masses,  no organomegaly Pulses: 2+ and symmetric Skin: Skin color, texture, turgor normal. No rashes or lesions Lymph nodes: Cervical, supraclavicular, and axillary nodes normal.  Lab Results  Component Value Date   HGBA1C 6.1 09/30/2018   HGBA1C 6.1 (A) 07/09/2018   HGBA1C 7.9 (H) 03/30/2018    Lab Results  Component Value Date   CREATININE 1.02 09/30/2018   CREATININE 1.06 07/09/2018   CREATININE 0.99 03/30/2018    Lab Results  Component Value Date   WBC 5.7 08/29/2015   HGB 15.6 08/29/2015   HCT 45.3 08/29/2015   PLT 167.0 08/29/2015   GLUCOSE 126 (H) 09/30/2018   CHOL 106 09/30/2018   TRIG 42.0 09/30/2018   HDL 44.70 09/30/2018   LDLDIRECT 60.0 09/29/2017   LDLCALC 53 09/30/2018   ALT 31 09/30/2018   AST 24 09/30/2018   NA 141 09/30/2018   K 4.9 09/30/2018   CL 105 09/30/2018   CREATININE 1.02 09/30/2018   BUN 15 09/30/2018   CO2 32 09/30/2018   TSH 2.45 08/12/2007   PSA 0.19 03/30/2018   HGBA1C 6.1 09/30/2018   MICROALBUR 1.3 03/30/2018    No results found.  Assessment & Plan:   Problem List Items Addressed This Visit    Controlled diabetes mellitus with microalbuminuria ([place])    Not tolerating Januvia due  to nausea.  Blood sugars are well controlled on low glycemic index diet.  Addressed late afternoon recurrent hypoglycemia with a protein snack midday . He is tolerating asa and statin .  He has a documented intolerance and C/I to ACE I and ARB therapy and his microalbuminuria has resolved .  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 Improves  But does not resolve with anti MRSA treatment (doxycyclien 100 mg bid x 7 days).  Reviewed use of antibacterial soaps.  Will discuss at upcoming dermatology appt.       Hyperkalemia - Primary   Relevant Orders   Comprehensive metabolic panel (Completed)   Hyperlipidemia associated with type 2 diabetes mellitus ([facility])    LDL and triglycerides remain at goal on current medications without side effects or  liver enzymes elevation.  no changes  today   Lab  Results  Component Value Date   CHOL 106 09/30/2018   HDL 44.70 09/30/2018   LDLCALC 53 09/30/2018   LDLDIRECT 60.0 09/29/2017   TRIG 42.0 09/30/2018   CHOLHDL 2 09/30/2018   Lab Results  Component Value Date   ALT 31 09/30/2018   AST 24 09/30/2018   ALKPHOS 49 09/30/2018   BILITOT 0.9 09/30/2018          Relevant Orders   Lipid panel (Completed)    Other Visit Diagnoses    Need for 23-polyvalent pneumococcal polysaccharide vaccine       Relevant Orders   Pneumococcal polysaccharide vaccine 23-valent greater than or equal to 2yo subcutaneous/IM (Completed)   Encounter for immunization       Relevant Orders   Flu vaccine HIGH DOSE PF (Completed)      I have discontinued Elliot Dally. [place]'s Misc Natural Products (OSTEO BI-FLEX TRIPLE STRENGTH PO), Nutritional Supplements (NUTRITIONAL DRINK PO), sitaGLIPtin, and doxycycline. I am also having him start on Zoster Vaccine Adjuvanted. Additionally, I am having him maintain his CoQ10, Fish Oil, fluticasone, vitamin C, aspirin EC, CINNAMON PO, glucose blood, amLODipine, Turmeric, simvastatin, and hydrochlorothiazide.  Meds ordered this encounter  Medications  . Zoster Vaccine Adjuvanted [facility]) injection    Sig: Inject 0.5 mLs into the muscle once for 1 dose.    Dispense:  1 each    Refill:  1    Medications Discontinued During  This Encounter  Medication Reason  . doxycycline (VIBRA-TABS) 100 MG tablet Completed Course  . Misc Natural Products (OSTEO BI-FLEX TRIPLE STRENGTH PO) Error  . Nutritional Supplements (NUTRITIONAL DRINK PO) Patient Preference  . sitaGLIPtin (JANUVIA) 50 MG tablet     Follow-up: Return in about 6 months (around 04/01/2019) for follow up diabetes.   Crecencio Mc, MD

## 2018-10-03 NOTE — Assessment & Plan Note (Addendum)
Not tolerating Januvia due  to nausea.  Blood sugars are well controlled on low glycemic index diet.  Addressed late afternoon recurrent hypoglycemia with a protein snack midday . He is tolerating asa and statin .  He has a documented intolerance and C/I to ACE I and ARB therapy and his microalbuminuria has resolved .  Lab Results  Component Value Date   HGBA1C 6.1 09/30/2018   Lab Results  Component Value Date   MICROALBUR 1.3 03/30/2018

## 2018-10-03 NOTE — Assessment & Plan Note (Addendum)
Recurrent occurring at the nape of his neck. Improves  But does not resolve with anti MRSA treatment (doxycyclien 100 mg bid x 7 days).  Reviewed use of antibacterial soaps.  Will discuss at upcoming dermatology appt.

## 2018-10-03 NOTE — Assessment & Plan Note (Signed)
LDL and triglycerides remain at goal on current medications without side effects or  liver enzymes elevation.  no changes  today   Lab Results  Component Value Date   CHOL 106 09/30/2018   HDL 44.70 09/30/2018   LDLCALC 53 09/30/2018   LDLDIRECT 60.0 09/29/2017   TRIG 42.0 09/30/2018   CHOLHDL 2 09/30/2018   Lab Results  Component Value Date   ALT 31 09/30/2018   AST 24 09/30/2018   ALKPHOS 49 09/30/2018   BILITOT 0.9 09/30/2018

## 2018-10-14 DIAGNOSIS — M5136 Other intervertebral disc degeneration, lumbar region: Secondary | ICD-10-CM | POA: Diagnosis not present

## 2018-10-14 DIAGNOSIS — M955 Acquired deformity of pelvis: Secondary | ICD-10-CM | POA: Diagnosis not present

## 2018-10-14 DIAGNOSIS — M9901 Segmental and somatic dysfunction of cervical region: Secondary | ICD-10-CM | POA: Diagnosis not present

## 2018-10-14 DIAGNOSIS — M9905 Segmental and somatic dysfunction of pelvic region: Secondary | ICD-10-CM | POA: Diagnosis not present

## 2018-10-14 DIAGNOSIS — M531 Cervicobrachial syndrome: Secondary | ICD-10-CM | POA: Diagnosis not present

## 2018-10-14 DIAGNOSIS — M9903 Segmental and somatic dysfunction of lumbar region: Secondary | ICD-10-CM | POA: Diagnosis not present

## 2018-11-02 DIAGNOSIS — E119 Type 2 diabetes mellitus without complications: Secondary | ICD-10-CM

## 2018-11-03 ENCOUNTER — Other Ambulatory Visit: Payer: Self-pay | Admitting: Internal Medicine

## 2018-11-03 DIAGNOSIS — E119 Type 2 diabetes mellitus without complications: Secondary | ICD-10-CM

## 2018-11-03 MED ORDER — GLUCOSE BLOOD VI STRP: ORAL_STRIP | 12 refills | Status: DC

## 2018-11-12 DIAGNOSIS — I8310 Varicose veins of unspecified lower extremity with inflammation: Secondary | ICD-10-CM | POA: Diagnosis not present

## 2018-11-12 DIAGNOSIS — L812 Freckles: Secondary | ICD-10-CM | POA: Diagnosis not present

## 2018-11-12 DIAGNOSIS — L570 Actinic keratosis: Secondary | ICD-10-CM | POA: Diagnosis not present

## 2018-11-12 DIAGNOSIS — L738 Other specified follicular disorders: Secondary | ICD-10-CM | POA: Diagnosis not present

## 2018-11-12 DIAGNOSIS — L821 Other seborrheic keratosis: Secondary | ICD-10-CM | POA: Diagnosis not present

## 2018-11-12 DIAGNOSIS — L578 Other skin changes due to chronic exposure to nonionizing radiation: Secondary | ICD-10-CM | POA: Diagnosis not present

## 2018-11-12 DIAGNOSIS — Z1283 Encounter for screening for malignant neoplasm of skin: Secondary | ICD-10-CM | POA: Diagnosis not present

## 2018-11-12 DIAGNOSIS — D225 Melanocytic nevi of trunk: Secondary | ICD-10-CM | POA: Diagnosis not present

## 2018-11-18 DIAGNOSIS — M955 Acquired deformity of pelvis: Secondary | ICD-10-CM | POA: Diagnosis not present

## 2018-11-18 DIAGNOSIS — M5136 Other intervertebral disc degeneration, lumbar region: Secondary | ICD-10-CM | POA: Diagnosis not present

## 2018-11-18 DIAGNOSIS — M531 Cervicobrachial syndrome: Secondary | ICD-10-CM | POA: Diagnosis not present

## 2018-11-18 DIAGNOSIS — M9903 Segmental and somatic dysfunction of lumbar region: Secondary | ICD-10-CM | POA: Diagnosis not present

## 2018-11-18 DIAGNOSIS — M9901 Segmental and somatic dysfunction of cervical region: Secondary | ICD-10-CM | POA: Diagnosis not present

## 2018-11-18 DIAGNOSIS — M9905 Segmental and somatic dysfunction of pelvic region: Secondary | ICD-10-CM | POA: Diagnosis not present

## 2018-12-15 DIAGNOSIS — R69 Illness, unspecified: Secondary | ICD-10-CM | POA: Diagnosis not present

## 2018-12-16 DIAGNOSIS — M9903 Segmental and somatic dysfunction of lumbar region: Secondary | ICD-10-CM | POA: Diagnosis not present

## 2018-12-16 DIAGNOSIS — M531 Cervicobrachial syndrome: Secondary | ICD-10-CM | POA: Diagnosis not present

## 2018-12-16 DIAGNOSIS — M5442 Lumbago with sciatica, left side: Secondary | ICD-10-CM | POA: Diagnosis not present

## 2018-12-16 DIAGNOSIS — M9905 Segmental and somatic dysfunction of pelvic region: Secondary | ICD-10-CM | POA: Diagnosis not present

## 2018-12-16 DIAGNOSIS — M9901 Segmental and somatic dysfunction of cervical region: Secondary | ICD-10-CM | POA: Diagnosis not present

## 2018-12-16 DIAGNOSIS — M955 Acquired deformity of pelvis: Secondary | ICD-10-CM | POA: Diagnosis not present

## 2018-12-29 ENCOUNTER — Other Ambulatory Visit: Payer: Self-pay | Admitting: Internal Medicine

## 2019-01-05 ENCOUNTER — Other Ambulatory Visit: Payer: Self-pay

## 2019-01-05 DIAGNOSIS — E119 Type 2 diabetes mellitus without complications: Secondary | ICD-10-CM

## 2019-01-05 DIAGNOSIS — R69 Illness, unspecified: Secondary | ICD-10-CM | POA: Diagnosis not present

## 2019-01-05 MED ORDER — ONETOUCH VERIO IQ SYSTEM W/DEVICE KIT: PACK | 0 refills | Status: AC

## 2019-01-05 MED ORDER — ONETOUCH DELICA LANCETS 33G MISC: 1 refills | Status: AC

## 2019-01-05 MED ORDER — GLUCOSE BLOOD VI STRP: ORAL_STRIP | 1 refills | Status: DC

## 2019-01-06 ENCOUNTER — Telehealth: Payer: Self-pay

## 2019-01-06 ENCOUNTER — Other Ambulatory Visit (INDEPENDENT_AMBULATORY_CARE_PROVIDER_SITE_OTHER): Payer: Medicare HMO

## 2019-01-06 DIAGNOSIS — E1129 Type 2 diabetes mellitus with other diabetic kidney complication: Secondary | ICD-10-CM | POA: Diagnosis not present

## 2019-01-06 DIAGNOSIS — E1169 Type 2 diabetes mellitus with other specified complication: Secondary | ICD-10-CM | POA: Diagnosis not present

## 2019-01-06 DIAGNOSIS — R809 Proteinuria, unspecified: Secondary | ICD-10-CM | POA: Diagnosis not present

## 2019-01-06 DIAGNOSIS — E785 Hyperlipidemia, unspecified: Secondary | ICD-10-CM

## 2019-01-06 LAB — COMPREHENSIVE METABOLIC PANEL
ALT: 26 U/L (ref 0–53)
AST: 23 U/L (ref 0–37)
Albumin: 4.2 g/dL (ref 3.5–5.2)
Alkaline Phosphatase: 48 U/L (ref 39–117)
BUN: 20 mg/dL (ref 6–23)
CALCIUM: 9.4 mg/dL (ref 8.4–10.5)
CO2: 31 meq/L (ref 19–32)
Chloride: 104 mEq/L (ref 96–112)
Creatinine, Ser: 1.08 mg/dL (ref 0.40–1.50)
GFR: 66.66 mL/min (ref >60.00)
GLUCOSE: 122 mg/dL — AB (ref 70–99)
Potassium: 4.5 mEq/L (ref 3.5–5.1)
Sodium: 140 mEq/L (ref 135–145)
Total Bilirubin: 0.8 mg/dL (ref 0.2–1.2)
Total Protein: 6.5 g/dL (ref 6.0–8.3)

## 2019-01-06 LAB — LIPID PANEL
CHOL/HDL RATIO: 3
Cholesterol: 103 mg/dL (ref 0–200)
HDL: 40.7 mg/dL (ref >39.00)
LDL CALC: 50 mg/dL (ref 0–99)
NonHDL: 61.98
TRIGLYCERIDES: 59 mg/dL (ref 0.0–149.0)
VLDL: 11.8 mg/dL (ref 0.0–40.0)

## 2019-01-06 LAB — HEMOGLOBIN A1C: HEMOGLOBIN A1C: 6.4 % (ref 4.6–6.5)

## 2019-01-06 NOTE — Telephone Encounter (Signed)
Labs ordered for lab appt.  

## 2019-01-07 DIAGNOSIS — R69 Illness, unspecified: Secondary | ICD-10-CM | POA: Diagnosis not present

## 2019-01-11 ENCOUNTER — Encounter: Payer: Self-pay | Admitting: Internal Medicine

## 2019-01-11 ENCOUNTER — Ambulatory Visit (INDEPENDENT_AMBULATORY_CARE_PROVIDER_SITE_OTHER): Payer: Medicare HMO | Admitting: Internal Medicine

## 2019-01-11 VITALS — BP 118/70 | HR 48 | Temp 97.8°F | Resp 15 | Ht 70.0 in | Wt 193.0 lb

## 2019-01-11 DIAGNOSIS — E1129 Type 2 diabetes mellitus with other diabetic kidney complication: Secondary | ICD-10-CM

## 2019-01-11 DIAGNOSIS — R809 Proteinuria, unspecified: Secondary | ICD-10-CM

## 2019-01-11 DIAGNOSIS — E785 Hyperlipidemia, unspecified: Secondary | ICD-10-CM

## 2019-01-11 DIAGNOSIS — I10 Essential (primary) hypertension: Secondary | ICD-10-CM

## 2019-01-11 DIAGNOSIS — E663 Overweight: Secondary | ICD-10-CM | POA: Diagnosis not present

## 2019-01-11 DIAGNOSIS — E1169 Type 2 diabetes mellitus with other specified complication: Secondary | ICD-10-CM

## 2019-01-11 DIAGNOSIS — R001 Bradycardia, unspecified: Secondary | ICD-10-CM | POA: Diagnosis not present

## 2019-01-11 NOTE — Assessment & Plan Note (Signed)
Did not tolerate Januvia due  to nausea.  Blood sugars remain  well controlled on low glycemic index diet.  Addressed late afternoon recurrent hypoglycemia with a protein snack midday . He is tolerating asa and statin .  He has a documented intolerance and C/I to ACE I and ARB therapy and his microalbuminuria has resolved .  Lab Results  Component Value Date   HGBA1C 6.4 01/06/2019   Lab Results  Component Value Date   MICROALBUR 1.3 03/30/2018

## 2019-01-11 NOTE — Assessment & Plan Note (Signed)
Well controlled on current regimen History of hyperkalemia on A Renal function stable, no changes today.  Lab Results  Component Value Date   CREATININE 1.08 01/06/2019   Lab Results  Component Value Date   NA 140 01/06/2019   K 4.5 01/06/2019   CL 104 01/06/2019   CO2 31 01/06/2019

## 2019-01-11 NOTE — Assessment & Plan Note (Signed)
Asymptomatic. Exercises without feeling light headed

## 2019-01-11 NOTE — Patient Instructions (Addendum)
Your diabetes remains under excellent control  On diet alone,   And your cholesterol and other labs are also normal. Please continue your current medications. return in 6 months for follow up on diabetes    Don't forget you can use Beano and Gas X  Daily for the "healthy diet side effects"      To make a low carb chip :  Take the Joseph's Lavash or Pita bread,  Or the [place] Low carb whole wheat tortilla   Place on metal cookie sheet  Brush with olive oil  Sprinkle garlic powder (NOT garlic salt), grated parmesan cheese, mediterranean seasoning , or all of them?  Bake at 275 for 30 minutes   We have substitutions for your potatoes!!  Try the mashed cauliflower and riced cauliflower dishes instead of rice and mashed potatoes  Mashed turnips are also very low carb!   For desserts :  Try the Dannon Lt n Fit greek yogurt dessert flavors and top with reddi Whip .  8 carbs,  80 calories  Try Oikos Triple Zero [place] Yogurt in the salted caramel, and the coffee flavors  With Whipped Cream for dessert  breyer's low carb ice cream, available in bars (on a stick, better ) or scoopable ice cream  HERE ARE THE LOW CARB  BREAD CHOICES

## 2019-01-11 NOTE — Assessment & Plan Note (Signed)
LDL and triglycerides remain at goal on current medications without side effects or  liver enzymes elevation.  no changes  today   Lab Results  Component Value Date   CHOL 103 01/06/2019   HDL 40.70 01/06/2019   LDLCALC 50 01/06/2019   LDLDIRECT 60.0 09/29/2017   TRIG 59.0 01/06/2019   CHOLHDL 3 01/06/2019   Lab Results  Component Value Date   ALT 26 01/06/2019   AST 23 01/06/2019   ALKPHOS 48 01/06/2019   BILITOT 0.8 01/06/2019

## 2019-01-11 NOTE — Assessment & Plan Note (Addendum)
I have congratulated him in continued   reduction of   BMI  Down From > 30 to 29 to 27 and encouraged  adherence to  a low glycemic index diet and regular exercise a minimum of 5 days per week.

## 2019-01-11 NOTE — Progress Notes (Signed)
Subjective:  Patient ID: Richard Hardy, male    DOB: 02-08-1946  Age: 73 y.o. MRN: [id number]  CC: The primary encounter diagnosis was Controlled type 2 diabetes mellitus with microalbuminuria, without long-term current use of insulin ([place]). Diagnoses of Hyperlipidemia associated with type 2 diabetes mellitus ([place]), Bradycardia, Essential hypertension, and Overweight (BMI 25.0-29.9) were also pertinent to this visit.  HPI Richard Hardy presents for follow up on T2DM,  Hyperlipidemia ., and hypertension.   follow up on diabetes.  Patient has no complaints today.  Patient is following a low glycemic index diet about 90% of the time   Fasting sugars have been under 115 except on rare occasions. Patient is exercising about 4 times per week and intentionally trying to lose weight .  Patient has had an eye exam scheduled this week and checks feet regularly for signs of infection.  Patient does not walk barefoot outside,  And denies any numbness tingling or burning in feet. Patient is up to date on all recommended vaccinations  .Hypertension: patient checks blood pressure twice weekly at home.  Readings have been for the most part < 140/80 at rest . Patient is following a reduced salt diet most days and is taking medications as prescribed Outpatient Medications Prior to Visit  Medication Sig Dispense Refill  . amLODipine (NORVASC) 5 MG tablet TAKE 1 TABLET BY MOUTH EVERY DAY 90 tablet 3  . aspirin EC 81 MG tablet Take 1 tablet (81 mg total) by mouth daily. 90 tablet 4  . Blood Glucose Monitoring Suppl (ONETOUCH VERIO IQ SYSTEM) w/Device KIT Use to check blood sugars once daily. 1 kit 0  . CINNAMON PO Take 2 capsules by mouth daily.    . Coenzyme Q10 (COQ10) 200 MG CAPS Take 1 tablet by mouth daily.      . Fish Oil OIL Take by mouth daily.      . fluticasone (FLONASE) 50 MCG/ACT nasal spray Place into the nose at bedtime.      Marland Kitchen glucose blood (ONETOUCH VERIO) test strip Use to check sugars once daily.  100 each 1  . hydrochlorothiazide (HYDRODIURIL) 25 MG tablet TAKE 1 TABLET BY MOUTH EVERY DAY 90 tablet 1  . ONETOUCH DELICA LANCETS 14G MISC Use to check blood sugars once daily. 100 each 1  . simvastatin (ZOCOR) 40 MG tablet TAKE 1 BY MOUTH DAILY AT 6PM 90 tablet 1  . Turmeric 500 MG TABS Take 1 tablet by mouth daily.    . vitamin C (ASCORBIC ACID) 500 MG tablet Take 500 mg by mouth daily.     No facility-administered medications prior to visit.     Review of Systems;  Patient denies headache, fevers, malaise, unintentional weight loss, skin rash, eye pain, sinus congestion and sinus pain, sore throat, dysphagia,  hemoptysis , cough, dyspnea, wheezing, chest pain, palpitations, orthopnea, edema, abdominal pain, nausea, melena, diarrhea, constipation, flank pain, dysuria, hematuria, urinary  Frequency, nocturia, numbness, tingling, seizures,  Focal weakness, Loss of consciousness,  Tremor, insomnia, depression, anxiety, and suicidal ideation.      Objective:  BP 118/70 (BP Location: Left Arm, Patient Position: Sitting, Cuff Size: Normal)   Pulse (!) 48   Temp 97.8 F (36.6 C) (Oral)   Resp 15   Ht _0  (1.778 m)   Wt 193 lb (87.5 kg)   SpO2 95%   BMI 27.69 kg/m   BP Readings from Last 3 Encounters:  01/11/19 118/70  09/30/18 134/74  07/09/18 130/76  Wt Readings from Last 3 Encounters:  01/11/19 193 lb (87.5 kg)  09/30/18 194 lb 3.2 oz (88.1 kg)  07/09/18 202 lb 12.8 oz (92 kg)    General appearance: alert, cooperative and appears stated age Ears: normal TM's and external ear canals both ears Throat: lips, mucosa, and tongue normal; teeth and gums normal Neck: no adenopathy, no carotid bruit, supple, symmetrical, trachea midline and thyroid not enlarged, symmetric, no tenderness/mass/nodules Back: symmetric, no curvature. ROM normal. No CVA tenderness. Lungs: clear to auscultation bilaterally Heart: regular rate and rhythm, S1, S2 normal, no murmur, click, rub or  gallop Abdomen: soft, non-tender; bowel sounds normal; no masses,  no organomegaly Pulses: 2+ and symmetric Skin: Skin color, texture, turgor normal. No rashes or lesions Lymph nodes: Cervical, supraclavicular, and axillary nodes normal.  Lab Results  Component Value Date   HGBA1C 6.4 01/06/2019   HGBA1C 6.1 09/30/2018   HGBA1C 6.1 (A) 07/09/2018    Lab Results  Component Value Date   CREATININE 1.08 01/06/2019   CREATININE 1.02 09/30/2018   CREATININE 1.06 07/09/2018    Lab Results  Component Value Date   WBC 5.7 08/29/2015   HGB 15.6 08/29/2015   HCT 45.3 08/29/2015   PLT 167.0 08/29/2015   GLUCOSE 122 (H) 01/06/2019   CHOL 103 01/06/2019   TRIG 59.0 01/06/2019   HDL 40.70 01/06/2019   LDLDIRECT 60.0 09/29/2017   LDLCALC 50 01/06/2019   ALT 26 01/06/2019   AST 23 01/06/2019   NA 140 01/06/2019   K 4.5 01/06/2019   CL 104 01/06/2019   CREATININE 1.08 01/06/2019   BUN 20 01/06/2019   CO2 31 01/06/2019   TSH 2.45 08/12/2007   PSA 0.19 03/30/2018   HGBA1C 6.4 01/06/2019   MICROALBUR 1.3 03/30/2018    No results found.  Assessment & Plan:   Problem List Items Addressed This Visit    Bradycardia    Asymptomatic. Exercises without feeling light headed        Diabetes mellitus without complication ([place]) - Primary    Did not tolerate Januvia due  to nausea.  Blood sugars remain  well controlled on low glycemic index diet.  Addressed late afternoon recurrent hypoglycemia with a protein snack midday . He is tolerating asa and statin .  He has a documented intolerance and C/I to ACE I and ARB therapy and his microalbuminuria has resolved .  Lab Results  Component Value Date   HGBA1C 6.4 01/06/2019   Lab Results  Component Value Date   MICROALBUR 1.3 03/30/2018        Hyperlipidemia associated with type 2 diabetes mellitus ([facility])    LDL and triglycerides remain at goal on current medications without side effects or  liver enzymes elevation.  no changes  today     Lab Results  Component Value Date   CHOL 103 01/06/2019   HDL 40.70 01/06/2019   LDLCALC 50 01/06/2019   LDLDIRECT 60.0 09/29/2017   TRIG 59.0 01/06/2019   CHOLHDL 3 01/06/2019   Lab Results  Component Value Date   ALT 26 01/06/2019   AST 23 01/06/2019   ALKPHOS 48 01/06/2019   BILITOT 0.8 01/06/2019          Relevant Orders   Lipid panel   Hypertension    Well controlled on current regimen History of hyperkalemia on A Renal function stable, no changes today.  Lab Results  Component Value Date   CREATININE 1.08 01/06/2019   Lab Results  Component Value  Date   NA 140 01/06/2019   K 4.5 01/06/2019   CL 104 01/06/2019   CO2 31 01/06/2019         Overweight (BMI 25.0-29.9)    I have congratulated him in continued   reduction of   BMI  Down From > 30 to 29 to 27 and encouraged  adherence to  a low glycemic index diet and regular exercise a minimum of 5 days per week.         A total of 25 minutes of face to face time was spent with patient more than half of which was spent in counselling about the above mentioned conditions  and coordination of care   I am having Elliot Dally. [place] maintain his CoQ10, Fish Oil, fluticasone, vitamin C, aspirin EC, CINNAMON PO, amLODipine, Turmeric, hydrochlorothiazide, simvastatin, ONETOUCH VERIO IQ SYSTEM, glucose blood, and ONETOUCH DELICA LANCETS 73U.  No orders of the defined types were placed in this encounter.   There are no discontinued medications.  Follow-up: Return in about 6 months (around 07/12/2019) for follow up diabetes.   Crecencio Mc, MD

## 2019-01-14 DIAGNOSIS — E119 Type 2 diabetes mellitus without complications: Secondary | ICD-10-CM | POA: Diagnosis not present

## 2019-01-14 LAB — HM DIABETES EYE EXAM

## 2019-01-27 DIAGNOSIS — M955 Acquired deformity of pelvis: Secondary | ICD-10-CM | POA: Diagnosis not present

## 2019-01-27 DIAGNOSIS — M9903 Segmental and somatic dysfunction of lumbar region: Secondary | ICD-10-CM | POA: Diagnosis not present

## 2019-01-27 DIAGNOSIS — M531 Cervicobrachial syndrome: Secondary | ICD-10-CM | POA: Diagnosis not present

## 2019-01-27 DIAGNOSIS — M5442 Lumbago with sciatica, left side: Secondary | ICD-10-CM | POA: Diagnosis not present

## 2019-01-27 DIAGNOSIS — M9905 Segmental and somatic dysfunction of pelvic region: Secondary | ICD-10-CM | POA: Diagnosis not present

## 2019-01-27 DIAGNOSIS — M9901 Segmental and somatic dysfunction of cervical region: Secondary | ICD-10-CM | POA: Diagnosis not present

## 2019-03-16 DIAGNOSIS — M531 Cervicobrachial syndrome: Secondary | ICD-10-CM | POA: Diagnosis not present

## 2019-03-16 DIAGNOSIS — M9905 Segmental and somatic dysfunction of pelvic region: Secondary | ICD-10-CM | POA: Diagnosis not present

## 2019-03-16 DIAGNOSIS — M5442 Lumbago with sciatica, left side: Secondary | ICD-10-CM | POA: Diagnosis not present

## 2019-03-16 DIAGNOSIS — M9903 Segmental and somatic dysfunction of lumbar region: Secondary | ICD-10-CM | POA: Diagnosis not present

## 2019-03-16 DIAGNOSIS — M955 Acquired deformity of pelvis: Secondary | ICD-10-CM | POA: Diagnosis not present

## 2019-03-16 DIAGNOSIS — M9901 Segmental and somatic dysfunction of cervical region: Secondary | ICD-10-CM | POA: Diagnosis not present

## 2019-03-17 DIAGNOSIS — G4733 Obstructive sleep apnea (adult) (pediatric): Secondary | ICD-10-CM | POA: Diagnosis not present

## 2019-03-17 DIAGNOSIS — J301 Allergic rhinitis due to pollen: Secondary | ICD-10-CM | POA: Diagnosis not present

## 2019-03-18 DIAGNOSIS — G4733 Obstructive sleep apnea (adult) (pediatric): Secondary | ICD-10-CM | POA: Diagnosis not present

## 2019-03-23 ENCOUNTER — Other Ambulatory Visit: Payer: Self-pay | Admitting: Internal Medicine

## 2019-03-23 DIAGNOSIS — I10 Essential (primary) hypertension: Secondary | ICD-10-CM

## 2019-04-01 ENCOUNTER — Ambulatory Visit: Payer: Medicare Other | Admitting: Internal Medicine

## 2019-04-03 DIAGNOSIS — R69 Illness, unspecified: Secondary | ICD-10-CM | POA: Diagnosis not present

## 2019-04-23 NOTE — Telephone Encounter (Signed)
error 

## 2019-04-27 DIAGNOSIS — M531 Cervicobrachial syndrome: Secondary | ICD-10-CM | POA: Diagnosis not present

## 2019-04-27 DIAGNOSIS — M9901 Segmental and somatic dysfunction of cervical region: Secondary | ICD-10-CM | POA: Diagnosis not present

## 2019-04-27 DIAGNOSIS — M955 Acquired deformity of pelvis: Secondary | ICD-10-CM | POA: Diagnosis not present

## 2019-04-27 DIAGNOSIS — M9903 Segmental and somatic dysfunction of lumbar region: Secondary | ICD-10-CM | POA: Diagnosis not present

## 2019-04-27 DIAGNOSIS — M9905 Segmental and somatic dysfunction of pelvic region: Secondary | ICD-10-CM | POA: Diagnosis not present

## 2019-04-27 DIAGNOSIS — M5442 Lumbago with sciatica, left side: Secondary | ICD-10-CM | POA: Diagnosis not present

## 2019-05-06 ENCOUNTER — Ambulatory Visit: Payer: Medicare Other | Admitting: Internal Medicine

## 2019-05-06 ENCOUNTER — Ambulatory Visit: Payer: Medicare Other

## 2019-05-07 ENCOUNTER — Other Ambulatory Visit: Payer: Self-pay

## 2019-05-11 ENCOUNTER — Other Ambulatory Visit (INDEPENDENT_AMBULATORY_CARE_PROVIDER_SITE_OTHER): Payer: Medicare HMO

## 2019-05-11 ENCOUNTER — Other Ambulatory Visit: Payer: Self-pay

## 2019-05-11 ENCOUNTER — Ambulatory Visit (INDEPENDENT_AMBULATORY_CARE_PROVIDER_SITE_OTHER): Payer: Medicare HMO | Admitting: Internal Medicine

## 2019-05-11 ENCOUNTER — Encounter: Payer: Self-pay | Admitting: Internal Medicine

## 2019-05-11 VITALS — BP 136/78 | HR 52 | Temp 98.5°F | Resp 15 | Ht 70.0 in | Wt 199.4 lb

## 2019-05-11 DIAGNOSIS — E1169 Type 2 diabetes mellitus with other specified complication: Secondary | ICD-10-CM

## 2019-05-11 DIAGNOSIS — Z125 Encounter for screening for malignant neoplasm of prostate: Secondary | ICD-10-CM | POA: Diagnosis not present

## 2019-05-11 DIAGNOSIS — R809 Proteinuria, unspecified: Secondary | ICD-10-CM

## 2019-05-11 DIAGNOSIS — E785 Hyperlipidemia, unspecified: Secondary | ICD-10-CM

## 2019-05-11 DIAGNOSIS — E1129 Type 2 diabetes mellitus with other diabetic kidney complication: Secondary | ICD-10-CM

## 2019-05-11 DIAGNOSIS — E119 Type 2 diabetes mellitus without complications: Secondary | ICD-10-CM | POA: Diagnosis not present

## 2019-05-11 DIAGNOSIS — I10 Essential (primary) hypertension: Secondary | ICD-10-CM | POA: Diagnosis not present

## 2019-05-11 LAB — LIPID PANEL
Cholesterol: 102 mg/dL (ref 0–200)
HDL: 45.7 mg/dL (ref >39.00)
LDL Cholesterol: 48 mg/dL (ref 0–99)
NonHDL: 55.93
Total CHOL/HDL Ratio: 2
Triglycerides: 39 mg/dL (ref 0.0–149.0)
VLDL: 7.8 mg/dL (ref 0.0–40.0)

## 2019-05-11 LAB — COMPREHENSIVE METABOLIC PANEL
ALT: 28 U/L (ref 0–53)
AST: 22 U/L (ref 0–37)
Albumin: 4.1 g/dL (ref 3.5–5.2)
Alkaline Phosphatase: 52 U/L (ref 39–117)
BUN: 17 mg/dL (ref 6–23)
CO2: 33 mEq/L — ABNORMAL HIGH (ref 19–32)
Calcium: 9.3 mg/dL (ref 8.4–10.5)
Chloride: 103 mEq/L (ref 96–112)
Creatinine, Ser: 0.98 mg/dL (ref 0.40–1.50)
GFR: 74.5 mL/min (ref >60.00)
Glucose, Bld: 124 mg/dL — ABNORMAL HIGH (ref 70–99)
Potassium: 4.7 mEq/L (ref 3.5–5.1)
Sodium: 140 mEq/L (ref 135–145)
Total Bilirubin: 1 mg/dL (ref 0.2–1.2)
Total Protein: 6.4 g/dL (ref 6.0–8.3)

## 2019-05-11 LAB — HEMOGLOBIN A1C: Hgb A1c MFr Bld: 6.5 % (ref 4.6–6.5)

## 2019-05-11 LAB — MICROALBUMIN / CREATININE URINE RATIO
Creatinine,U: 124.1 mg/dL
Microalb Creat Ratio: 0.6 mg/g (ref 0.0–30.0)
Microalb, Ur: 0.8 mg/dL (ref 0.0–1.9)

## 2019-05-11 NOTE — Progress Notes (Signed)
Subjective:  Patient ID: Richard Hardy, male    DOB: [birth year]  Age: 73 y.o. MRN: [id number]  CC: There were no encounter diagnoses.  HPI Richard Hardy presents for follow up on type 2 DM , hyperlipidemia and hypertension   Fasting labs were done today  AND were reviewed with patient   Lab Results  Component Value Date   HGBA1C 6.5 05/11/2019    He feels generally well, is exercising several times per week and checking blood sugars once daily at variable times.  BS have been under 130 fasting and < 150 post prandially.  Denies any recent hypoglyemic events.  Taking his medications as directed. Following a carbohydrate modified diet 6 days per week. Denies numbness, burning and tingling of extremities. Appetite is good.    Foot exam normal except for decrease sensation over callouses   on 5th MT heads,  Hemosiderin deposits noted and discussed .  Exercising regularly golfs and walks the course  Up to date on eye exam  Done in feb by porfilio   .Hypertension: patient checks blood pressure twice weekly at home.  Readings have been for the most part < 140/80 at rest . Patient is following a reduce salt diet most days and is taking medications as prescribed     Outpatient Medications Prior to Visit  Medication Sig Dispense Refill  . amLODipine (NORVASC) 5 MG tablet TAKE 1 TABLET BY MOUTH EVERY DAY 90 tablet 3  . aspirin EC 81 MG tablet Take 1 tablet (81 mg total) by mouth daily. 90 tablet 4  . Blood Glucose Monitoring Suppl (ONETOUCH VERIO IQ SYSTEM) w/Device KIT Use to check blood sugars once daily. 1 kit 0  . CINNAMON PO Take 2 capsules by mouth daily.    . Coenzyme Q10 (COQ10) 200 MG CAPS Take 1 tablet by mouth daily.      . Fish Oil OIL Take by mouth daily.      . fluticasone (FLONASE) 50 MCG/ACT nasal spray Place into the nose at bedtime.      Marland Kitchen glucose blood (ONETOUCH VERIO) test strip Use to check sugars once daily. 100 each 1  . hydrochlorothiazide (HYDRODIURIL) 25 MG  tablet TAKE 1 TABLET BY MOUTH EVERY DAY 90 tablet 1  . ONETOUCH DELICA LANCETS 50P MISC Use to check blood sugars once daily. 100 each 1  . simvastatin (ZOCOR) 40 MG tablet TAKE 1 BY MOUTH DAILY AT 6PM 90 tablet 1  . Turmeric 500 MG TABS Take 1 tablet by mouth daily.    . vitamin C (ASCORBIC ACID) 500 MG tablet Take 500 mg by mouth daily.     No facility-administered medications prior to visit.     Review of Systems;  Patient denies headache, fevers, malaise, unintentional weight loss, skin rash, eye pain, sinus congestion and sinus pain, sore throat, dysphagia,  hemoptysis , cough, dyspnea, wheezing, chest pain, palpitations, orthopnea, edema, abdominal pain, nausea, melena, diarrhea, constipation, flank pain, dysuria, hematuria, urinary  Frequency, nocturia, numbness, tingling, seizures,  Focal weakness, Loss of consciousness,  Tremor, insomnia, depression, anxiety, and suicidal ideation.      Objective:  BP 136/78 (BP Location: Left Arm, Patient Position: Sitting, Cuff Size: Normal)   Pulse (!) 52   Temp 98.5 F (36.9 C) (Oral)   Resp 15   Ht 5' 10"  (1.778 m)   Wt 199 lb 6.4 oz (90.4 kg)   SpO2 97%   BMI 28.61 kg/m   BP Readings from Last 3 Encounters:  05/11/19 136/78  01/11/19 118/70  09/30/18 134/74    Wt Readings from Last 3 Encounters:  05/11/19 199 lb 6.4 oz (90.4 kg)  01/11/19 193 lb (87.5 kg)  09/30/18 194 lb 3.2 oz (88.1 kg)    General appearance: alert, cooperative and appears stated age Ears: normal TM's and external ear canals both ears Throat: lips, mucosa, and tongue normal; teeth and gums normal Neck: no adenopathy, no carotid bruit, supple, symmetrical, trachea midline and thyroid not enlarged, symmetric, no tenderness/mass/nodules Back: symmetric, no curvature. ROM normal. No CVA tenderness. Lungs: clear to auscultation bilaterally Heart: regular rate and rhythm, S1, S2 normal, no murmur, click, rub or gallop Abdomen: soft, non-tender; bowel sounds  normal; no masses,  no organomegaly Pulses: 2+ and symmetric Skin: Skin color, texture, turgor normal. No rashes or lesions Lymph nodes: Cervical, supraclavicular, and axillary nodes normal.  Lab Results  Component Value Date   HGBA1C 6.5 05/11/2019   HGBA1C 6.4 01/06/2019   HGBA1C 6.1 09/30/2018    Lab Results  Component Value Date   CREATININE 0.98 05/11/2019   CREATININE 1.08 01/06/2019   CREATININE 1.02 09/30/2018    Lab Results  Component Value Date   WBC 5.7 08/29/2015   HGB 15.6 08/29/2015   HCT 45.3 08/29/2015   PLT 167.0 08/29/2015   GLUCOSE 124 (H) 05/11/2019   CHOL 102 05/11/2019   TRIG 39.0 05/11/2019   HDL 45.70 05/11/2019   LDLDIRECT 60.0 09/29/2017   LDLCALC 48 05/11/2019   ALT 28 05/11/2019   AST 22 05/11/2019   NA 140 05/11/2019   K 4.7 05/11/2019   CL 103 05/11/2019   CREATININE 0.98 05/11/2019   BUN 17 05/11/2019   CO2 33 (H) 05/11/2019   TSH 2.45 08/12/2007   PSA 0.19 03/30/2018   HGBA1C 6.5 05/11/2019   MICROALBUR 0.8 05/11/2019    No results found.  Assessment & Plan:   Problem List Items Addressed This Visit    None      I am having Elliot Dally. Tschida maintain his CoQ10, Fish Oil, fluticasone, vitamin C, aspirin EC, CINNAMON PO, amLODipine, Turmeric, simvastatin, OneTouch Verio IQ System, glucose blood, OneTouch Delica Lancets 03U, and hydrochlorothiazide.  No orders of the defined types were placed in this encounter.   There are no discontinued medications.  Follow-up: No follow-ups on file.   Crecencio Mc, MD

## 2019-05-11 NOTE — Patient Instructions (Addendum)
Your diabetes remains under excellent control  And your cholesterol and other labs are also excellent.  DRINK MORE WATER !!!  . Please continue your current medications. return in 6 months for  [place] follow up on diabetes .

## 2019-05-12 ENCOUNTER — Telehealth: Payer: Self-pay

## 2019-05-12 NOTE — Assessment & Plan Note (Signed)
LDL and triglycerides remain at goal on current medications without side effects or  liver enzymes elevation.  no changes  today   Lab Results  Component Value Date   CHOL 102 05/11/2019   HDL 45.70 05/11/2019   LDLCALC 48 05/11/2019   LDLDIRECT 60.0 09/29/2017   TRIG 39.0 05/11/2019   CHOLHDL 2 05/11/2019   Lab Results  Component Value Date   ALT 28 05/11/2019   AST 22 05/11/2019   ALKPHOS 52 05/11/2019   BILITOT 1.0 05/11/2019

## 2019-05-12 NOTE — Assessment & Plan Note (Signed)
Currently well-controlled on current medications .  hemoglobin A1c is at goal of less than 7.0 . Patient is reminded to schedule an annual eye exam and foot exam is normal today. Patient has no microalbuminuria. Patient is tolerating statin therapy for CAD risk reduction .   ACE/ARB is contraindicated due to hyperkalemia

## 2019-05-12 NOTE — Assessment & Plan Note (Signed)
Well controlled on current regimen. Renal function stable, no changes today.  Lab Results  Component Value Date   CREATININE 0.98 05/11/2019   Lab Results  Component Value Date   NA 140 05/11/2019   K 4.7 05/11/2019   CL 103 05/11/2019   CO2 33 (H) 05/11/2019

## 2019-05-12 NOTE — Telephone Encounter (Signed)
Call attempted to schedule appointment for patient as requested through My Chart. 05/10/2019-Left voicemail message requesting for patient to call back to schedule. 05/12/2019-Line busy.

## 2019-05-15 ENCOUNTER — Other Ambulatory Visit: Payer: Self-pay | Admitting: Internal Medicine

## 2019-05-19 MED ORDER — AMLODIPINE BESYLATE 5 MG PO TABS: 5.0000 mg | ORAL_TABLET | Freq: Every day | ORAL | 3 refills | Status: DC

## 2019-06-01 DIAGNOSIS — M9905 Segmental and somatic dysfunction of pelvic region: Secondary | ICD-10-CM | POA: Diagnosis not present

## 2019-06-01 DIAGNOSIS — M9901 Segmental and somatic dysfunction of cervical region: Secondary | ICD-10-CM | POA: Diagnosis not present

## 2019-06-01 DIAGNOSIS — M955 Acquired deformity of pelvis: Secondary | ICD-10-CM | POA: Diagnosis not present

## 2019-06-01 DIAGNOSIS — M5442 Lumbago with sciatica, left side: Secondary | ICD-10-CM | POA: Diagnosis not present

## 2019-06-01 DIAGNOSIS — M531 Cervicobrachial syndrome: Secondary | ICD-10-CM | POA: Diagnosis not present

## 2019-06-01 DIAGNOSIS — M9903 Segmental and somatic dysfunction of lumbar region: Secondary | ICD-10-CM | POA: Diagnosis not present

## 2019-06-18 ENCOUNTER — Telehealth: Payer: Self-pay | Admitting: Cardiovascular Disease

## 2019-06-18 NOTE — Telephone Encounter (Signed)
° ° °  COVID-19 Pre-Screening Questions:   In the past 7 to 10 days have you had a cough,  shortness of breath, headache, congestion, fever (100 or greater) body aches, chills, sore throat, or sudden loss of taste or sense of smell? no  Have you been around anyone with known Covid 19. no  Have you been around anyone who is awaiting Covid 19 test results in the past 7 to 10 days? Yes;  Son in law  Have you been around anyone who has been exposed to Covid 19, or has mentioned symptoms of Covid 19 within the past 7 to 10 days? yes  If you have any concerns/questions about symptoms patients report during screening (either on the phone or at threshold). Contact the provider seeing the patient or DOD for further guidance.  If neither are available contact a member of the leadership team.

## 2019-06-21 ENCOUNTER — Ambulatory Visit: Payer: Medicare HMO | Admitting: Cardiovascular Disease

## 2019-06-24 DIAGNOSIS — R69 Illness, unspecified: Secondary | ICD-10-CM | POA: Diagnosis not present

## 2019-07-01 ENCOUNTER — Other Ambulatory Visit: Payer: Self-pay | Admitting: Internal Medicine

## 2019-07-01 DIAGNOSIS — R69 Illness, unspecified: Secondary | ICD-10-CM | POA: Diagnosis not present

## 2019-07-05 DIAGNOSIS — G4733 Obstructive sleep apnea (adult) (pediatric): Secondary | ICD-10-CM | POA: Diagnosis not present

## 2019-07-06 DIAGNOSIS — M9901 Segmental and somatic dysfunction of cervical region: Secondary | ICD-10-CM | POA: Diagnosis not present

## 2019-07-06 DIAGNOSIS — M5442 Lumbago with sciatica, left side: Secondary | ICD-10-CM | POA: Diagnosis not present

## 2019-07-06 DIAGNOSIS — M955 Acquired deformity of pelvis: Secondary | ICD-10-CM | POA: Diagnosis not present

## 2019-07-06 DIAGNOSIS — M9903 Segmental and somatic dysfunction of lumbar region: Secondary | ICD-10-CM | POA: Diagnosis not present

## 2019-07-06 DIAGNOSIS — M531 Cervicobrachial syndrome: Secondary | ICD-10-CM | POA: Diagnosis not present

## 2019-07-06 DIAGNOSIS — M9905 Segmental and somatic dysfunction of pelvic region: Secondary | ICD-10-CM | POA: Diagnosis not present

## 2019-07-07 ENCOUNTER — Other Ambulatory Visit: Payer: Medicare HMO

## 2019-07-08 ENCOUNTER — Telehealth: Payer: Self-pay | Admitting: Cardiovascular Disease

## 2019-07-08 NOTE — Progress Notes (Signed)
Cardiology Office Note    Date:  07/09/2019   ID:  Richard, Hardy 1946/05/15, MRN [id number]  PCP:  Crecencio Mc, MD  Cardiologist:  Ida Rogue, MD  Electrophysiologist:  None   Chief Complaint: Follow up  History of Present Illness:   Richard Hardy is a 73 y.o. male with history of postoperative asymptomatic bradycardia, HTN with ARB-induced hyperkalemia, HLD, DM, thrombocytopenia seen by Dr. Grayland Ormond with recommendation to hold ASA, obesity and OSA on BiPAP who presents for follow up of asymptomatic bradycardia and HLD.  Prior Myoview in 2009 showed no evidence of ischemia or infarct with an EF of 58%. Echo in 2013 for murmur showed an EF of 60-65%, no RWMA, normal diastolic function, no significant valvular abnormalities. He has been followed by Dr. Rockey Situ for HLD and prior asymptomatic bradycardia following cataract surgery. He was last seen in 06/2018 and denied any chest pain or SOB. He was working long hours. Heart rate typically runs in the 40s to 50s bpm.   Patient comes in doing well today.  He denies any chest pain, shortness of breath, palpitations, dizziness, presyncope, syncope.  No lower extremity swelling, abdominal distention, orthopnea, PND, or early satiety.  He notes the more active he is, the better he feels.  He states his mild fatigue is at baseline.  He has not had any falls since he was last seen.  No BRBPR or melena.  Blood pressure remains well controlled.  His weight is down another 6 pounds.  Patient's goal is to get to less than 200 pounds.  He does not have any active cardiac issues or concerns today.  Labs: 05/2019 - SCr 0.98, K+ 4.7, AST/ALT normal, albumin 4.1, TC 102, TG 39, HDL 45, LDL 48, A1c 6.5  Past Medical History:  Diagnosis Date  . Diabetes mellitus without complication ([place]) 3825  . Glucose intolerance (impaired glucose tolerance)   . Hyperlipidemia   . Obesity   . Sleep apnea    On BiPAP, Dr. Richardson Landry  . Thrombocythemia ([place])    Dr. Grayland Ormond    Past Surgical History:  Procedure Laterality Date  . CATARACT EXTRACTION, BILATERAL    . COLONOSCOPY WITH PROPOFOL N/A 06/23/2017   Procedure: COLONOSCOPY WITH PROPOFOL;  Surgeon: Manya Silvas, MD;  Location: [facility] ENDOSCOPY;  Service: Endoscopy;  Laterality: N/A;  . EYE SURGERY    . LITHOTRIPSY     Dr. Bernardo Heater  . NASAL SEPTUM SURGERY      Current Medications: Current Meds  Medication Sig  . amLODipine (NORVASC) 5 MG tablet TAKE 1 TABLET BY MOUTH EVERY DAY  . aspirin EC 81 MG tablet Take 1 tablet (81 mg total) by mouth daily.  . Blood Glucose Monitoring Suppl (ONETOUCH VERIO IQ SYSTEM) w/Device KIT Use to check blood sugars once daily.  Marland Kitchen CINNAMON PO Take 2 capsules by mouth daily.  . Coenzyme Q10 (COQ10) 200 MG CAPS Take 1 tablet by mouth daily.    . Fish Oil OIL Take by mouth daily.    . fluticasone (FLONASE) 50 MCG/ACT nasal spray Place into the nose at bedtime.    . hydrochlorothiazide (HYDRODIURIL) 25 MG tablet TAKE 1 TABLET BY MOUTH EVERY DAY  . ONETOUCH DELICA LANCETS 05L MISC Use to check blood sugars once daily.  Glory Rosebush VERIO test strip USE TO CHECK SUGARS ONCE DAILY.  . simvastatin (ZOCOR) 40 MG tablet TAKE 1 TABLET BY MOUTH ONCE DAILY AT 6PM  . Turmeric 500 MG TABS Take  1 tablet by mouth daily.  . vitamin C (ASCORBIC ACID) 500 MG tablet Take 500 mg by mouth daily.     Allergies:   Losartan, Lisinopril, Metformin and related, Sulfonamide derivatives, and Augmentin [amoxicillin-pot clavulanate]   Social History   Socioeconomic History  . Marital status: Married    Spouse name: Not on file  . Number of children: Not on file  . Years of education: Not on file  . Highest education level: Not on file  Occupational History  . Not on file  Social Needs  . Financial resource strain: Not hard at all  . Food insecurity    Worry: Never true    Inability: Never true  . Transportation needs    Medical: No    Non-medical: No  Tobacco Use  .  Smoking status: Former Smoker    Packs/day: 0.50    Years: 4.00    Pack years: 2.00    Quit date: 12/10/1963    Years since quitting: 55.6  . Smokeless tobacco: Never Used  Substance and Sexual Activity  . Alcohol use: Yes    Comment: Occasional  . Drug use: No  . Sexual activity: Not on file  Lifestyle  . Physical activity    Days per week: Not on file    Minutes per session: Not on file  . Stress: Only a little  Relationships  . Social Herbalist on phone: More than three times a week    Gets together: Not on file    Attends religious service: Not on file    Active member of club or organization: Yes    Attends meetings of clubs or organizations: More than 4 times per year    Relationship status: Married  Other Topics Concern  . Not on file  Social History Narrative   Lives in [place] with wife. 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  ROS:   Review of Systems  Constitutional: Negative for chills, diaphoresis, fever, malaise/fatigue and weight loss.  HENT: Negative for congestion.   Eyes: Negative for discharge and redness.  Respiratory: Negative for cough, hemoptysis, sputum production, shortness of breath and wheezing.   Cardiovascular: Negative for chest pain, palpitations, orthopnea, claudication, leg swelling and PND.  Gastrointestinal: Negative for abdominal pain, blood in stool, heartburn, melena, nausea and vomiting.  Genitourinary: Negative for hematuria.  Musculoskeletal: Negative for falls and myalgias.   Skin: Negative for rash.  Neurological: Negative for dizziness, tingling, tremors, sensory change, speech change, focal weakness, loss of consciousness and weakness.  Endo/Heme/Allergies: Does not bruise/bleed easily.  Psychiatric/Behavioral: Negative for substance abuse. The patient is not nervous/anxious.   All other systems reviewed and are negative.    EKGs/Labs/Other Studies Reviewed:    Studies reviewed were summarized above. The additional studies were reviewed today:  2D Echo 06/2012: Left ventricle: The cavity size was normal. There was mild  concentric hypertrophy. Systolic function was normal. The  estimated ejection fraction was in  the range of 60% to 65%.  Wall motion was normal; there were no regional wall motion  abnormalities. Left ventricular diastolic function  parameters were normal.     EKG:  EKG is ordered today.  The EKG ordered today demonstrates sinus bradycardia, 48 bpm, low voltage QRS along the precordial leads, no acute ST-T changes (unchanged from prior)  Recent Labs: 05/11/2019: ALT 28; BUN 17; Creatinine, Ser 0.98; Potassium 4.7; Sodium 140  Recent Lipid Panel    Component Value Date/Time   CHOL 102 05/11/2019 0803   TRIG 39.0 05/11/2019 0803   HDL 45.70 05/11/2019 0803   CHOLHDL 2 05/11/2019 0803   VLDL 7.8 05/11/2019 0803   LDLCALC 48 05/11/2019 0803   LDLDIRECT 60.0 09/29/2017 1602    PHYSICAL EXAM:    VS:  BP 130/72 (BP Location: Left Arm, Patient Position: Sitting, Cuff Size: Normal)   Pulse (!) 48   Temp (!) 97.1 F (36.2 C)   Ht _0  (1.778 m)   Wt 202 lb 8 oz (91.9 kg)   SpO2 96%   BMI 29.06 kg/m   BMI: Body mass index is 29.06 kg/m.  Physical Exam  Constitutional: He is oriented to person, place, and time. He appears well-developed and well-nourished.  HENT:  Head: Normocephalic and atraumatic.  Eyes: Right eye exhibits no discharge. Left eye exhibits no discharge.  Neck: Normal range of motion. No JVD present.   Cardiovascular: Regular rhythm, S1 normal, S2 normal and normal heart sounds. Bradycardia present. Exam reveals no distant heart sounds, no friction rub, no midsystolic click and no opening snap.  No murmur heard. Pulses:      Posterior tibial pulses are 2+ on the right side and 2+ on the left side.  Pulmonary/Chest: Effort normal and breath sounds normal. No respiratory distress. He has no decreased breath sounds. He has no wheezes. He has no rales. He exhibits no tenderness.  Abdominal: Soft. He exhibits no distension. There is no abdominal tenderness.  Musculoskeletal:        General: No edema.  Neurological: He is alert and oriented to person, place, and time.  Skin: Skin is warm and dry. No cyanosis. Nails show no clubbing.  Psychiatric: He has a normal mood and affect. His speech is normal and behavior is normal. Judgment and thought content normal.    Wt Readings from Last 3 Encounters:  07/09/19 202 lb 8 oz (91.9 kg)  05/11/19 199 lb 6.4 oz (90.4 kg)  01/11/19 193 lb (87.5 kg)     ASSESSMENT & PLAN:   1. Asymptomatic bradycardia: Remains bradycardic with heart rates consistent with prior readings.  Asymptomatic.  Continue to avoid beta-blockers and non-titer.  And calcium channel blockers.  No prior TSH on file for review, will check today.  Most recent potassium well controlled at 4.7.  Patient indicates appropriate chronotropic response when performing activities.  No current indication for pacemaker.  Continue to monitor.  2. Hypertension: Blood pressure reasonably controlled today.  Weight is down another 6 pounds.  For now, continue current medications including amlodipine and hydrochlorothiazide.  3. Hyperlipidemia: LDL of 48 from 05/2019.  Remains on simvastatin.  Followed by PCP.  4. Diabetes: A1c 6.5 from 05/2019.  Followed by PCP.  5. History of thrombocytopenia: Most recent CBC from 2016 demonstrated low normal platelet count.  Remains on aspirin 81 mg daily.  Check CBC  today.   Disposition: F/u with Dr. Rockey Situ or an APP in 12 months.   Medication Adjustments/Labs and  Tests Ordered: Current medicines are reviewed at length with the patient today.  Concerns regarding medicines are outlined above. Medication changes, Labs and Tests ordered today are summarized above and listed in the Patient Instructions accessible in Encounters.   Signed, Christell Faith, PA-C 07/09/2019 8:11 AM     [place] [address] [place], [place] [address] [phone]

## 2019-07-08 NOTE — Telephone Encounter (Signed)

## 2019-07-09 ENCOUNTER — Ambulatory Visit (INDEPENDENT_AMBULATORY_CARE_PROVIDER_SITE_OTHER): Payer: Medicare HMO | Admitting: Physician Assistant

## 2019-07-09 ENCOUNTER — Encounter: Payer: Self-pay | Admitting: Physician Assistant

## 2019-07-09 ENCOUNTER — Other Ambulatory Visit: Payer: Self-pay

## 2019-07-09 VITALS — BP 130/72 | HR 48 | Temp 97.1°F | Ht 70.0 in | Wt 202.5 lb

## 2019-07-09 DIAGNOSIS — Z862 Personal history of diseases of the blood and blood-forming organs and certain disorders involving the immune mechanism: Secondary | ICD-10-CM

## 2019-07-09 DIAGNOSIS — I10 Essential (primary) hypertension: Secondary | ICD-10-CM | POA: Diagnosis not present

## 2019-07-09 DIAGNOSIS — D696 Thrombocytopenia, unspecified: Secondary | ICD-10-CM

## 2019-07-09 DIAGNOSIS — E785 Hyperlipidemia, unspecified: Secondary | ICD-10-CM | POA: Diagnosis not present

## 2019-07-09 DIAGNOSIS — R001 Bradycardia, unspecified: Secondary | ICD-10-CM | POA: Diagnosis not present

## 2019-07-09 DIAGNOSIS — E1169 Type 2 diabetes mellitus with other specified complication: Secondary | ICD-10-CM

## 2019-07-09 NOTE — Patient Instructions (Signed)
Medication Instructions:  - Your physician recommends that you continue on your current medications as directed. Please refer to the Current Medication list given to you today.  If you need a refill on your cardiac medications before your next appointment, please call your pharmacy.   Lab work: - Your physician recommends that you have lab work today: TSH/ CBC  If you have labs (blood work) drawn today and your tests are completely normal, you will receive your results only by: Marland Kitchen MyChart Message (if you have MyChart) OR . A paper copy in the mail If you have any lab test that is abnormal or we need to change your treatment, we will call you to review the results.  Testing/Procedures: - none ordered  Follow-Up: At [facility], you and your health needs are our priority.  As part of our continuing mission to provide you with exceptional heart care, we have created designated Provider Care Teams.  These Care Teams include your primary Cardiologist (physician) and Advanced Practice Providers (APPs -  Physician Assistants and Nurse Practitioners) who all work together to provide you with the care you need, when you need it.  You will need a follow up appointment in 1 year. (late July/ early August 2021). Please call our office 2 months in advance to schedule this appointment. (call in early May 2021 to schedule)  You may see Ida Rogue, MDor one of the following Advanced Practice Providers on your designated Care Team:   Murray Hodgkins, NP Christell Faith, PA-C . Marrianne Mood, PA-C  Any Other Special Instructions Will Be Listed Below (If Applicable). - N/A

## 2019-07-10 LAB — CBC WITH DIFFERENTIAL/PLATELET

## 2019-07-10 LAB — TSH: TSH: 2.32 u[IU]/mL (ref 0.450–4.500)

## 2019-07-11 DIAGNOSIS — D696 Thrombocytopenia, unspecified: Secondary | ICD-10-CM

## 2019-07-12 ENCOUNTER — Ambulatory Visit: Payer: Medicare HMO | Admitting: Internal Medicine

## 2019-07-12 ENCOUNTER — Other Ambulatory Visit: Payer: Self-pay

## 2019-07-12 ENCOUNTER — Ambulatory Visit (INDEPENDENT_AMBULATORY_CARE_PROVIDER_SITE_OTHER): Payer: Medicare HMO

## 2019-07-12 ENCOUNTER — Other Ambulatory Visit
Admission: RE | Admit: 2019-07-12 | Discharge: 2019-07-12 | Disposition: A | Discharge status: Home / Self Care | Payer: Medicare HMO | Source: Ambulatory Visit | Attending: Physician Assistant | Admitting: Physician Assistant

## 2019-07-12 ENCOUNTER — Ambulatory Visit: Payer: Medicare HMO

## 2019-07-12 DIAGNOSIS — D696 Thrombocytopenia, unspecified: Secondary | ICD-10-CM

## 2019-07-12 DIAGNOSIS — Z0000 Encounter for general adult medical examination without abnormal findings: Secondary | ICD-10-CM | POA: Diagnosis not present

## 2019-07-12 LAB — CBC
HCT: 43.1 % (ref 39.0–52.0)
Hemoglobin: 14.9 g/dL (ref 13.0–17.0)
MCH: 32.7 pg (ref 26.0–34.0)
MCHC: 34.6 g/dL (ref 30.0–36.0)
MCV: 94.5 fL (ref 80.0–100.0)
Platelets: 162 10*3/uL (ref 150–400)
RBC: 4.56 MIL/uL (ref 4.22–5.81)
RDW: 12.9 % (ref 11.5–15.5)
WBC: 8 10*3/uL (ref 4.0–10.5)
nRBC: 0 % (ref 0.0–0.2)

## 2019-07-12 NOTE — Patient Instructions (Addendum)
  Richard Hardy , Thank you for taking time to come for your Medicare Wellness Visit. I appreciate your ongoing commitment to your health goals. Please review the following plan we discussed and let me know if I can assist you in the future.   These are the goals we discussed: Goals      Patient Stated   . Weight (lb) < 200 lb (90.7 kg) (pt-stated)     Lose about 10 lbs Lower A1C Decrease the need for blood pressure medication through diet and exercise        This is a list of the screening recommended for you and due dates:  Health Maintenance  Topic Date Due  . Flu Shot  07/10/2019  . Hemoglobin A1C  11/10/2019  . Eye exam for diabetics  01/15/2020  . Complete foot exam   05/10/2020  . Urine Protein Check  05/10/2020  . Tetanus Vaccine  06/21/2023  . Colon Cancer Screening  06/24/2027  .  Hepatitis C: One time screening is recommended by Center for Disease Control  (CDC) for  adults born from [birth year] through 1967.   Completed

## 2019-07-12 NOTE — Progress Notes (Signed)
Subjective:   Richard Hardy is a 73 y.o. male who presents for Medicare Annual/Subsequent preventive examination.  Review of Systems:  No ROS.  Medicare Wellness Virtual Visit.  Visual/audio telehealth visit, UTA vital signs.   See social history for additional risk factors.   Cardiac Risk Factors include: advanced age (>101mn, >>44women);male gender;diabetes mellitus;hypertension     Objective:    Vitals: There were no vitals taken for this visit.  There is no height or weight on file to calculate BMI.  Advanced Directives 07/12/2019 04/29/2018 06/23/2017 04/28/2017  Does Patient Have a Medical Advance Directive? Yes Yes Yes Yes  Type of AParamedicof [place]anawayLiving will [place]ald[place] will - [place] will  Does patient want to make changes to medical advance directive? No - Patient declined No - Patient declined - No - Patient declined  Copy of HLugoffin Chart? Yes - validated most recent copy scanned in chart (See row information) Yes - Yes    Tobacco Social History   Tobacco Use  Smoking Status Former Smoker  . Packs/day: 0.50  . Years: 4.00  . Pack years: 2.00  . Quit date: 12/10/1963  . Years since quitting: 55.6  Smokeless Tobacco Never Used     Counseling given: Not Answered   Clinical Intake:  Pre-visit preparation completed: Yes        Diabetes: Yes(Followed by pcp)  How often do you need to have someone help you when you read instructions, pamphlets, or other written materials from your doctor or pharmacy?: 1 - Never  Interpreter Needed?: No     Past Medical History:  Diagnosis Date  . Diabetes mellitus [place] complication [place] [address] . Glucose intolerance (impaired glucose tolerance)   . Hyperlipidemia   . Obesity   . Sleep apnea    On BiPAP, Dr. BRichardson Landry . Thrombocythemia ([place]    Dr. FGrayland Ormond  Past Surgical History:  Procedure Laterality Date   . CATARACT EXTRACTION, BILATERAL    . COLONOSCOPY WITH PROPOFOL N/A 06/23/2017   Procedure: COLONOSCOPY WITH PROPOFOL;  Surgeon: EManya Silvas MD;  [facility];  Service: Endoscopy;  Laterality: N/A;  . EYE SURGERY    . LITHOTRIPSY     Dr. SBernardo Heater . NASAL SEPTUM SURGERY     Family History  Problem Relation Age of Onset  . Cancer Mother        brain  . Heart attack Father 671 . Heart disease Father   . Heart disease Brother        s/p CABG  . Crohn's disease Brother   . Hyperlipidemia Son   . Hypertension Son   . Sleep apnea Son   . Thyroid disease Son   . Heart disease Paternal Grandfather    Social History   Socioeconomic History  . Marital status: Married    Spouse name: Not on file  . Number of children: Not on file  . Years of education: Not on file  . Highest education level: Not on file  Occupational History  . Not on file  Social Needs  . Financial resource strain: Not hard at all  . Food insecurity    Worry: Never true    Inability: Never true  . Transportation needs    Medical: No    Non-medical: No  Tobacco Use  . Smoking status: Former Smoker    Packs/day: 0.50    Years: 4.00  Pack years: 2.00    Quit date: 12/10/1963    Years since quitting: 55.6  . Smokeless tobacco: Never Used  Substance and Sexual Activity  . Alcohol use: Yes    Comment: Occasional  . Drug use: No  . Sexual activity: Not on file  Lifestyle  . Physical activity    Days per week: Not on file    Minutes per session: Not on file  . Stress: Only a little  Relationships  . Social Herbalist on phone: More than three times a week    Gets together: Not on file    Attends religious service: Not on file    Active member of club or organization: Yes    Attends meetings of clubs or organizations: More than 4 times per year    Relationship status: Married  Other Topics Concern  . Not on file  Social History Narrative   Lives in [place] with wife.  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 amLODipine (NORVASC) 5 MG tablet TAKE 1 TABLET BY MOUTH EVERY DAY  . aspirin EC 81 MG tablet Take 1 tablet (81 mg total) by mouth daily.  . Blood Glucose Monitoring Suppl (ONETOUCH VERIO IQ SYSTEM) w/Device KIT Use to check blood sugars once daily.  Marland Kitchen CINNAMON PO Take 2 capsules by mouth daily.  . Coenzyme Q10 (COQ10) 200 MG CAPS Take 1 tablet by mouth daily.    . Fish Oil OIL Take by mouth daily.    . fluticasone (FLONASE) 50 MCG/ACT nasal spray Place into the nose at bedtime.    . hydrochlorothiazide (HYDRODIURIL) 25 MG tablet TAKE 1 TABLET BY MOUTH EVERY DAY  . ONETOUCH DELICA LANCETS 76O MISC Use to check blood sugars once daily.  Glory Rosebush VERIO test strip USE TO CHECK SUGARS ONCE DAILY.  . simvastatin (ZOCOR) 40 MG tablet TAKE 1 TABLET BY MOUTH ONCE DAILY AT 6PM  . Turmeric 500 MG TABS Take 1 tablet by mouth daily.  . vitamin C (ASCORBIC ACID) 500 MG tablet Take 500 mg by mouth daily.   No facility-administered encounter medications on file as of 07/12/2019.     Activities of Daily Living In your present state of health, do you have any difficulty performing the following activities: 07/12/2019  Hearing? N  Vision? N  Difficulty concentrating or making decisions? N  Walking or climbing stairs? N  Dressing or bathing? N  Doing errands, shopping? N  Preparing Food and eating ? N  Using the Toilet? N  In the past six months, have you accidently leaked urine? N  Do you have problems with loss of bowel control? N  Managing your Medications? N  Managing your Finances? N  Housekeeping or managing your Housekeeping? N  Some recent data might be hidden    Patient Care Team: Crecencio Mc, MD as PCP - General (Internal Medicine) Minna Merritts, MD  as PCP - Cardiology (Cardiology) Jackolyn Confer, MD (Internal Medicine) Minna Merritts, MD as Consulting Physician (Cardiology)   Assessment:   This is a routine wellness examination for [facility].  I connected with patient 07/12/19 at 10:00 AM EDT by a video/audio enabled telemedicine application and verified that I am  speaking with the correct person using two identifiers. Patient stated full name and DOB. Patient gave permission to continue with virtual visit. Patient's location was at home and Nurse's location was at [place] office.   Health Screenings  Colonoscopy - 06/2017 Glaucoma -none Hearing -demonstrates normal hearing during visit. Hepatitis C Screening- 12/2016 Hemoglobin A1C - 05/2019 (6.5) Cholesterol - 05/2019 Dental- [facility]- visits within the last 12 months.  Social  Alcohol intake - yes      Smoking history- former    Smokers in home? none Illicit drug use? none Exercise - walking, golfing  Diet - low carb Sexually Active -not currently BMI- discussed the importance of a healthy diet, water intake and the benefits of aerobic exercise.  Educational material provided.   Safety  Patient feels safe at home- yes Patient does have smoke detectors at home- yes Patient does wear sunscreen or protective clothing when in direct sunlight -yes Patient does wear seat belt when in a moving vehicle -yes Patient drives- yes  [id number] precautions and sickness symptoms discussed.   Activities of Daily Living Patient denies needing assistance with: driving, household chores, feeding themselves, getting from bed to chair, getting to the toilet, bathing/showering, dressing, managing money, or preparing meals.  No new identified risk were noted.    Depression Screen Patient denies losing interest in daily life, feeling hopeless, or crying easily over simple problems.   Medication-taking as directed and without issues.   Fall Screen Patient denies being afraid of falling  or falling in the last year.   Memory Screen Patient is alert.  Patient denies difficulty focusing, concentrating or misplacing items. Correctly identified the president of the [place], season and recall. Patient likes to read the scriptures and focus for brain stimulation.  Immunizations The following Immunizations were discussed: Influenza, shingles, pneumonia, and tetanus.   Other Providers Patient Care Team: Crecencio Mc, MD as PCP - General (Internal Medicine) Minna Merritts, MD as PCP - Cardiology (Cardiology) Jackolyn Confer, MD (Internal Medicine) Minna Merritts, MD as Consulting Physician (Cardiology)   Exercise Activities and Dietary recommendations Current Exercise Habits: Home exercise routine, Type of exercise: walking;stretching(golf), Time (Minutes): 60, Frequency (Times/Week): 5, Weekly Exercise (Minutes/Week): 300, Intensity: Moderate  Goals      Patient Stated   . Weight (lb) < 200 lb (90.7 kg) (pt-stated)     Lose about 10 lbs Lower A1C Decrease the need for blood pressure medication through diet and exercise        Fall Risk Fall Risk  07/12/2019 04/29/2018 04/28/2017 05/20/2016 08/29/2015  Falls in the past year? 0 No No No No   Is the patient's home free of loose throw rugs in walkways, pet beds, electrical cords, etc? yes      Grab bars in the bathroom? yes      Handrails on the stairs?  yes      Adequate lighting?  yes  Depression Screen PHQ 2/9 Scores 07/12/2019 04/29/2018 04/28/2017 05/20/2016  PHQ - 2 Score 0 0 0 0  PHQ- 9 Score - - 0 -    Cognitive Function MMSE - Mini Mental State Exam 04/29/2018 04/28/2017  Orientation to time 5 5  Orientation to Place 5 5  Registration 3 3  Attention/ Calculation 5 5  Recall 3 3  Language- name 2 objects 2 2  Language- repeat 1 1  Language- follow 3 step command 3 3  Language- read & follow direction 1 1  Write a sentence 1  1  Copy design 1 1  Total score 30 30     6CIT Screen 07/12/2019  What  Year? 0 points  What month? 0 points  What time? 0 points  Count back from 20 0 points  Months in reverse 0 points  Repeat phrase 0 points  Total Score 0    Immunization History  Administered Date(s) Administered  . Influenza, High Dose Seasonal PF 11/20/2016, 09/29/2017, 09/30/2018  . Influenza,inj,Quad PF,6+ Mos 08/26/2014, 08/29/2015  . Influenza-Unspecified 09/20/2012, 09/24/2013  . Pneumococcal Conjugate-13 05/25/2014  . Pneumococcal Polysaccharide-23 07/21/2012, 09/30/2018  . Tdap 06/20/2013  . Zoster 06/20/2013   Screening Tests Health Maintenance  Topic Date Due  . INFLUENZA VACCINE  07/10/2019  . HEMOGLOBIN A1C  11/10/2019  . OPHTHALMOLOGY EXAM  01/15/2020  . FOOT EXAM  05/10/2020  . URINE MICROALBUMIN  05/10/2020  . TETANUS/TDAP  06/21/2023  . COLONOSCOPY  06/24/2027  . Hepatitis C Screening  Completed      Plan:    End of life planning; Advance aging; Advanced directives discussed.  Copy of current HCPOA/Living Will on file.    I have personally reviewed and noted the following in the patient's chart:   . Medical and social history . Use of alcohol, tobacco or illicit drugs  . Current medications and supplements . Functional ability and status . Nutritional status . Physical activity . Advanced directives . List of other physicians . Hospitalizations, surgeries, and ER visits in previous 12 months . Vitals . Screenings to include cognitive, depression, and falls . Referrals and appointments  In addition, I have reviewed and discussed with patient certain preventive protocols, quality metrics, and best practice recommendations. A written personalized care plan for preventive services as well as general preventive health recommendations were provided to patient.     Varney Biles, LPN  12/17/4172

## 2019-07-12 NOTE — Telephone Encounter (Signed)
Call to patient to discuss cancellation of lab, CBC, from last week.   She verbalized understanding that lab was collected in clinic and did not make it for some reason to the lab.   Pt will not be charged for redraw. [facility] either today or tomorrow.   Advised pt to call for any further questions or concerns.

## 2019-07-14 MED ORDER — SIMVASTATIN 40 MG PO TABS: ORAL_TABLET | ORAL | 1 refills | Status: DC

## 2019-08-03 DIAGNOSIS — M9903 Segmental and somatic dysfunction of lumbar region: Secondary | ICD-10-CM | POA: Diagnosis not present

## 2019-08-03 DIAGNOSIS — M531 Cervicobrachial syndrome: Secondary | ICD-10-CM | POA: Diagnosis not present

## 2019-08-03 DIAGNOSIS — M9905 Segmental and somatic dysfunction of pelvic region: Secondary | ICD-10-CM | POA: Diagnosis not present

## 2019-08-03 DIAGNOSIS — M9901 Segmental and somatic dysfunction of cervical region: Secondary | ICD-10-CM | POA: Diagnosis not present

## 2019-08-03 DIAGNOSIS — M5442 Lumbago with sciatica, left side: Secondary | ICD-10-CM | POA: Diagnosis not present

## 2019-08-03 DIAGNOSIS — M955 Acquired deformity of pelvis: Secondary | ICD-10-CM | POA: Diagnosis not present

## 2019-08-05 DIAGNOSIS — G4733 Obstructive sleep apnea (adult) (pediatric): Secondary | ICD-10-CM | POA: Diagnosis not present

## 2019-09-05 DIAGNOSIS — G4733 Obstructive sleep apnea (adult) (pediatric): Secondary | ICD-10-CM | POA: Diagnosis not present

## 2019-09-07 ENCOUNTER — Telehealth: Payer: Self-pay | Admitting: Internal Medicine

## 2019-09-07 DIAGNOSIS — M9901 Segmental and somatic dysfunction of cervical region: Secondary | ICD-10-CM | POA: Diagnosis not present

## 2019-09-07 DIAGNOSIS — M5442 Lumbago with sciatica, left side: Secondary | ICD-10-CM | POA: Diagnosis not present

## 2019-09-07 DIAGNOSIS — M531 Cervicobrachial syndrome: Secondary | ICD-10-CM | POA: Diagnosis not present

## 2019-09-07 DIAGNOSIS — M955 Acquired deformity of pelvis: Secondary | ICD-10-CM | POA: Diagnosis not present

## 2019-09-07 DIAGNOSIS — M9905 Segmental and somatic dysfunction of pelvic region: Secondary | ICD-10-CM | POA: Diagnosis not present

## 2019-09-07 DIAGNOSIS — M9903 Segmental and somatic dysfunction of lumbar region: Secondary | ICD-10-CM | POA: Diagnosis not present

## 2019-09-07 NOTE — Telephone Encounter (Signed)
Santiago Glad from [place] called to check status of a Medicare Part B audit request /progress notes  Please advise

## 2019-09-10 NOTE — Telephone Encounter (Signed)
Information has been faxed  

## 2019-09-17 ENCOUNTER — Other Ambulatory Visit: Payer: Self-pay | Admitting: Internal Medicine

## 2019-09-17 DIAGNOSIS — I10 Essential (primary) hypertension: Secondary | ICD-10-CM

## 2019-10-05 DIAGNOSIS — M5442 Lumbago with sciatica, left side: Secondary | ICD-10-CM | POA: Diagnosis not present

## 2019-10-05 DIAGNOSIS — M9905 Segmental and somatic dysfunction of pelvic region: Secondary | ICD-10-CM | POA: Diagnosis not present

## 2019-10-05 DIAGNOSIS — M9903 Segmental and somatic dysfunction of lumbar region: Secondary | ICD-10-CM | POA: Diagnosis not present

## 2019-10-05 DIAGNOSIS — M9901 Segmental and somatic dysfunction of cervical region: Secondary | ICD-10-CM | POA: Diagnosis not present

## 2019-10-05 DIAGNOSIS — M955 Acquired deformity of pelvis: Secondary | ICD-10-CM | POA: Diagnosis not present

## 2019-10-05 DIAGNOSIS — M531 Cervicobrachial syndrome: Secondary | ICD-10-CM | POA: Diagnosis not present

## 2019-10-07 DIAGNOSIS — R69 Illness, unspecified: Secondary | ICD-10-CM | POA: Diagnosis not present

## 2019-10-10 DIAGNOSIS — R69 Illness, unspecified: Secondary | ICD-10-CM | POA: Diagnosis not present

## 2019-11-08 ENCOUNTER — Other Ambulatory Visit: Payer: Self-pay

## 2019-11-10 ENCOUNTER — Other Ambulatory Visit (INDEPENDENT_AMBULATORY_CARE_PROVIDER_SITE_OTHER): Payer: Medicare HMO

## 2019-11-10 ENCOUNTER — Other Ambulatory Visit: Payer: Self-pay

## 2019-11-10 DIAGNOSIS — E119 Type 2 diabetes mellitus without complications: Secondary | ICD-10-CM | POA: Diagnosis not present

## 2019-11-10 DIAGNOSIS — Z125 Encounter for screening for malignant neoplasm of prostate: Secondary | ICD-10-CM | POA: Diagnosis not present

## 2019-11-10 DIAGNOSIS — E1169 Type 2 diabetes mellitus with other specified complication: Secondary | ICD-10-CM | POA: Diagnosis not present

## 2019-11-10 DIAGNOSIS — E785 Hyperlipidemia, unspecified: Secondary | ICD-10-CM | POA: Diagnosis not present

## 2019-11-10 LAB — LIPID PANEL
Cholesterol: 106 mg/dL (ref 0–200)
HDL: 40.8 mg/dL (ref >39.00)
LDL Cholesterol: 55 mg/dL (ref 0–99)
NonHDL: 65.08
Total CHOL/HDL Ratio: 3
Triglycerides: 51 mg/dL (ref 0.0–149.0)
VLDL: 10.2 mg/dL (ref 0.0–40.0)

## 2019-11-10 LAB — COMPREHENSIVE METABOLIC PANEL
ALT: 31 U/L (ref 0–53)
AST: 25 U/L (ref 0–37)
Albumin: 4.4 g/dL (ref 3.5–5.2)
Alkaline Phosphatase: 60 U/L (ref 39–117)
BUN: 16 mg/dL (ref 6–23)
CO2: 31 mEq/L (ref 19–32)
Calcium: 9.4 mg/dL (ref 8.4–10.5)
Chloride: 105 mEq/L (ref 96–112)
Creatinine, Ser: 1 mg/dL (ref 0.40–1.50)
GFR: 72.69 mL/min (ref >60.00)
Glucose, Bld: 133 mg/dL — ABNORMAL HIGH (ref 70–99)
Potassium: 4.9 mEq/L (ref 3.5–5.1)
Sodium: 142 mEq/L (ref 135–145)
Total Bilirubin: 0.9 mg/dL (ref 0.2–1.2)
Total Protein: 6.6 g/dL (ref 6.0–8.3)

## 2019-11-10 LAB — HEMOGLOBIN A1C: Hgb A1c MFr Bld: 6.7 % — ABNORMAL HIGH (ref 4.6–6.5)

## 2019-11-10 LAB — PSA, MEDICARE: PSA: 0.32 ng/ml (ref 0.10–4.00)

## 2019-11-16 DIAGNOSIS — M531 Cervicobrachial syndrome: Secondary | ICD-10-CM | POA: Diagnosis not present

## 2019-11-16 DIAGNOSIS — M5442 Lumbago with sciatica, left side: Secondary | ICD-10-CM | POA: Diagnosis not present

## 2019-11-16 DIAGNOSIS — M955 Acquired deformity of pelvis: Secondary | ICD-10-CM | POA: Diagnosis not present

## 2019-11-16 DIAGNOSIS — M9903 Segmental and somatic dysfunction of lumbar region: Secondary | ICD-10-CM | POA: Diagnosis not present

## 2019-11-16 DIAGNOSIS — M9905 Segmental and somatic dysfunction of pelvic region: Secondary | ICD-10-CM | POA: Diagnosis not present

## 2019-11-16 DIAGNOSIS — M9901 Segmental and somatic dysfunction of cervical region: Secondary | ICD-10-CM | POA: Diagnosis not present

## 2019-11-17 ENCOUNTER — Encounter: Payer: Self-pay | Admitting: Internal Medicine

## 2019-11-17 ENCOUNTER — Other Ambulatory Visit: Payer: Self-pay

## 2019-11-17 ENCOUNTER — Ambulatory Visit (INDEPENDENT_AMBULATORY_CARE_PROVIDER_SITE_OTHER): Payer: Medicare HMO | Admitting: Internal Medicine

## 2019-11-17 DIAGNOSIS — I10 Essential (primary) hypertension: Secondary | ICD-10-CM

## 2019-11-17 DIAGNOSIS — E1169 Type 2 diabetes mellitus with other specified complication: Secondary | ICD-10-CM

## 2019-11-17 DIAGNOSIS — E785 Hyperlipidemia, unspecified: Secondary | ICD-10-CM | POA: Diagnosis not present

## 2019-11-17 DIAGNOSIS — E119 Type 2 diabetes mellitus without complications: Secondary | ICD-10-CM

## 2019-11-17 MED ORDER — ATORVASTATIN CALCIUM 20 MG PO TABS: 20.0000 mg | ORAL_TABLET | Freq: Every day | ORAL | 3 refills | Status: DC

## 2019-11-17 NOTE — Patient Instructions (Signed)
Your diabetes remains under excellent control  And your cholesterol and other labs are also normal. Please continue your current medications. return in 6 months for follow up on diabetes and make sure you are seeing your eye doctor at least once a year for a dilated retina exam to monitor for diabetic retinopathy,. changes that can lead to blindness .    I have changed your cholesterol medication to generic lipitor to avoid any potential side effects.  You may finish your current supply of simvastatin first.    Regards,   Deborra Medina, MD

## 2019-11-17 NOTE — Assessment & Plan Note (Signed)
Well controlled on current regimen. Renal function stable, no changes today.  Lab Results  Component Value Date   CREATININE 1.00 11/10/2019   Lab Results  Component Value Date   NA 142 11/10/2019   K 4.9 11/10/2019   CL 105 11/10/2019   CO2 31 11/10/2019

## 2019-11-17 NOTE — Addendum Note (Signed)
Addended by: Crecencio Mc on: 11/17/2019 08:35 AM   Modules accepted: Orders

## 2019-11-17 NOTE — Assessment & Plan Note (Signed)
LDL and triglycerides remain at goal on current medications without side effects or  liver enzymes elevation.  Changing simvastatin  to low dose lipitor   Lab Results  Component Value Date   CHOL 106 11/10/2019   HDL 40.80 11/10/2019   LDLCALC 55 11/10/2019   LDLDIRECT 60.0 09/29/2017   TRIG 51.0 11/10/2019   CHOLHDL 3 11/10/2019   Lab Results  Component Value Date   ALT 31 11/10/2019   AST 25 11/10/2019   ALKPHOS 60 11/10/2019   BILITOT 0.9 11/10/2019

## 2019-11-17 NOTE — Assessment & Plan Note (Signed)
Currently well-controlled on current medications .  hemoglobin A1c is at goal of less than 7.0 . Patient is reminded to schedule an annual eye exam and foot exam is normal today. Patient has no microalbuminuria. Patient is tolerating statin therapy for CAD risk reduction .   ACE/ARB is contraindicated due to hyperkalemia  Lab Results  Component Value Date   HGBA1C 6.7 (H) 11/10/2019   Lab Results  Component Value Date   MICROALBUR 0.8 05/11/2019   MICROALBUR 1.3 03/30/2018

## 2019-11-17 NOTE — Progress Notes (Signed)
Telephone  Note  This visit type was conducted due to national recommendations for restrictions regarding the COVID-19 pandemic (e.g. social distancing).  This format is felt to be most appropriate for this patient at this time.  All issues noted in this document were discussed and addressed.  No physical exam was performed (except for noted visual exam findings with Video Visits).   I connected with@ on 11/17/19 at  8:00 AM EST by telephone and verified that I am speaking with the correct person using two identifiers. Location patient: home Location provider: work or home office Persons participating in the virtual visit: patient, provider  I discussed the limitations, risks, security and privacy concerns of performing an evaluation and management service by telephone and the availability of in person appointments. I also discussed with the patient that there may be a patient responsible charge related to this service. The patient expressed understanding and agreed to proceed.   Reason for visit: 6 month follow up   HPI:  73 yr old male with type 2 DM, HTN and hyperlipidemia presents for follow up.    The patient has no signs or symptoms of COVID 19 infection (fever, cough, sore throat  or shortness of breath beyond what is typical for patient).  Patient denies contact with other persons with the above mentioned symptoms or with anyone confirmed to have COVID 19 .  he has been minimizing her contact with the public and using a mask and hand sanitizer when she comes into any contact with the public.     Playing golf  And doing yard work for exercise .  Weight stable, thinks he may have lost a few lbs.   Patient does not check blood sugars more than once a month,  Last one was 135 a month ago in a fasting state.  Dos not recall any above 160 or less than 80.  No complaints today.  Taking his medications for hypertension and hyperlipidemia  as directed, follows a low GI diet about 90% of the  time.  Some symptoms of hypoglycemia in the  Early evening, just before dinner   Has not had an annual diabetic eye exam.  Denies numbness and tingling in lower extremities. .   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  Past Medical History:  Diagnosis Date  . Diabetes mellitus without complication ([place]) 9833  . Glucose intolerance (impaired glucose tolerance)   . Hyperlipidemia   . Obesity   . Sleep apnea    On BiPAP, Dr. Richardson Landry  . Thrombocythemia ([place])    Dr. Grayland Ormond    Past Surgical History:  Procedure Laterality Date  . CATARACT EXTRACTION, BILATERAL    . COLONOSCOPY WITH PROPOFOL N/A 06/23/2017   Procedure: COLONOSCOPY WITH PROPOFOL;  Surgeon: Manya Silvas, MD;  Location[facility];  Service: Endoscopy;  Laterality: N/A;  . EYE SURGERY    . LITHOTRIPSY     Dr. Bernardo Heater  . NASAL SEPTUM SURGERY      Family History  Problem Relation Age of Onset  . Cancer Mother        brain  . Heart attack Father 10  . Heart disease Father   . Heart disease Brother        s/p CABG  . Crohn's disease Brother   . Hyperlipidemia Son   . Hypertension Son   . Sleep apnea Son   . Thyroid disease Son   . Heart  disease Paternal Grandfather     SOCIAL HX:  reports that he quit smoking about 55 years ago. He has a 2.00 pack-year smoking history. He has never used smokeless tobacco. He reports current alcohol use. He reports that he does not use drugs.   Current Outpatient Medications:  .  amLODipine (NORVASC) 5 MG tablet, TAKE 1 TABLET BY MOUTH EVERY DAY, Disp: 90 tablet, Rfl: 3 .  aspirin EC 81 MG tablet, Take 1 tablet (81 mg total) by mouth daily., Disp: 90 tablet, Rfl: 4 .  Blood Glucose Monitoring Suppl (ONETOUCH VERIO IQ SYSTEM) w/Device KIT, Use to check blood sugars once daily., Disp: 1 kit, Rfl: 0 .  CINNAMON PO, Take 2 capsules by mouth daily., Disp: , Rfl:  .  Coenzyme Q10 (COQ10) 200 MG CAPS, Take 1 tablet  by mouth daily.  , Disp: , Rfl:  .  Fish Oil OIL, Take by mouth daily.  , Disp: , Rfl:  .  fluticasone (FLONASE) 50 MCG/ACT nasal spray, Place into the nose at bedtime.  , Disp: , Rfl:  .  hydrochlorothiazide (HYDRODIURIL) 25 MG tablet, TAKE 1 TABLET BY MOUTH EVERY DAY, Disp: 90 tablet, Rfl: 3 .  ONETOUCH DELICA LANCETS 93X MISC, Use to check blood sugars once daily., Disp: 100 each, Rfl: 1 .  ONETOUCH VERIO test strip, USE TO CHECK SUGARS ONCE DAILY., Disp: 100 strip, Rfl: 1 .  Turmeric 500 MG TABS, Take 1 tablet by mouth daily., Disp: , Rfl:  .  vitamin C (ASCORBIC ACID) 500 MG tablet, Take 500 mg by mouth daily., Disp: , Rfl:  .  atorvastatin (LIPITOR) 20 MG tablet, Take 1 tablet (20 mg total) by mouth daily., Disp: 90 tablet, Rfl: 3  EXAM:   General impression: alert, cooperative and articulate.  No signs of being in distress  Lungs: speech is fluent sentence length suggests that patient is not short of breath and not punctuated by cough, sneezing or sniffing. Marland Kitchen   Psych: affect normal.  speech is articulate and non pressured .  Denies suicidal thoughts   ASSESSMENT AND PLAN:  Discussed the following assessment and plan:  Diabetes mellitus without complication ([place])  Hyperlipidemia associated with type 2 diabetes mellitus ([place])  Essential hypertension  Diabetes mellitus without complication ([place]) Currently well-controlled on current medications .  hemoglobin A1c is at goal of less than 7.0 . Patient is reminded to schedule an annual eye exam and foot exam is normal today. Patient has no microalbuminuria. Patient is tolerating statin therapy for CAD risk reduction .   ACE/ARB is contraindicated due to hyperkalemia  Lab Results  Component Value Date   HGBA1C 6.7 (H) 11/10/2019   Lab Results  Component Value Date   MICROALBUR 0.8 05/11/2019   MICROALBUR 1.3 03/30/2018       Hyperlipidemia associated with type 2 diabetes mellitus (HCC) LDL and triglycerides remain at goal  on current medications without side effects or  liver enzymes elevation.  Changing simvastatin  to low dose lipitor   Lab Results  Component Value Date   CHOL 106 11/10/2019   HDL 40.80 11/10/2019   LDLCALC 55 11/10/2019   LDLDIRECT 60.0 09/29/2017   TRIG 51.0 11/10/2019   CHOLHDL 3 11/10/2019   Lab Results  Component Value Date   ALT 31 11/10/2019   AST 25 11/10/2019   ALKPHOS 60 11/10/2019   BILITOT 0.9 11/10/2019      Hypertension Well controlled on current regimen. Renal function stable, no changes today.  Lab  Results  Component Value Date   CREATININE 1.00 11/10/2019   Lab Results  Component Value Date   NA 142 11/10/2019   K 4.9 11/10/2019   CL 105 11/10/2019   CO2 31 11/10/2019       I discussed the assessment and treatment plan with the patient. The patient was provided an opportunity to ask questions and all were answered. The patient agreed with the plan and demonstrated an understanding of the instructions.   The patient was advised to call back or seek an in-person evaluation if the symptoms worsen or if the condition fails to improve as anticipated.  I provided  22 minutes of non-face-to-face time during this encounter reviewing patient's current problems and past procedures/imaging studies, providing counseling on the above mentioned problems , and coordination  of care .   Crecencio Mc, MD

## 2019-11-18 ENCOUNTER — Other Ambulatory Visit: Payer: Self-pay | Admitting: Internal Medicine

## 2019-12-14 DIAGNOSIS — G4733 Obstructive sleep apnea (adult) (pediatric): Secondary | ICD-10-CM | POA: Diagnosis not present

## 2019-12-17 DIAGNOSIS — R69 Illness, unspecified: Secondary | ICD-10-CM | POA: Diagnosis not present

## 2019-12-21 DIAGNOSIS — M9902 Segmental and somatic dysfunction of thoracic region: Secondary | ICD-10-CM | POA: Diagnosis not present

## 2019-12-21 DIAGNOSIS — M9905 Segmental and somatic dysfunction of pelvic region: Secondary | ICD-10-CM | POA: Diagnosis not present

## 2019-12-21 DIAGNOSIS — M5442 Lumbago with sciatica, left side: Secondary | ICD-10-CM | POA: Diagnosis not present

## 2019-12-21 DIAGNOSIS — M9903 Segmental and somatic dysfunction of lumbar region: Secondary | ICD-10-CM | POA: Diagnosis not present

## 2019-12-21 DIAGNOSIS — M546 Pain in thoracic spine: Secondary | ICD-10-CM | POA: Diagnosis not present

## 2019-12-21 DIAGNOSIS — M955 Acquired deformity of pelvis: Secondary | ICD-10-CM | POA: Diagnosis not present

## 2019-12-29 DIAGNOSIS — R69 Illness, unspecified: Secondary | ICD-10-CM | POA: Diagnosis not present

## 2020-01-14 DIAGNOSIS — G4733 Obstructive sleep apnea (adult) (pediatric): Secondary | ICD-10-CM | POA: Diagnosis not present

## 2020-01-17 DIAGNOSIS — D3131 Benign neoplasm of right choroid: Secondary | ICD-10-CM | POA: Diagnosis not present

## 2020-01-17 DIAGNOSIS — H43813 Vitreous degeneration, bilateral: Secondary | ICD-10-CM | POA: Diagnosis not present

## 2020-01-17 LAB — HM DIABETES EYE EXAM

## 2020-01-18 DIAGNOSIS — R69 Illness, unspecified: Secondary | ICD-10-CM | POA: Diagnosis not present

## 2020-02-01 DIAGNOSIS — M5442 Lumbago with sciatica, left side: Secondary | ICD-10-CM | POA: Diagnosis not present

## 2020-02-01 DIAGNOSIS — M9902 Segmental and somatic dysfunction of thoracic region: Secondary | ICD-10-CM | POA: Diagnosis not present

## 2020-02-01 DIAGNOSIS — M9903 Segmental and somatic dysfunction of lumbar region: Secondary | ICD-10-CM | POA: Diagnosis not present

## 2020-02-01 DIAGNOSIS — M955 Acquired deformity of pelvis: Secondary | ICD-10-CM | POA: Diagnosis not present

## 2020-02-01 DIAGNOSIS — M546 Pain in thoracic spine: Secondary | ICD-10-CM | POA: Diagnosis not present

## 2020-02-01 DIAGNOSIS — M9905 Segmental and somatic dysfunction of pelvic region: Secondary | ICD-10-CM | POA: Diagnosis not present

## 2020-02-02 DIAGNOSIS — L578 Other skin changes due to chronic exposure to nonionizing radiation: Secondary | ICD-10-CM | POA: Diagnosis not present

## 2020-02-02 DIAGNOSIS — L820 Inflamed seborrheic keratosis: Secondary | ICD-10-CM | POA: Diagnosis not present

## 2020-02-02 DIAGNOSIS — L821 Other seborrheic keratosis: Secondary | ICD-10-CM | POA: Diagnosis not present

## 2020-02-02 DIAGNOSIS — I872 Venous insufficiency (chronic) (peripheral): Secondary | ICD-10-CM | POA: Diagnosis not present

## 2020-02-02 DIAGNOSIS — L570 Actinic keratosis: Secondary | ICD-10-CM | POA: Diagnosis not present

## 2020-02-02 DIAGNOSIS — C4491 Basal cell carcinoma of skin, unspecified: Secondary | ICD-10-CM

## 2020-02-02 DIAGNOSIS — C44719 Basal cell carcinoma of skin of left lower limb, including hip: Secondary | ICD-10-CM | POA: Diagnosis not present

## 2020-02-02 DIAGNOSIS — Z1283 Encounter for screening for malignant neoplasm of skin: Secondary | ICD-10-CM | POA: Diagnosis not present

## 2020-02-02 DIAGNOSIS — Z85828 Personal history of other malignant neoplasm of skin: Secondary | ICD-10-CM | POA: Diagnosis not present

## 2020-02-02 DIAGNOSIS — D179 Benign lipomatous neoplasm, unspecified: Secondary | ICD-10-CM | POA: Diagnosis not present

## 2020-02-02 HISTORY — DX: Basal cell carcinoma of skin, unspecified: C44.91

## 2020-02-11 DIAGNOSIS — G4733 Obstructive sleep apnea (adult) (pediatric): Secondary | ICD-10-CM | POA: Diagnosis not present

## 2020-02-29 DIAGNOSIS — M5442 Lumbago with sciatica, left side: Secondary | ICD-10-CM | POA: Diagnosis not present

## 2020-02-29 DIAGNOSIS — M9903 Segmental and somatic dysfunction of lumbar region: Secondary | ICD-10-CM | POA: Diagnosis not present

## 2020-02-29 DIAGNOSIS — M546 Pain in thoracic spine: Secondary | ICD-10-CM | POA: Diagnosis not present

## 2020-02-29 DIAGNOSIS — M9905 Segmental and somatic dysfunction of pelvic region: Secondary | ICD-10-CM | POA: Diagnosis not present

## 2020-02-29 DIAGNOSIS — M9902 Segmental and somatic dysfunction of thoracic region: Secondary | ICD-10-CM | POA: Diagnosis not present

## 2020-02-29 DIAGNOSIS — M955 Acquired deformity of pelvis: Secondary | ICD-10-CM | POA: Diagnosis not present

## 2020-03-21 DIAGNOSIS — R69 Illness, unspecified: Secondary | ICD-10-CM | POA: Diagnosis not present

## 2020-03-23 DIAGNOSIS — G4733 Obstructive sleep apnea (adult) (pediatric): Secondary | ICD-10-CM | POA: Diagnosis not present

## 2020-03-29 DIAGNOSIS — M9902 Segmental and somatic dysfunction of thoracic region: Secondary | ICD-10-CM | POA: Diagnosis not present

## 2020-03-29 DIAGNOSIS — M955 Acquired deformity of pelvis: Secondary | ICD-10-CM | POA: Diagnosis not present

## 2020-03-29 DIAGNOSIS — M546 Pain in thoracic spine: Secondary | ICD-10-CM | POA: Diagnosis not present

## 2020-03-29 DIAGNOSIS — M9903 Segmental and somatic dysfunction of lumbar region: Secondary | ICD-10-CM | POA: Diagnosis not present

## 2020-03-29 DIAGNOSIS — M5442 Lumbago with sciatica, left side: Secondary | ICD-10-CM | POA: Diagnosis not present

## 2020-03-29 DIAGNOSIS — M9905 Segmental and somatic dysfunction of pelvic region: Secondary | ICD-10-CM | POA: Diagnosis not present

## 2020-04-22 DIAGNOSIS — G4733 Obstructive sleep apnea (adult) (pediatric): Secondary | ICD-10-CM | POA: Diagnosis not present

## 2020-05-02 DIAGNOSIS — M955 Acquired deformity of pelvis: Secondary | ICD-10-CM | POA: Diagnosis not present

## 2020-05-02 DIAGNOSIS — M546 Pain in thoracic spine: Secondary | ICD-10-CM | POA: Diagnosis not present

## 2020-05-02 DIAGNOSIS — M9903 Segmental and somatic dysfunction of lumbar region: Secondary | ICD-10-CM | POA: Diagnosis not present

## 2020-05-02 DIAGNOSIS — M9902 Segmental and somatic dysfunction of thoracic region: Secondary | ICD-10-CM | POA: Diagnosis not present

## 2020-05-02 DIAGNOSIS — M5442 Lumbago with sciatica, left side: Secondary | ICD-10-CM | POA: Diagnosis not present

## 2020-05-02 DIAGNOSIS — M9905 Segmental and somatic dysfunction of pelvic region: Secondary | ICD-10-CM | POA: Diagnosis not present

## 2020-05-15 ENCOUNTER — Other Ambulatory Visit (INDEPENDENT_AMBULATORY_CARE_PROVIDER_SITE_OTHER): Payer: Medicare HMO

## 2020-05-15 ENCOUNTER — Other Ambulatory Visit: Payer: Self-pay

## 2020-05-15 ENCOUNTER — Other Ambulatory Visit: Payer: Self-pay | Admitting: Internal Medicine

## 2020-05-15 DIAGNOSIS — E119 Type 2 diabetes mellitus without complications: Secondary | ICD-10-CM

## 2020-05-15 LAB — COMPREHENSIVE METABOLIC PANEL
ALT: 26 U/L (ref 0–53)
AST: 20 U/L (ref 0–37)
Albumin: 4.3 g/dL (ref 3.5–5.2)
Alkaline Phosphatase: 59 U/L (ref 39–117)
BUN: 18 mg/dL (ref 6–23)
CO2: 31 mEq/L (ref 19–32)
Calcium: 9.3 mg/dL (ref 8.4–10.5)
Chloride: 106 mEq/L (ref 96–112)
Creatinine, Ser: 0.99 mg/dL (ref 0.40–1.50)
GFR: 73.44 mL/min (ref >60.00)
Glucose, Bld: 167 mg/dL — ABNORMAL HIGH (ref 70–99)
Potassium: 4.5 mEq/L (ref 3.5–5.1)
Sodium: 139 mEq/L (ref 135–145)
Total Bilirubin: 0.7 mg/dL (ref 0.2–1.2)
Total Protein: 6.5 g/dL (ref 6.0–8.3)

## 2020-05-15 LAB — LIPID PANEL
Cholesterol: 110 mg/dL (ref 0–200)
HDL: 37.6 mg/dL — ABNORMAL LOW (ref >39.00)
LDL Cholesterol: 59 mg/dL (ref 0–99)
NonHDL: 72.14
Total CHOL/HDL Ratio: 3
Triglycerides: 65 mg/dL (ref 0.0–149.0)
VLDL: 13 mg/dL (ref 0.0–40.0)

## 2020-05-15 LAB — MICROALBUMIN / CREATININE URINE RATIO
Creatinine,U: 141.6 mg/dL
Microalb Creat Ratio: 0.7 mg/g (ref 0.0–30.0)
Microalb, Ur: 1.1 mg/dL (ref 0.0–1.9)

## 2020-05-15 LAB — HEMOGLOBIN A1C: Hgb A1c MFr Bld: 7 % — ABNORMAL HIGH (ref 4.6–6.5)

## 2020-05-17 ENCOUNTER — Other Ambulatory Visit: Payer: Self-pay | Admitting: Internal Medicine

## 2020-05-18 ENCOUNTER — Other Ambulatory Visit: Payer: Self-pay

## 2020-05-18 ENCOUNTER — Ambulatory Visit (INDEPENDENT_AMBULATORY_CARE_PROVIDER_SITE_OTHER): Payer: Medicare HMO

## 2020-05-18 ENCOUNTER — Encounter: Payer: Self-pay | Admitting: Internal Medicine

## 2020-05-18 ENCOUNTER — Ambulatory Visit (INDEPENDENT_AMBULATORY_CARE_PROVIDER_SITE_OTHER): Payer: Medicare HMO | Admitting: Internal Medicine

## 2020-05-18 ENCOUNTER — Ambulatory Visit: Payer: Medicare HMO

## 2020-05-18 VITALS — BP 132/76 | HR 55 | Temp 97.7°F | Resp 16 | Ht 70.0 in | Wt 211.0 lb

## 2020-05-18 DIAGNOSIS — M47816 Spondylosis without myelopathy or radiculopathy, lumbar region: Secondary | ICD-10-CM | POA: Diagnosis not present

## 2020-05-18 DIAGNOSIS — E785 Hyperlipidemia, unspecified: Secondary | ICD-10-CM | POA: Diagnosis not present

## 2020-05-18 DIAGNOSIS — M5441 Lumbago with sciatica, right side: Secondary | ICD-10-CM

## 2020-05-18 DIAGNOSIS — I10 Essential (primary) hypertension: Secondary | ICD-10-CM | POA: Diagnosis not present

## 2020-05-18 DIAGNOSIS — E1169 Type 2 diabetes mellitus with other specified complication: Secondary | ICD-10-CM

## 2020-05-18 DIAGNOSIS — E119 Type 2 diabetes mellitus without complications: Secondary | ICD-10-CM | POA: Diagnosis not present

## 2020-05-18 DIAGNOSIS — M5136 Other intervertebral disc degeneration, lumbar region: Secondary | ICD-10-CM | POA: Diagnosis not present

## 2020-05-18 DIAGNOSIS — M1611 Unilateral primary osteoarthritis, right hip: Secondary | ICD-10-CM | POA: Diagnosis not present

## 2020-05-18 MED ORDER — METFORMIN HCL ER 500 MG PO TB24: 500.0000 mg | ORAL_TABLET | Freq: Every day | ORAL | 2 refills | Status: DC

## 2020-05-18 MED ORDER — FUROSEMIDE 20 MG PO TABS
ORAL_TABLET | ORAL | 3 refills | Status: DC
Start: 2020-05-18 — End: 2020-08-10

## 2020-05-18 NOTE — Patient Instructions (Signed)
Your swelling is aggravated by the weather and by your blood pressure medication  You can use furosemide every 3 days if needed for swelling.  Take it in th emorning IT WILL MAKE YOU PEE A LOT!   TRIAL of metformin XR once daily for sugars. If your stomach pain returns,  Stop it and let me know .  Return in 3 months

## 2020-05-18 NOTE — Progress Notes (Signed)
Subjective:  Patient ID: Richard Hardy, male    DOB: [birth year]  Age: 74 y.o. MRN: [id number]  CC: The primary encounter diagnosis was Acute right-sided low back pain with right-sided sciatica. Diagnoses of Diabetes mellitus without complication ([place]), Essential hypertension, and Hyperlipidemia associated with type 2 diabetes mellitus ([place]) were also pertinent to this visit.  HPI Richard Hardy presents for  Follow up on hypertension managed with amlodipine   This visit occurred during the SARS-CoV-2 public health emergency.  Safety protocols were in place, including screening questions prior to the visit, additional usage of staff PPE, and extensive cleaning of exam room while observing appropriate contact time as indicated for disinfecting solutions.   Patient has received both doses of the available COVID 19 vaccine with possible complications. (see below cc)   Patient continues to mask when outside of the home except when walking in yard or at safe distances from others .  Patient denies any change in mood or development of unhealthy behaviors resuting from the pandemic's restriction of activities and socialization.    Cc:  He has been having right hip pain radiates to knee /lateral thigh.  Thigh started hurting much more after pushing a lawn mower for a few hours. States that the bilateral hip pain started after receiving the COVID 19 vaccine.  He has a history of bulging disk  Which caused severe radiating pain that resolved with treatment by a  chiropractor . The pain would occasionally radiate to the thigh .  2) Hypertension: patient checks blood pressure twice weekly at home.  Readings have been for the most part < 140/80 at rest . Patient is following a reduce salt diet most days and is taking medications as prescribed.  Has noted increased lower extremity edema for the last several weeks.  Denies orthopnea , chest pain and  shortness of breath    T2DM:  he  feels generally well,  But  is not  exercising regularly or trying to lose weight. Checking  blood sugars  once daily at variable times, usually only if she feels she may be having a hypoglycemic event. .  BS have been under 130 fasting and < 150 post prandially.  Denies any recent hypoglyemic events.  Taking   medications as directed but having GI upset from short acting metformin.   Following a carbohydrate modified diet 6 days per week. Denies numbness, burning and tingling of extremities. Appetite is good.   Outpatient Medications Prior to Visit  Medication Sig Dispense Refill  . amLODipine (NORVASC) 5 MG tablet TAKE 1 TABLET BY MOUTH EVERY DAY 90 tablet 3  . aspirin EC 81 MG tablet Take 1 tablet (81 mg total) by mouth daily. 90 tablet 4  . atorvastatin (LIPITOR) 20 MG tablet Take 1 tablet (20 mg total) by mouth daily. 90 tablet 3  . Blood Glucose Monitoring Suppl (ONETOUCH VERIO IQ SYSTEM) w/Device KIT Use to check blood sugars once daily. 1 kit 0  . CINNAMON PO Take 2 capsules by mouth daily.    . Coenzyme Q10 (COQ10) 200 MG CAPS Take 1 tablet by mouth daily.      . Fish Oil OIL Take by mouth daily.      . fluticasone (FLONASE) 50 MCG/ACT nasal spray Place into the nose at bedtime.      . hydrochlorothiazide (HYDRODIURIL) 25 MG tablet TAKE 1 TABLET BY MOUTH EVERY DAY 90 tablet 3  . ONETOUCH DELICA LANCETS 35H MISC Use to check blood sugars  once daily. 100 each 1  . ONETOUCH VERIO test strip USE TO CHECK SUGARS ONCE DAILY. 100 strip 1  . Turmeric 500 MG TABS Take 1 tablet by mouth daily.    . vitamin C (ASCORBIC ACID) 500 MG tablet Take 500 mg by mouth daily.     No facility-administered medications prior to visit.    Review of Systems;  Patient denies headache, fevers, malaise, unintentional weight loss, skin rash, eye pain, sinus congestion and sinus pain, sore throat, dysphagia,  hemoptysis , cough, dyspnea, wheezing, chest pain, palpitations, orthopnea, edema, abdominal pain, nausea, melena, diarrhea,  constipation, flank pain, dysuria, hematuria, urinary  Frequency, nocturia, numbness, tingling, seizures,  Focal weakness, Loss of consciousness,  Tremor, insomnia, depression, anxiety, and suicidal ideation.      Objective:  BP 132/76 (BP Location: Left Arm, Patient Position: Sitting, Cuff Size: Normal)   Pulse (!) 55   Temp 97.7 F (36.5 C) (Temporal)   Resp 16   Ht _0  (1.778 m)   Wt 211 lb (95.7 kg)   SpO2 97%   BMI 30.28 kg/m   BP Readings from Last 3 Encounters:  05/18/20 132/76  11/17/19 123/75  07/09/19 130/72    Wt Readings from Last 3 Encounters:  05/18/20 211 lb (95.7 kg)  11/17/19 202 lb 8 oz (91.9 kg)  07/09/19 202 lb 8 oz (91.9 kg)    General appearance: alert, cooperative and appears stated age Ears: normal TM's and external ear canals both ears Throat: lips, mucosa, and tongue normal; teeth and gums normal Neck: no adenopathy, no carotid bruit, supple, symmetrical, trachea midline and thyroid not enlarged, symmetric, no tenderness/mass/nodules Back: symmetric, no curvature. ROM normal. No CVA tenderness. Lungs: clear to auscultation bilaterally Heart: regular rate and rhythm, S1, S2 normal, no murmur, click, rub or gallop Abdomen: soft, non-tender; bowel sounds normal; no masses,  no organomegaly Pulses: 2+ and symmetric Skin: Skin color, texture, turgor normal. No rashes or lesions Lymph nodes: Cervical, supraclavicular, and axillary nodes normal.  Lab Results  Component Value Date   HGBA1C 7.0 (H) 05/15/2020   HGBA1C 6.7 (H) 11/10/2019   HGBA1C 6.5 05/11/2019    Lab Results  Component Value Date   CREATININE 0.99 05/15/2020   CREATININE 1.00 11/10/2019   CREATININE 0.98 05/11/2019    Lab Results  Component Value Date   WBC 8.0 07/12/2019   HGB 14.9 07/12/2019   HCT 43.1 07/12/2019   PLT 162 07/12/2019   GLUCOSE 167 (H) 05/15/2020   CHOL 110 05/15/2020   TRIG 65.0 05/15/2020   HDL 37.60 (L) 05/15/2020   LDLDIRECT 60.0 09/29/2017    LDLCALC 59 05/15/2020   ALT 26 05/15/2020   AST 20 05/15/2020   NA 139 05/15/2020   K 4.5 05/15/2020   CL 106 05/15/2020   CREATININE 0.99 05/15/2020   BUN 18 05/15/2020   CO2 31 05/15/2020   TSH 2.320 07/09/2019   PSA 0.32 11/10/2019   HGBA1C 7.0 (H) 05/15/2020   MICROALBUR 1.1 05/15/2020    No results found.  Assessment & Plan:   Problem List Items Addressed This Visit      Unprioritized   Acute right-sided low back pain with right-sided sciatica - Primary    Unclear if pain is secondary to lumbar spinal stenosis or DJD hip or both. He has minimal degenerative changes on hip films.  PT referral recommended.       Relevant Orders   DG Hip Unilat W OR W/O Pelvis 2-3 Views Right (  Completed)   DG Lumbar Spine Complete (Completed)   Diabetes mellitus without complication (HCC)    Currently well-controlled on current medications but not tolerating short acting metformin.    hemoglobin A1c is at goal of less than 7.0. changing metformin to XR as a trial . Patient is reminded to schedule an annual eye exam and foot exam is normal today. Patient has no microalbuminuria. Patient is tolerating statin therapy for CAD risk reduction .   ACE/ARB is contraindicated due to hyperkalemia.   Lab Results  Component Value Date   HGBA1C 7.0 (H) 05/15/2020   Lab Results  Component Value Date   MICROALBUR 1.1 05/15/2020   MICROALBUR 0.8 05/11/2019           Relevant Medications   metFORMIN (GLUCOPHAGE XR) 500 MG 24 hr tablet   Hyperlipidemia associated with type 2 diabetes mellitus (HCC)    LDL and triglycerides remain at goal on current medications without side effects or  liver enzymes elevation.  Continue  low dose lipitor   Lab Results  Component Value Date   CHOL 110 05/15/2020   HDL 37.60 (L) 05/15/2020   LDLCALC 59 05/15/2020   LDLDIRECT 60.0 09/29/2017   TRIG 65.0 05/15/2020   CHOLHDL 3 05/15/2020   Lab Results  Component Value Date   ALT 26 05/15/2020   AST 20  05/15/2020   ALKPHOS 59 05/15/2020   BILITOT 0.7 05/15/2020          Relevant Medications   metFORMIN (GLUCOPHAGE XR) 500 MG 24 hr tablet   Hypertension    Choices limited due to history of hyperkalemia with ARB/ACE trials,  And resting bradycardia.  Continue amlodipine and hctz. ,  Add furosemide every 3 days prn edema      Relevant Medications   furosemide (LASIX) 20 MG tablet      I am having Elliot Dally. Dea start on metFORMIN and furosemide. I am also having him maintain his CoQ10, Fish Oil, fluticasone, vitamin C, aspirin EC, CINNAMON PO, Turmeric, OneTouch Verio IQ System, OneTouch Delica Lancets 89Q, hydrochlorothiazide, atorvastatin, amLODipine, and OneTouch Verio.  Meds ordered this encounter  Medications  . metFORMIN (GLUCOPHAGE XR) 500 MG 24 hr tablet    Sig: Take 1 tablet (500 mg total) by mouth daily with breakfast.    Dispense:  30 tablet    Refill:  2  . furosemide (LASIX) 20 MG tablet    Sig: As needed for swelling  Every 3 days    Dispense:  30 tablet    Refill:  3   I provided  30 minutes of  face-to-face time during this encounter reviewing patient's current problems and past surgeries, labs and imaging studies, providing counseling on the above mentioned problems , and coordination  of care .  There are no discontinued medications.  Follow-up: Return in about 3 months (around 08/18/2020) for follow up diabetes.   Crecencio Mc, MD

## 2020-05-21 DIAGNOSIS — M5441 Lumbago with sciatica, right side: Secondary | ICD-10-CM | POA: Insufficient documentation

## 2020-05-21 NOTE — Assessment & Plan Note (Signed)
LDL and triglycerides remain at goal on current medications without side effects or  liver enzymes elevation.  Continue  low dose lipitor   Lab Results  Component Value Date   CHOL 110 05/15/2020   HDL 37.60 (L) 05/15/2020   LDLCALC 59 05/15/2020   LDLDIRECT 60.0 09/29/2017   TRIG 65.0 05/15/2020   CHOLHDL 3 05/15/2020   Lab Results  Component Value Date   ALT 26 05/15/2020   AST 20 05/15/2020   ALKPHOS 59 05/15/2020   BILITOT 0.7 05/15/2020     

## 2020-05-21 NOTE — Assessment & Plan Note (Signed)
Unclear if pain is secondary to lumbar spinal stenosis or DJD hip or both. He has minimal degenerative changes on hip films.  PT referral recommended.

## 2020-05-21 NOTE — Assessment & Plan Note (Signed)
Choices limited due to history of hyperkalemia with ARB/ACE trials,  And resting bradycardia.  Continue amlodipine and hctz. ,  Add furosemide every 3 days prn edema

## 2020-05-21 NOTE — Assessment & Plan Note (Signed)
Currently well-controlled on current medications but not tolerating short acting metformin.    hemoglobin A1c is at goal of less than 7.0. changing metformin to XR as a trial . Patient is reminded to schedule an annual eye exam and foot exam is normal today. Patient has no microalbuminuria. Patient is tolerating statin therapy for CAD risk reduction .   ACE/ARB is contraindicated due to hyperkalemia.   Lab Results  Component Value Date   HGBA1C 7.0 (H) 05/15/2020   Lab Results  Component Value Date   MICROALBUR 1.1 05/15/2020   MICROALBUR 0.8 05/11/2019

## 2020-05-23 DIAGNOSIS — G4733 Obstructive sleep apnea (adult) (pediatric): Secondary | ICD-10-CM | POA: Diagnosis not present

## 2020-05-25 DIAGNOSIS — G4733 Obstructive sleep apnea (adult) (pediatric): Secondary | ICD-10-CM | POA: Diagnosis not present

## 2020-05-25 DIAGNOSIS — J301 Allergic rhinitis due to pollen: Secondary | ICD-10-CM | POA: Diagnosis not present

## 2020-05-28 DIAGNOSIS — R69 Illness, unspecified: Secondary | ICD-10-CM | POA: Diagnosis not present

## 2020-05-31 DIAGNOSIS — M9905 Segmental and somatic dysfunction of pelvic region: Secondary | ICD-10-CM | POA: Diagnosis not present

## 2020-05-31 DIAGNOSIS — M9903 Segmental and somatic dysfunction of lumbar region: Secondary | ICD-10-CM | POA: Diagnosis not present

## 2020-05-31 DIAGNOSIS — M9902 Segmental and somatic dysfunction of thoracic region: Secondary | ICD-10-CM | POA: Diagnosis not present

## 2020-05-31 DIAGNOSIS — M5442 Lumbago with sciatica, left side: Secondary | ICD-10-CM | POA: Diagnosis not present

## 2020-05-31 DIAGNOSIS — M955 Acquired deformity of pelvis: Secondary | ICD-10-CM | POA: Diagnosis not present

## 2020-05-31 DIAGNOSIS — M546 Pain in thoracic spine: Secondary | ICD-10-CM | POA: Diagnosis not present

## 2020-06-20 DIAGNOSIS — M9902 Segmental and somatic dysfunction of thoracic region: Secondary | ICD-10-CM | POA: Diagnosis not present

## 2020-06-20 DIAGNOSIS — M9903 Segmental and somatic dysfunction of lumbar region: Secondary | ICD-10-CM | POA: Diagnosis not present

## 2020-06-20 DIAGNOSIS — M9905 Segmental and somatic dysfunction of pelvic region: Secondary | ICD-10-CM | POA: Diagnosis not present

## 2020-06-20 DIAGNOSIS — M546 Pain in thoracic spine: Secondary | ICD-10-CM | POA: Diagnosis not present

## 2020-06-20 DIAGNOSIS — M955 Acquired deformity of pelvis: Secondary | ICD-10-CM | POA: Diagnosis not present

## 2020-06-20 DIAGNOSIS — M5442 Lumbago with sciatica, left side: Secondary | ICD-10-CM | POA: Diagnosis not present

## 2020-07-03 DIAGNOSIS — G4733 Obstructive sleep apnea (adult) (pediatric): Secondary | ICD-10-CM | POA: Diagnosis not present

## 2020-07-04 DIAGNOSIS — G4733 Obstructive sleep apnea (adult) (pediatric): Secondary | ICD-10-CM | POA: Diagnosis not present

## 2020-07-12 ENCOUNTER — Ambulatory Visit (INDEPENDENT_AMBULATORY_CARE_PROVIDER_SITE_OTHER): Payer: Medicare HMO

## 2020-07-12 VITALS — Ht 70.0 in | Wt 211.0 lb

## 2020-07-12 DIAGNOSIS — Z0000 Encounter for general adult medical examination without abnormal findings: Secondary | ICD-10-CM | POA: Diagnosis not present

## 2020-07-12 NOTE — Patient Instructions (Addendum)
[name] , Thank you for taking time to come for your Medicare Wellness Visit. I appreciate your ongoing commitment to your health goals. Please review the following plan we discussed and let me know if I can assist you in the future.   These are the goals we discussed: Goals      Patient Stated     Weight (lb) < 200 lb (90.7 kg) (pt-stated)      Lose about 10 lbs Lower A1C Decrease the need for blood pressure medication through diet and exercise        This is a list of the screening recommended for you and due dates:  Health Maintenance  Topic Date Due   Complete foot exam   05/10/2020   Flu Shot  07/09/2020   Hemoglobin A1C  11/14/2020   Eye exam for diabetics  01/16/2021   Urine Protein Check  05/15/2021   Tetanus Vaccine  06/21/2023   COVID-19 Vaccine  Completed    Hepatitis C: One time screening is recommended by Center for Disease Control  (CDC) for  adults born from [birth year] through 1967.   Completed    Immunizations Immunization History  Administered Date(s) Administered   Influenza, High Dose Seasonal PF 11/20/2016, 09/29/2017, 09/30/2018, 10/07/2019   Influenza,inj,Quad PF,6+ Mos 08/26/2014, 08/29/2015   Influenza-Unspecified 09/20/2012, 09/24/2013   PFIZER SARS-COV-2 Vaccination 01/03/2020, 01/24/2020   Pneumococcal Conjugate-13 05/25/2014   Pneumococcal Polysaccharide-23 07/21/2012, 09/30/2018   Tdap 06/20/2013   Zoster 06/20/2013   Keep all routine maintenance appointments.   Follow up 08/21/20 @ 8:30  Advanced directives: yes, on file  Conditions/risks identified: none new  Follow up in one year for your annual wellness visit.   Preventive Care 76 Years and Older, Male Preventive care refers to lifestyle choices and visits with your health care provider that can promote health and wellness. What does preventive care include?  A yearly physical exam. This is also called an annual well check.  Dental exams once or twice a  year.  Routine eye exams. Ask your health care provider how often you should have your eyes checked.  Personal lifestyle choices, including:  Daily care of your teeth and gums.  Regular physical activity.  Eating a healthy diet.  Avoiding tobacco and drug use.  Limiting alcohol use.  Practicing safe sex.  Taking low doses of aspirin every day.  Taking vitamin and mineral supplements as recommended by your health care provider. What happens during an annual well check? The services and screenings done by your health care provider during your annual well check will depend on your age, overall health, lifestyle risk factors, and family history of disease. Counseling  Your health care provider may ask you questions about your:  Alcohol use.  Tobacco use.  Drug use.  Emotional well-being.  Home and relationship well-being.  Sexual activity.  Eating habits.  History of falls.  Memory and ability to understand (cognition).  Work and work Statistician. Screening  You may have the following tests or measurements:  Height, weight, and BMI.  Blood pressure.  Lipid and cholesterol levels. These may be checked every 5 years, or more frequently if you are over 36 years old.  Skin check.  Lung cancer screening. You may have this screening every year starting at age 68 if you have a 30-pack-year history of smoking and currently smoke or have quit within the past 15 years.  Fecal occult blood test (FOBT) of the stool. You may have this test every year  starting at age 75.  Flexible sigmoidoscopy or colonoscopy. You may have a sigmoidoscopy every 5 years or a colonoscopy every 10 years starting at age 75.  Prostate cancer screening. Recommendations will vary depending on your family history and other risks.  Hepatitis C blood test.  Hepatitis B blood test.  Sexually transmitted disease (STD) testing.  Diabetes screening. This is done by checking your blood sugar  (glucose) after you have not eaten for a while (fasting). You may have this done every 1-3 years.  Abdominal aortic aneurysm (AAA) screening. You may need this if you are a current or former smoker.  Osteoporosis. You may be screened starting at age 28 if you are at high risk. Talk with your health care provider about your test results, treatment options, and if necessary, the need for more tests. Vaccines  Your health care provider may recommend certain vaccines, such as:  Influenza vaccine. This is recommended every year.  Tetanus, diphtheria, and acellular pertussis (Tdap, Td) vaccine. You may need a Td booster every 10 years.  Zoster vaccine. You may need this after age 67.  Pneumococcal 13-valent conjugate (PCV13) vaccine. One dose is recommended after age 8.  Pneumococcal polysaccharide (PPSV23) vaccine. One dose is recommended after age 19. Talk to your health care provider about which screenings and vaccines you need and how often you need them. This information is not intended to replace advice given to you by your health care provider. Make sure you discuss any questions you have with your health care provider. Document Released: 12/22/2015 Document Revised: 08/14/2016 Document Reviewed: 09/26/2015 Elsevier Interactive Patient Education  2017 [employer] Prevention in the Home Falls can cause injuries. They can happen to people of all ages. There are many things you can do to make your home safe and to help prevent falls. What can I do on the outside of my home?  Regularly fix the edges of walkways and driveways and fix any cracks.  Remove anything that might make you trip as you walk through a door, such as a raised step or threshold.  Trim any bushes or trees on the path to your home.  Use bright outdoor lighting.  Clear any walking paths of anything that might make someone trip, such as rocks or tools.  Regularly check to see if handrails are loose or  broken. Make sure that both sides of any steps have handrails.  Any raised decks and porches should have guardrails on the edges.  Have any leaves, snow, or ice cleared regularly.  Use sand or salt on walking paths during winter.  Clean up any spills in your garage right away. This includes oil or grease spills. What can I do in the bathroom?  Use night lights.  Install grab bars by the toilet and in the tub and shower. Do not use towel bars as grab bars.  Use non-skid mats or decals in the tub or shower.  If you need to sit down in the shower, use a plastic, non-slip stool.  Keep the floor dry. Clean up any water that spills on the floor as soon as it happens.  Remove soap buildup in the tub or shower regularly.  Attach bath mats securely with double-sided non-slip rug tape.  Do not have throw rugs and other things on the floor that can make you trip. What can I do in the bedroom?  Use night lights.  Make sure that you have a light by your bed that is  easy to reach.  Do not use any sheets or blankets that are too big for your bed. They should not hang down onto the floor.  Have a firm chair that has side arms. You can use this for support while you get dressed.  Do not have throw rugs and other things on the floor that can make you trip. What can I do in the kitchen?  Clean up any spills right away.  Avoid walking on wet floors.  Keep items that you use a lot in easy-to-reach places.  If you need to reach something above you, use a strong step stool that has a grab bar.  Keep electrical cords out of the way.  Do not use floor polish or wax that makes floors slippery. If you must use wax, use non-skid floor wax.  Do not have throw rugs and other things on the floor that can make you trip. What can I do with my stairs?  Do not leave any items on the stairs.  Make sure that there are handrails on both sides of the stairs and use them. Fix handrails that are  broken or loose. Make sure that handrails are as long as the stairways.  Check any carpeting to make sure that it is firmly attached to the stairs. Fix any carpet that is loose or worn.  Avoid having throw rugs at the top or bottom of the stairs. If you do have throw rugs, attach them to the floor with carpet tape.  Make sure that you have a light switch at the top of the stairs and the bottom of the stairs. If you do not have them, ask someone to add them for you. What else can I do to help prevent falls?  Wear shoes that:  Do not have high heels.  Have rubber bottoms.  Are comfortable and fit you well.  Are closed at the toe. Do not wear sandals.  If you use a stepladder:  Make sure that it is fully opened. Do not climb a closed stepladder.  Make sure that both sides of the stepladder are locked into place.  Ask someone to hold it for you, if possible.  Clearly mark and make sure that you can see:  Any grab bars or handrails.  First and last steps.  Where the edge of each step is.  Use tools that help you move around (mobility aids) if they are needed. These include:  Canes.  Walkers.  Scooters.  Crutches.  Turn on the lights when you go into a dark area. Replace any light bulbs as soon as they burn out.  Set up your furniture so you have a clear path. Avoid moving your furniture around.  If any of your floors are uneven, fix them.  If there are any pets around you, be aware of where they are.  Review your medicines with your doctor. Some medicines can make you feel dizzy. This can increase your chance of falling. Ask your doctor what other things that you can do to help prevent falls. This information is not intended to replace advice given to you by your health care provider. Make sure you discuss any questions you have with your health care provider. Document Released: 09/21/2009 Document Revised: 05/02/2016 Document Reviewed: 12/30/2014 Elsevier  Interactive Patient Education  2017 [employer].

## 2020-07-12 NOTE — Progress Notes (Addendum)
Subjective:   Richard Hardy is a 74 y.o. male who presents for Medicare Annual/Subsequent preventive examination.  Review of Systems    No ROS.  Medicare Wellness Virtual Visit.   Cardiac Risk Factors include: advanced age (>64mn, >>59women);hypertension;male gender;diabetes mellitus     Objective:    Today's Vitals   07/12/20 1407  Weight: 211 lb (95.7 kg)  Height: _0  (1.778 m)   Body mass index is 30.28 kg/m.  Advanced Directives 07/12/2020 07/12/2019 04/29/2018 06/23/2017 04/28/2017  Does Patient Have a Medical Advance Directive? _1   Type of AParamedicof [place] will [place] will [place] will - [place] will  Does patient want to make changes to medical advance directive? No - Patient declined No - Patient declined No - Patient declined - No - Patient declined  Copy of [place] Chart? Yes - validated most recent copy scanned in chart (See row information) Yes - validated most recent copy scanned in chart (See row information) Yes - Yes    Current Medications (verified) Outpatient Encounter Medications as of 07/12/2020  Medication Sig   amLODipine (NORVASC) 5 MG tablet TAKE 1 TABLET BY MOUTH EVERY DAY   aspirin EC 81 MG tablet Take 1 tablet (81 mg total) by mouth daily.   atorvastatin (LIPITOR) 20 MG tablet Take 1 tablet (20 mg total) by mouth daily.   Blood Glucose Monitoring Suppl (ONETOUCH VERIO IQ SYSTEM) w/Device KIT Use to check blood sugars once daily.   CINNAMON PO Take 2 capsules by mouth daily.   Coenzyme Q10 (COQ10) 200 MG CAPS Take 1 tablet by mouth daily.     Fish Oil OIL Take by mouth daily.     fluticasone (FLONASE) 50 MCG/ACT nasal spray Place into the nose at bedtime.     furosemide (LASIX) 20 MG tablet As needed for swelling  Every 3 days   hydrochlorothiazide (HYDRODIURIL) 25 MG tablet TAKE 1 TABLET BY  MOUTH EVERY DAY   metFORMIN (GLUCOPHAGE XR) 500 MG 24 hr tablet Take 1 tablet (500 mg total) by mouth daily with breakfast.   ONETOUCH DELICA LANCETS 300QMISC Use to check blood sugars once daily.   ONETOUCH VERIO test strip USE TO CHECK SUGARS ONCE DAILY.   Turmeric 500 MG TABS Take 1 tablet by mouth daily.   vitamin C (ASCORBIC ACID) 500 MG tablet Take 500 mg by mouth daily.   No facility-administered encounter medications on file as of 07/12/2020.    Allergies (verified) Losartan, Lisinopril, Metformin and related, Sulfonamide derivatives, and Augmentin [amoxicillin-pot clavulanate]   History: Past Medical History:  Diagnosis Date   Diabetes mellitus without complication ([place] [address]  Glucose intolerance (impaired glucose tolerance)    Hyperlipidemia    Obesity    Sleep apnea    On BiPAP, Dr. BRichardson Landry  Thrombocythemia ([facility]    Dr. FGrayland Ormond  Past Surgical History:  Procedure Laterality Date   CATARACT EXTRACTION, BILATERAL     COLONOSCOPY WITH PROPOFOL N/A 06/23/2017   Procedure: COLONOSCOPY WITH PROPOFOL;  Surgeon: EManya Silvas MD;  [facility]  Service: Endoscopy;  Laterality: N/A;   EYE SURGERY     LITHOTRIPSY     Dr. SBernardo Heater  NASAL SEPTUM SURGERY     Family History  Problem Relation Age of Onset   Cancer Mother        brain   Heart  attack Father 38   Heart disease Father    Heart disease Brother        s/p CABG   Crohn's disease Brother    Hyperlipidemia Son    Hypertension Son    Sleep apnea Son    Thyroid disease Son    Heart disease Paternal Grandfather    Social History   Socioeconomic History   Marital status: Married    Spouse name: Not on file   Number of children: Not on file   Years of education: Not on file   Highest education level: Not on file  Occupational History   Not on file  Tobacco Use   Smoking status: Former Smoker    Packs/day: 0.50    Years: 4.00    Pack years: 2.00    Quit date: 12/10/1963    Years since  quitting: 56.6   Smokeless tobacco: Never Used  Vaping Use   Vaping Use: Never used  Substance and Sexual Activity   Alcohol use: Yes    Comment: Occasional   Drug use: No   Sexual activity: Not on file  Other Topics Concern   Not on file  Social History Narrative   Lives in [place] with wife. 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      Diet - regular, healthy      Exercise - walks with work and goes to the [employer] Strain: Low Risk    Difficulty of Paying Living Expenses: Not hard at [employer] Insecurity: No Food Insecurity   Worried About Charity fundraiser in the Last Year: Never true   Arboriculturist in the Last Year: Never true  Transportation Needs: No Transportation Needs   Lack of Transportation (Medical): No   Lack of Transportation (Non-Medical): No  Physical Activity: Sufficiently Active   Days of Exercise per Week: 5 days   Minutes of Exercise per Session: 30 min  Stress: No Stress Concern Present   Feeling of Stress : Only a little  Social Connections:    Frequency of Communication with Friends and Family:    Frequency of Social Gatherings with Friends and Family:    Attends Religious Services:    Active Member of Clubs or Organizations:    Attends Music therapist:    Marital Status:     Tobacco Counseling Counseling given: Not Answered   Clinical Intake:  Pre-visit preparation completed: Yes        Diabetes: Yes (Follow up with pcp)  How often do you need to have someone help you when you read instructions, pamphlets, or other written materials from your doctor or pharmacy?: 1 - Never   Interpreter Needed?: No      Activities of Daily Living In your present state of health, do you have any difficulty performing the following activities: 07/12/2020  Hearing? N  Vision? N  Difficulty concentrating or making decisions? N  Walking or  climbing stairs? N  Dressing or bathing? N  Doing errands, shopping? N  Preparing Food and eating ? N  Using the Toilet? N  In the past six months, have you accidently leaked urine? N  Do you have problems with loss of bowel control? N  Managing your Medications? N  Managing your Finances? N  Housekeeping or managing your Housekeeping? N  Some recent data might be hidden  Patient Care Team: Crecencio Mc, MD as PCP - General (Internal Medicine) Minna Merritts, MD as PCP - Cardiology (Cardiology) Jackolyn Confer, MD (Internal Medicine) Minna Merritts, MD as Consulting Physician (Cardiology)  Indicate any recent Medical Services you may have received from other than [facility] providers in the past year (date may be approximate).     Assessment:   This is a routine wellness examination for [facility].  I connected with Hillard today by telephone and verified that I am speaking with the correct person using two identifiers. Location patient: home Location provider: work Persons participating in the virtual visit: patient, Marine scientist.    I discussed the limitations, risks, security and privacy concerns of performing an evaluation and management service by telephone and the availability of in person appointments. The patient expressed understanding and verbally consented to this telephonic visit.    Interactive audio and video telecommunications were attempted between this provider and patient, however failed, due to patient having technical difficulties OR patient did not have access to video capability.  We continued and completed visit with audio only.  Some vital signs may be absent or patient reported.   Hearing/Vision screen  Hearing Screening   _0  _1  _2  _3  _4  _5  _6  _7  _8   Right ear:           Left ear:           Comments: Patient is able to hear conversational tones without difficulty.  No issues reported.  Vision Screening Comments: Followed  by [facility] Wears corrective lenses Cataract extraction, bilateral Visual acuity not assessed, virtual visit.  They have seen their ophthalmologist in the last 12 months.    Dietary issues and exercise activities discussed: Current Exercise Habits: Home exercise routine, Type of exercise: walking (golfing), Time (Minutes): 30, Frequency (Times/Week): 5, Weekly Exercise (Minutes/Week): 150, Intensity: Mild  Modified carb diet Good water intake Caffeine- 1 cup of coffee  Goals       Patient Stated     Weight (lb) < 200 lb (90.7 kg) (pt-stated)      Lose about 10 lbs Lower A1C Decrease the need for blood pressure medication through diet and exercise        Depression Screen PHQ 2/9 Scores 07/12/2020 07/12/2019 04/29/2018 04/28/2017 05/20/2016 08/29/2015 02/22/2015  PHQ - 2 Score 0 0 0 0 0 0 0  PHQ- 9 Score - - - 0 - - -    Fall Risk Fall Risk  07/12/2020 05/18/2020 11/17/2019 07/12/2019 04/29/2018  Falls in the past year? 0 0 0 0 No  Number falls in past yr: 0 - - - -  Follow up Falls evaluation completed Falls evaluation completed Falls evaluation completed - -   Handrails in use when climbing stairs? Yes  Home free of loose throw rugs in walkways, pet beds, electrical cords, etc? Yes  Adequate lighting in your home to reduce risk of falls? Yes   ASSISTIVE DEVICES UTILIZED TO PREVENT FALLS:  Life alert? No  Use of a cane, walker or w/c? No  Grab bars in the bathroom? No  Shower chair or bench in shower? No  Elevated toilet seat or a handicapped toilet? No   TIMED UP AND GO:  Was the test performed? No . Virtual visit.   Cognitive Function: MMSE - Mini Mental State Exam 04/29/2018 04/28/2017  Orientation to time 5 5  Orientation to Place 5 5  Registration 3 3  Attention/ Calculation 5 5  Recall 3  3  Language- name 2 objects 2 2  Language- repeat 1 1  Language- follow 3 step command 3 3  Language- read & follow direction 1 1  Write a sentence 1 1  Copy design 1 1   Total score 30 30     6CIT Screen 07/12/2020 07/12/2019  What Year? 0 points 0 points  What month? 0 points 0 points  What time? - 0 points  Count back from 20 - 0 points  Months in reverse 0 points 0 points  Repeat phrase - 0 points  Total Score - 0   Immunizations Immunization History  Administered Date(s) Administered   Influenza, High Dose Seasonal PF 11/20/2016, 09/29/2017, 09/30/2018, 10/07/2019   Influenza,inj,Quad PF,6+ Mos 08/26/2014, 08/29/2015   Influenza-Unspecified 09/20/2012, 09/24/2013   PFIZER SARS-COV-2 Vaccination 01/03/2020, 01/24/2020   Pneumococcal Conjugate-13 05/25/2014   Pneumococcal Polysaccharide-23 07/21/2012, 09/30/2018   Tdap 06/20/2013   Zoster 06/20/2013   Health Maintenance Health Maintenance  Topic Date Due   FOOT EXAM  05/10/2020   INFLUENZA VACCINE  07/09/2020   HEMOGLOBIN A1C  11/14/2020   OPHTHALMOLOGY EXAM  01/16/2021   URINE MICROALBUMIN  05/15/2021   TETANUS/TDAP  06/21/2023   COVID-19 Vaccine  Completed   Hepatitis C Screening  Completed    Dental Screening: Recommended annual dental exams for proper oral hygiene  Community Resource Referral / Chronic Care Management: CRR required this visit?  No   CCM required this visit?  No     Plan:   Keep all routine maintenance appointments.   Follow up 08/21/20 @ 8:30  I have personally reviewed and noted the following in the patient's chart:   Medical and social history Use of alcohol, tobacco or illicit drugs  Current medications and supplements Functional ability and status Nutritional status Physical activity Advanced directives List of other physicians Hospitalizations, surgeries, and ER visits in previous 12 months Vitals Screenings to include cognitive, depression, and falls Referrals and appointments  In addition, I have reviewed and discussed with patient certain preventive protocols, quality metrics, and best practice recommendations. A written personalized care  plan for preventive services as well as general preventive health recommendations were provided to patient via mychart.     OBrien-Blaney, Janasia Coverdale L, LPN   06/13/7208     I have reviewed the above information and agree with above.   Deborra Medina, MD

## 2020-07-13 DIAGNOSIS — R69 Illness, unspecified: Secondary | ICD-10-CM | POA: Diagnosis not present

## 2020-07-17 NOTE — Progress Notes (Signed)
Patient ID: Richard Hardy, male   DOB: [birth year], 74 y.o.   MRN: [id number] Cardiology Office Note  Date:  07/18/2020   ID:  Richard Hardy, Richard Hardy 1946-06-17, MRN [id number]  PCP:  Crecencio Mc, MD   Chief Complaint  Patient presents with  . office visit    12 month F/U; Meds verbally reviewed with patient.    HPI:  [name] is a 74 year old male with a history of  hyperlipidemia,  glucose intolerance, obstructive sleep apnea on CPAP,  Borderline diabetes,  Asymptomatic bradycardia strong family history of coronary artery disease  who returns for routine followup of his hyperlipidemia. 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   previously worked  at [employer], long hours Denies chest pain or shortness of breath  Plays golf, active On CPAP, new machine  Labs reviewed Hemoglobin A1c 7.0  Total chol 106, LDL 55  EKG personally reviewed by myself on todays visit Sinus bradycardia rate 49 bpm  Other past medical history seen by Dr. Grayland Ormond of hematology oncology for prior drop in his platelets. Aspirin was held through this.    PMH:   has a past medical history of Diabetes mellitus without complication ([place]) (8546), Glucose intolerance (impaired glucose tolerance), Hyperlipidemia, Obesity, Sleep apnea, and Thrombocythemia ([place]).  PSH:    Past Surgical History:  Procedure Laterality Date  . CATARACT EXTRACTION, BILATERAL    . COLONOSCOPY WITH PROPOFOL N/A 06/23/2017   Procedure: COLONOSCOPY WITH PROPOFOL;  Surgeon: Manya Silvas, MD;  Location[facility];  Service: Endoscopy;  Laterality: N/A;  . EYE SURGERY    . LITHOTRIPSY     Dr. Bernardo Heater  . NASAL SEPTUM SURGERY      Current Outpatient Medications  Medication Sig Dispense Refill  . amLODipine (NORVASC) 5 MG tablet TAKE 1 TABLET BY MOUTH EVERY DAY 90 tablet 3  . aspirin EC 81 MG tablet Take 1 tablet (81 mg total) by mouth daily. 90 tablet 4  . atorvastatin (LIPITOR) 20 MG tablet  Take 1 tablet (20 mg total) by mouth daily. 90 tablet 3  . Blood Glucose Monitoring Suppl (ONETOUCH VERIO IQ SYSTEM) w/Device KIT Use to check blood sugars once daily. 1 kit 0  . CINNAMON PO Take 2 capsules by mouth daily.    . Coenzyme Q10 (COQ10) 200 MG CAPS Take 1 tablet by mouth daily.      . Fish Oil OIL Take by mouth daily.      . fluticasone (FLONASE) 50 MCG/ACT nasal spray Place into the nose at bedtime.      . furosemide (LASIX) 20 MG tablet As needed for swelling  Every 3 days 30 tablet 3  . glucosamine-chondroitin 500-400 MG tablet Take 1 tablet by mouth in the morning and at bedtime.    . hydrochlorothiazide (HYDRODIURIL) 25 MG tablet TAKE 1 TABLET BY MOUTH EVERY DAY 90 tablet 3  . MAGNESIUM CITRATE PO Take 1 capsule by mouth in the morning and at bedtime.    . metFORMIN (GLUCOPHAGE XR) 500 MG 24 hr tablet Take 1 tablet (500 mg total) by mouth daily with breakfast. 30 tablet 2  . Multiple Vitamin (MULTIVITAMIN) capsule Take 1 capsule by mouth daily.    Glory Rosebush DELICA LANCETS 27O MISC Use to check blood sugars once daily. 100 each 1  . ONETOUCH VERIO test strip USE TO CHECK SUGARS ONCE DAILY. 100 strip 1  . Turmeric 500 MG TABS Take 1 tablet by mouth daily.    Marland Kitchen  vitamin C (ASCORBIC ACID) 500 MG tablet Take 500 mg by mouth daily.     No current facility-administered medications for this visit.     Allergies:   Losartan, Lisinopril, Metformin and related, Sulfonamide derivatives, and Augmentin [amoxicillin-pot clavulanate]   Social History:  The patient  reports that he quit smoking about 56 years ago. He has a 2.00 pack-year smoking history. He has never used smokeless tobacco. He reports current alcohol use. He reports that he does not use drugs.   Family History:   family history includes Cancer in his mother; Crohn's disease in his brother; Heart attack (age of onset: 63) in his father; Heart disease in his brother, father, and paternal grandfather; Hyperlipidemia in his son;  Hypertension in his son; Sleep apnea in his son; Thyroid disease in his son.    Review of Systems: Review of Systems  Constitutional: Negative.   HENT: Negative.   Respiratory: Negative.   Cardiovascular: Negative.   Gastrointestinal: Negative.   Musculoskeletal: Negative.   Neurological: Negative.   Psychiatric/Behavioral: Negative.   All other systems reviewed and are negative.   PHYSICAL EXAM: VS:  BP (!) 144/84 (BP Location: Left Arm, Patient Position: Sitting, Cuff Size: Normal)   Pulse (!) 49   Ht 5' 10"  (1.778 m)   Wt 210 lb 4 oz (95.4 kg)   SpO2 97%   BMI 30.17 kg/m  , BMI Body mass index is 30.17 kg/m. Constitutional:  oriented to person, place, and time. No distress.  HENT:  Head: Grossly normal Eyes:  no discharge. No scleral icterus.  Neck: No JVD, no carotid bruits  Cardiovascular: Regular rate and rhythm, no murmurs appreciated Pulmonary/Chest: Clear to auscultation bilaterally, no wheezes or rails Abdominal: Soft.  no distension.  no tenderness.  Musculoskeletal: Normal range of motion Neurological:  normal muscle tone. Coordination normal. No atrophy Skin: Skin warm and dry Psychiatric: normal affect, pleasant   Recent Labs: 05/15/2020: ALT 26; BUN 18; Creatinine, Ser 0.99; Potassium 4.5; Sodium 139    Lipid Panel Lab Results  Component Value Date   CHOL 110 05/15/2020   HDL 37.60 (L) 05/15/2020   LDLCALC 59 05/15/2020   TRIG 65.0 05/15/2020      Wt Readings from Last 3 Encounters:  07/18/20 210 lb 4 oz (95.4 kg)  07/12/20 211 lb (95.7 kg)  05/18/20 211 lb (95.7 kg)       ASSESSMENT AND PLAN:  Essential hypertension - Plan: EKG 12-Lead We will continue to monitor, long walk into the office, stressful screening to get in, minimally elevated, will have him monitor at home  Hyperlipemia Numbers at goal given aortic atherosclerosis on x-ray  Controlled type 2 diabetes mellitus without complication, without long-term current use of insulin  (HCC)  Hemoglobin A1c trending upwards now 7 Discussed various diet changes, increase his walking  Bradycardia -  Asymptomatic bradycardia Monitor for now  Aortic atherosclerosis Noted on x-ray Discussed with him in detail, recommend he continue to work on his diabetes, cholesterol at goal, he is a non-smoker  Chronic low back pain Having some nerve pain down right leg He attributes this to the vaccine X-ray reviewed with him, he does have some DJD, discussed with him   Total encounter time more than 25 minutes  Greater than 50% was spent in counseling and coordination of care with the patient      Orders Placed This Encounter  Procedures  . EKG 12-Lead     Signed, Esmond Plants, M.D., Ph.D. 07/18/2020  Nad[place], [place]

## 2020-07-18 ENCOUNTER — Encounter: Payer: Self-pay | Admitting: Cardiovascular Disease

## 2020-07-18 ENCOUNTER — Other Ambulatory Visit: Payer: Self-pay

## 2020-07-18 ENCOUNTER — Ambulatory Visit: Payer: Medicare HMO | Admitting: Cardiovascular Disease

## 2020-07-18 VITALS — BP 144/84 | HR 49 | Ht 70.0 in | Wt 210.2 lb

## 2020-07-18 DIAGNOSIS — R001 Bradycardia, unspecified: Secondary | ICD-10-CM

## 2020-07-18 DIAGNOSIS — E1169 Type 2 diabetes mellitus with other specified complication: Secondary | ICD-10-CM

## 2020-07-18 DIAGNOSIS — E785 Hyperlipidemia, unspecified: Secondary | ICD-10-CM

## 2020-07-18 DIAGNOSIS — I10 Essential (primary) hypertension: Secondary | ICD-10-CM | POA: Diagnosis not present

## 2020-07-18 NOTE — Patient Instructions (Signed)

## 2020-07-19 DIAGNOSIS — M5442 Lumbago with sciatica, left side: Secondary | ICD-10-CM | POA: Diagnosis not present

## 2020-07-19 DIAGNOSIS — M546 Pain in thoracic spine: Secondary | ICD-10-CM | POA: Diagnosis not present

## 2020-07-19 DIAGNOSIS — M955 Acquired deformity of pelvis: Secondary | ICD-10-CM | POA: Diagnosis not present

## 2020-07-19 DIAGNOSIS — M9903 Segmental and somatic dysfunction of lumbar region: Secondary | ICD-10-CM | POA: Diagnosis not present

## 2020-07-19 DIAGNOSIS — M9905 Segmental and somatic dysfunction of pelvic region: Secondary | ICD-10-CM | POA: Diagnosis not present

## 2020-07-19 DIAGNOSIS — M9902 Segmental and somatic dysfunction of thoracic region: Secondary | ICD-10-CM | POA: Diagnosis not present

## 2020-08-03 DIAGNOSIS — G4733 Obstructive sleep apnea (adult) (pediatric): Secondary | ICD-10-CM | POA: Diagnosis not present

## 2020-08-09 ENCOUNTER — Other Ambulatory Visit: Payer: Self-pay | Admitting: Internal Medicine

## 2020-08-10 ENCOUNTER — Other Ambulatory Visit: Payer: Self-pay | Admitting: Internal Medicine

## 2020-08-16 DIAGNOSIS — R69 Illness, unspecified: Secondary | ICD-10-CM | POA: Diagnosis not present

## 2020-08-21 ENCOUNTER — Encounter: Payer: Self-pay | Admitting: Internal Medicine

## 2020-08-21 ENCOUNTER — Other Ambulatory Visit: Payer: Self-pay

## 2020-08-21 ENCOUNTER — Ambulatory Visit (INDEPENDENT_AMBULATORY_CARE_PROVIDER_SITE_OTHER): Payer: Medicare HMO | Admitting: Internal Medicine

## 2020-08-21 VITALS — BP 114/62 | HR 43 | Temp 97.7°F | Resp 15 | Ht 70.0 in | Wt 208.6 lb

## 2020-08-21 DIAGNOSIS — I10 Essential (primary) hypertension: Secondary | ICD-10-CM | POA: Diagnosis not present

## 2020-08-21 DIAGNOSIS — I700 Atherosclerosis of aorta: Secondary | ICD-10-CM | POA: Diagnosis not present

## 2020-08-21 DIAGNOSIS — E119 Type 2 diabetes mellitus without complications: Secondary | ICD-10-CM | POA: Diagnosis not present

## 2020-08-21 DIAGNOSIS — Z23 Encounter for immunization: Secondary | ICD-10-CM

## 2020-08-21 DIAGNOSIS — M5441 Lumbago with sciatica, right side: Secondary | ICD-10-CM

## 2020-08-21 DIAGNOSIS — E1169 Type 2 diabetes mellitus with other specified complication: Secondary | ICD-10-CM | POA: Diagnosis not present

## 2020-08-21 DIAGNOSIS — E785 Hyperlipidemia, unspecified: Secondary | ICD-10-CM

## 2020-08-21 DIAGNOSIS — I872 Venous insufficiency (chronic) (peripheral): Secondary | ICD-10-CM

## 2020-08-21 LAB — COMPREHENSIVE METABOLIC PANEL
ALT: 31 U/L (ref 0–53)
AST: 25 U/L (ref 0–37)
Albumin: 4.6 g/dL (ref 3.5–5.2)
Alkaline Phosphatase: 60 U/L (ref 39–117)
BUN: 18 mg/dL (ref 6–23)
CO2: 31 mEq/L (ref 19–32)
Calcium: 9.7 mg/dL (ref 8.4–10.5)
Chloride: 103 mEq/L (ref 96–112)
Creatinine, Ser: 1.1 mg/dL (ref 0.40–1.50)
GFR: 64.98 mL/min (ref >60.00)
Glucose, Bld: 161 mg/dL — ABNORMAL HIGH (ref 70–99)
Potassium: 4.6 mEq/L (ref 3.5–5.1)
Sodium: 141 mEq/L (ref 135–145)
Total Bilirubin: 0.9 mg/dL (ref 0.2–1.2)
Total Protein: 6.8 g/dL (ref 6.0–8.3)

## 2020-08-21 LAB — LIPID PANEL
Cholesterol: 121 mg/dL (ref 0–200)
HDL: 44.4 mg/dL (ref >39.00)
LDL Cholesterol: 61 mg/dL (ref 0–99)
NonHDL: 76.22
Total CHOL/HDL Ratio: 3
Triglycerides: 75 mg/dL (ref 0.0–149.0)
VLDL: 15 mg/dL (ref 0.0–40.0)

## 2020-08-21 LAB — HEMOGLOBIN A1C: Hgb A1c MFr Bld: 7.4 % — ABNORMAL HIGH (ref 4.6–6.5)

## 2020-08-21 NOTE — Patient Instructions (Addendum)
Find the WAS[employer] crackers .  Very low carb and great tasting.   avoid [place],  [place] an [place] and [place] dressings  Blue cheese,  [place],  [employer] and vinegar are all LOW IN sugar  If your a1c is > 7.0  I will ask you to check sugars 2 hours after a meal and send readings to me after 2 weeks   Your leg swelling and discolaration is due to "venous insufficiency."  you should elevate legs when at home sitting whenever it is possible and wear compression socks daily .   You can  get a good variety of compression socks online through a mail order  Company called [employer],  the website is [email or web address],  put them on in the morning before you have been up and around, and take them off at night before bed.

## 2020-08-21 NOTE — Assessment & Plan Note (Signed)
Noted on lumbar spine films done in June.  Reviewed indications  For continued use of  statin therapy given documented evidence of moderate  atherosclerosis in the aorta noted  and the prognostic implications of this finding.

## 2020-08-21 NOTE — Progress Notes (Addendum)
Subjective:  Patient ID: Richard Hardy, male    DOB: [birth year]  Age: 74 y.o. MRN: [id number]  CC: The primary encounter diagnosis was High priority for COVID-19 virus vaccination. Diagnoses of Venous insufficiency of both lower extremities, Acute right-sided low back pain with right-sided sciatica, Diabetes mellitus without complication ([place]), Essential hypertension, Hyperlipidemia associated with type 2 diabetes mellitus ([place]), and Abdominal aortic atherosclerosis ([place]) were also pertinent to this visit.  HPI Richard Hardy presents for 3 month follow up on diabetes.  This visit occurred during the SARS-CoV-2 public health emergency.  Safety protocols were in place, including screening questions prior to the visit, additional usage of staff PPE, and extensive cleaning of exam room while observing appropriate contact time as indicated for disinfecting solutions.    Patient has received both doses of the available COVID 19 vaccine without complications.  Patient continues to mask when outside of the home except when walking in yard or at safe distances from others .  Patient denies any change in mood or development of unhealthy behaviors resuting from the pandemic's restriction of activities and socialization.    Foot exam done callouses noted  Venous insufficiency  Recent spine films reviewed,  Aortic atherosclerosis noted  .  Discussed with patient. tolerating Lipitor  3 month follow up on Type 2 DM,   Hypertension, hyperlipidemia .  he has been keeping her carbohydrate content well under the recommended  45 carbs per meal  cbgs have been consistently under 130 fasting unless he has crackers at night.  Metformin causing mildly loose stools but no nausea and no fecal incontinencence denies tingling and other neuropathy.   Outpatient Medications Prior to Visit  Medication Sig Dispense Refill  . amLODipine (NORVASC) 5 MG tablet TAKE 1 TABLET BY MOUTH EVERY DAY 90 tablet 3  . aspirin EC 81 MG  tablet Take 1 tablet (81 mg total) by mouth daily. 90 tablet 4  . atorvastatin (LIPITOR) 20 MG tablet Take 1 tablet (20 mg total) by mouth daily. 90 tablet 3  . Blood Glucose Monitoring Suppl (ONETOUCH VERIO IQ SYSTEM) w/Device KIT Use to check blood sugars once daily. 1 kit 0  . CINNAMON PO Take 2 capsules by mouth daily.    . Coenzyme Q10 (COQ10) 200 MG CAPS Take 1 tablet by mouth daily.      . Fish Oil OIL Take by mouth daily.      . fluticasone (FLONASE) 50 MCG/ACT nasal spray Place into the nose at bedtime.      . furosemide (LASIX) 20 MG tablet AS NEEDED FOR SWELLING EVERY 3 DAYS 90 tablet 1  . glucosamine-chondroitin 500-400 MG tablet Take 1 tablet by mouth in the morning and at bedtime.    . hydrochlorothiazide (HYDRODIURIL) 25 MG tablet TAKE 1 TABLET BY MOUTH EVERY DAY 90 tablet 3  . MAGNESIUM CITRATE PO Take 1 capsule by mouth in the morning and at bedtime.    . metFORMIN (GLUCOPHAGE-XR) 500 MG 24 hr tablet TAKE 1 TABLET BY MOUTH EVERY DAY WITH BREAKFAST 90 tablet 0  . Multiple Vitamin (MULTIVITAMIN) capsule Take 1 capsule by mouth daily.    Glory Rosebush DELICA LANCETS 16B MISC Use to check blood sugars once daily. 100 each 1  . ONETOUCH VERIO test strip USE TO CHECK SUGARS ONCE DAILY. 100 strip 1  . Turmeric 500 MG TABS Take 1 tablet by mouth daily.    . vitamin C (ASCORBIC ACID) 500 MG tablet Take 500 mg by mouth daily.  No facility-administered medications prior to visit.    Review of Systems;  Patient denies headache, fevers, malaise, unintentional weight loss, skin rash, eye pain, sinus congestion and sinus pain, sore throat, dysphagia,  hemoptysis , cough, dyspnea, wheezing, chest pain, palpitations, orthopnea, edema, abdominal pain, nausea, melena, diarrhea, constipation, flank pain, dysuria, hematuria, urinary  Frequency, nocturia, numbness, tingling, seizures,  Focal weakness, Loss of consciousness,  Tremor, insomnia, depression, anxiety, and suicidal ideation.       Objective:  BP 114/62 (BP Location: Left Arm, Patient Position: Sitting, Cuff Size: Normal)   Pulse (!) 43   Temp 97.7 F (36.5 C) (Oral)   Resp 15   Ht _0  (1.778 m)   Wt 208 lb 9.6 oz (94.6 kg)   SpO2 98%   BMI 29.93 kg/m   BP Readings from Last 3 Encounters:  08/21/20 114/62  07/18/20 (!) 144/84  05/18/20 132/76    Wt Readings from Last 3 Encounters:  08/21/20 208 lb 9.6 oz (94.6 kg)  07/18/20 210 lb 4 oz (95.4 kg)  07/12/20 211 lb (95.7 kg)    General appearance: alert, cooperative and appears stated age Ears: normal TM's and external ear canals both ears Throat: lips, mucosa, and tongue normal; teeth and gums normal Neck: no adenopathy, no carotid bruit, supple, symmetrical, trachea midline and thyroid not enlarged, symmetric, no tenderness/mass/nodules Back: symmetric, no curvature. ROM normal. No CVA tenderness. Lungs: clear to auscultation bilaterally Heart: regular rate and rhythm, S1, S2 normal, no murmur, click, rub or gallop Abdomen: soft, non-tender; bowel sounds normal; no masses,  no organomegaly Pulses: 2+ and symmetric Skin: Skin color, texture, turgor normal. No rashes or lesions Lymph nodes: Cervical, supraclavicular, and axillary nodes normal.  Lab Results  Component Value Date   HGBA1C 7.0 (H) 05/15/2020   HGBA1C 6.7 (H) 11/10/2019   HGBA1C 6.5 05/11/2019    Lab Results  Component Value Date   CREATININE 0.99 05/15/2020   CREATININE 1.00 11/10/2019   CREATININE 0.98 05/11/2019    Lab Results  Component Value Date   WBC 8.0 07/12/2019   HGB 14.9 07/12/2019   HCT 43.1 07/12/2019   PLT 162 07/12/2019   GLUCOSE 167 (H) 05/15/2020   CHOL 110 05/15/2020   TRIG 65.0 05/15/2020   HDL 37.60 (L) 05/15/2020   LDLDIRECT 60.0 09/29/2017   LDLCALC 59 05/15/2020   ALT 26 05/15/2020   AST 20 05/15/2020   NA 139 05/15/2020   K 4.5 05/15/2020   CL 106 05/15/2020   CREATININE 0.99 05/15/2020   BUN 18 05/15/2020   CO2 31 05/15/2020    TSH 2.320 07/09/2019   PSA 0.32 11/10/2019   HGBA1C 7.0 (H) 05/15/2020   MICROALBUR 1.1 05/15/2020    No results found.  Assessment & Plan:   Problem List Items Addressed This Visit      Unprioritized   Diabetes mellitus without complication ([place])    Currently well-controlled on current dose of  metformin.    hemoglobin A1c is at goal ..  Patient is reminded to schedule an annual eye exam and foot exam is normal today. Patient has no microalbuminuria. Patient is tolerating statin therapy for CAD risk reduction .   ACE/ARB is contraindicated due to hyperkalemia.   Lab Results  Component Value Date   HGBA1C 7.0 (H) 05/15/2020   Lab Results  Component Value Date   MICROALBUR 1.1 05/15/2020   MICROALBUR 0.8 05/11/2019           Relevant Orders  Hemoglobin A1c   Comprehensive metabolic panel   Hypertension    Well controlled on current regimen. Renal function stable, no changes today.      Hyperlipidemia associated with type 2 diabetes mellitus ([facility])    LDL and triglycerides remain at goal on current medications without side effects or  liver enzymes elevation.  Continue  low dose lipitor   Lab Results  Component Value Date   CHOL 110 05/15/2020   HDL 37.60 (L) 05/15/2020   LDLCALC 59 05/15/2020   LDLDIRECT 60.0 09/29/2017   TRIG 65.0 05/15/2020   CHOLHDL 3 05/15/2020   Lab Results  Component Value Date   ALT 26 05/15/2020   AST 20 05/15/2020   ALKPHOS 59 05/15/2020   BILITOT 0.7 05/15/2020          Relevant Orders   Lipid panel   Venous insufficiency of both lower extremities    With stasis  changes noted. Compression socks recommended.       Acute right-sided low back pain with right-sided sciatica    Aggravated by COVID vaccination.  Finally Improved after several months and with chiropractic manipulation       High priority for COVID-19 virus vaccination - Primary    He is apprehensive about receiving a booster vaccination due to several months  of joint pain following the initial series.  Requesting covid antibodies titer      Relevant Orders   SARS-CoV-2 Semi-Quantitative Total Antibody, Spike   Abdominal aortic atherosclerosis ([place])    Noted on lumbar spine films done in June.  Reviewed indications  For continued use of  statin therapy given documented evidence of moderate  atherosclerosis in the aorta noted  and the prognostic implications of this finding.          I am having Elliot Dally. Kitt maintain his CoQ10, Fish Oil, fluticasone, vitamin C, aspirin EC, CINNAMON PO, Turmeric, OneTouch Verio IQ System, OneTouch Delica Lancets 74Q, hydrochlorothiazide, atorvastatin, amLODipine, OneTouch Verio, multivitamin, MAGNESIUM CITRATE PO, glucosamine-chondroitin, metFORMIN, and furosemide.  No orders of the defined types were placed in this encounter.   There are no discontinued medications.  Follow-up: Return in about 3 months (around 11/20/2020) for follow up diabetes.   Crecencio Mc, MD

## 2020-08-21 NOTE — Assessment & Plan Note (Signed)
He is apprehensive about receiving a booster vaccination due to several months of joint pain following the initial series.  Requesting covid antibodies titer

## 2020-08-21 NOTE — Assessment & Plan Note (Signed)
Well controlled on current regimen. Renal function stable, no changes today. 

## 2020-08-21 NOTE — Assessment & Plan Note (Signed)
LDL and triglycerides remain at goal on current medications without side effects or  liver enzymes elevation.  Continue  low dose lipitor   Lab Results  Component Value Date   CHOL 110 05/15/2020   HDL 37.60 (L) 05/15/2020   LDLCALC 59 05/15/2020   LDLDIRECT 60.0 09/29/2017   TRIG 65.0 05/15/2020   CHOLHDL 3 05/15/2020   Lab Results  Component Value Date   ALT 26 05/15/2020   AST 20 05/15/2020   ALKPHOS 59 05/15/2020   BILITOT 0.7 05/15/2020

## 2020-08-21 NOTE — Assessment & Plan Note (Signed)
Currently well-controlled on current dose of  metformin.    hemoglobin A1c is at goal ..  Patient is reminded to schedule an annual eye exam and foot exam is normal today. Patient has no microalbuminuria. Patient is tolerating statin therapy for CAD risk reduction .   ACE/ARB is contraindicated due to hyperkalemia.   Lab Results  Component Value Date   HGBA1C 7.0 (H) 05/15/2020   Lab Results  Component Value Date   MICROALBUR 1.1 05/15/2020   MICROALBUR 0.8 05/11/2019

## 2020-08-21 NOTE — Assessment & Plan Note (Addendum)
Aggravated by COVID vaccination.  Finally Improved after several months and with chiropractic manipulation

## 2020-08-21 NOTE — Assessment & Plan Note (Signed)
With stasis  changes noted. Compression socks recommended.

## 2020-08-23 DIAGNOSIS — Z8379 Family history of other diseases of the digestive system: Secondary | ICD-10-CM | POA: Diagnosis not present

## 2020-08-23 DIAGNOSIS — Z01812 Encounter for preprocedural laboratory examination: Secondary | ICD-10-CM | POA: Diagnosis not present

## 2020-08-23 DIAGNOSIS — Z9989 Dependence on other enabling machines and devices: Secondary | ICD-10-CM | POA: Diagnosis not present

## 2020-08-23 DIAGNOSIS — E119 Type 2 diabetes mellitus without complications: Secondary | ICD-10-CM | POA: Diagnosis not present

## 2020-08-23 DIAGNOSIS — Z800 Family history of malignant neoplasm of digestive organs: Secondary | ICD-10-CM | POA: Diagnosis not present

## 2020-08-23 DIAGNOSIS — G4733 Obstructive sleep apnea (adult) (pediatric): Secondary | ICD-10-CM | POA: Diagnosis not present

## 2020-08-23 DIAGNOSIS — Z86010 Personal history of colonic polyps: Secondary | ICD-10-CM | POA: Diagnosis not present

## 2020-08-25 LAB — SARS-COV-2 SEMI-QUANTITATIVE TOTAL ANTIBODY, SPIKE: SARS COV2 AB, Total Spike Semi QN: 391.4 U/mL — ABNORMAL HIGH (ref <0.8)

## 2020-08-27 ENCOUNTER — Telehealth: Payer: Self-pay | Admitting: Internal Medicine

## 2020-08-29 DIAGNOSIS — M9902 Segmental and somatic dysfunction of thoracic region: Secondary | ICD-10-CM | POA: Diagnosis not present

## 2020-08-29 DIAGNOSIS — M546 Pain in thoracic spine: Secondary | ICD-10-CM | POA: Diagnosis not present

## 2020-08-29 DIAGNOSIS — M9903 Segmental and somatic dysfunction of lumbar region: Secondary | ICD-10-CM | POA: Diagnosis not present

## 2020-08-29 DIAGNOSIS — M9905 Segmental and somatic dysfunction of pelvic region: Secondary | ICD-10-CM | POA: Diagnosis not present

## 2020-08-29 DIAGNOSIS — M955 Acquired deformity of pelvis: Secondary | ICD-10-CM | POA: Diagnosis not present

## 2020-08-29 DIAGNOSIS — M5442 Lumbago with sciatica, left side: Secondary | ICD-10-CM | POA: Diagnosis not present

## 2020-09-03 DIAGNOSIS — G4733 Obstructive sleep apnea (adult) (pediatric): Secondary | ICD-10-CM | POA: Diagnosis not present

## 2020-09-13 ENCOUNTER — Other Ambulatory Visit: Payer: Self-pay | Admitting: Internal Medicine

## 2020-09-13 DIAGNOSIS — I10 Essential (primary) hypertension: Secondary | ICD-10-CM

## 2020-09-17 ENCOUNTER — Other Ambulatory Visit: Payer: Self-pay | Admitting: Internal Medicine

## 2020-10-03 DIAGNOSIS — G4733 Obstructive sleep apnea (adult) (pediatric): Secondary | ICD-10-CM | POA: Diagnosis not present

## 2020-10-11 DIAGNOSIS — M9905 Segmental and somatic dysfunction of pelvic region: Secondary | ICD-10-CM | POA: Diagnosis not present

## 2020-10-11 DIAGNOSIS — M9903 Segmental and somatic dysfunction of lumbar region: Secondary | ICD-10-CM | POA: Diagnosis not present

## 2020-10-11 DIAGNOSIS — M9902 Segmental and somatic dysfunction of thoracic region: Secondary | ICD-10-CM | POA: Diagnosis not present

## 2020-10-11 DIAGNOSIS — M546 Pain in thoracic spine: Secondary | ICD-10-CM | POA: Diagnosis not present

## 2020-10-11 DIAGNOSIS — M955 Acquired deformity of pelvis: Secondary | ICD-10-CM | POA: Diagnosis not present

## 2020-10-11 DIAGNOSIS — M5442 Lumbago with sciatica, left side: Secondary | ICD-10-CM | POA: Diagnosis not present

## 2020-10-12 ENCOUNTER — Other Ambulatory Visit
Admission: RE | Admit: 2020-10-12 | Discharge: 2020-10-12 | Disposition: A | Discharge status: Home / Self Care | Payer: Medicare HMO | Source: Ambulatory Visit | Attending: Internal Medicine | Admitting: Internal Medicine

## 2020-10-12 ENCOUNTER — Other Ambulatory Visit: Payer: Self-pay

## 2020-10-12 DIAGNOSIS — G4733 Obstructive sleep apnea (adult) (pediatric): Secondary | ICD-10-CM | POA: Diagnosis not present

## 2020-10-12 DIAGNOSIS — Z20822 Contact with and (suspected) exposure to covid-19: Secondary | ICD-10-CM | POA: Insufficient documentation

## 2020-10-12 DIAGNOSIS — Z01812 Encounter for preprocedural laboratory examination: Secondary | ICD-10-CM | POA: Insufficient documentation

## 2020-10-12 LAB — SARS CORONAVIRUS 2 (TAT 6-24 HRS): SARS Coronavirus 2: NEGATIVE

## 2020-10-16 ENCOUNTER — Other Ambulatory Visit: Payer: Self-pay

## 2020-10-16 ENCOUNTER — Encounter: Payer: Self-pay | Admitting: Internal Medicine

## 2020-10-16 ENCOUNTER — Ambulatory Visit: Payer: Medicare HMO | Admitting: Anesthesiology

## 2020-10-16 ENCOUNTER — Ambulatory Visit
Admission: RE | Admit: 2020-10-16 | Discharge: 2020-10-16 | Disposition: A | Discharge status: Home / Self Care | Payer: Medicare HMO | Attending: Internal Medicine | Admitting: Internal Medicine

## 2020-10-16 ENCOUNTER — Encounter
Admission: RE | Disposition: A | Discharge status: Home / Self Care | Payer: Self-pay | Source: Home / Self Care | Attending: Internal Medicine

## 2020-10-16 DIAGNOSIS — Z882 Allergy status to sulfonamides status: Secondary | ICD-10-CM | POA: Insufficient documentation

## 2020-10-16 DIAGNOSIS — Z881 Allergy status to other antibiotic agents status: Secondary | ICD-10-CM | POA: Diagnosis not present

## 2020-10-16 DIAGNOSIS — E119 Type 2 diabetes mellitus without complications: Secondary | ICD-10-CM | POA: Diagnosis not present

## 2020-10-16 DIAGNOSIS — Z86010 Personal history of colonic polyps: Secondary | ICD-10-CM | POA: Diagnosis not present

## 2020-10-16 DIAGNOSIS — Z880 Allergy status to penicillin: Secondary | ICD-10-CM | POA: Insufficient documentation

## 2020-10-16 DIAGNOSIS — Z79899 Other long term (current) drug therapy: Secondary | ICD-10-CM | POA: Insufficient documentation

## 2020-10-16 DIAGNOSIS — Z888 Allergy status to other drugs, medicaments and biological substances status: Secondary | ICD-10-CM | POA: Diagnosis not present

## 2020-10-16 DIAGNOSIS — Z1211 Encounter for screening for malignant neoplasm of colon: Secondary | ICD-10-CM | POA: Insufficient documentation

## 2020-10-16 DIAGNOSIS — I10 Essential (primary) hypertension: Secondary | ICD-10-CM | POA: Diagnosis not present

## 2020-10-16 DIAGNOSIS — Z7984 Long term (current) use of oral hypoglycemic drugs: Secondary | ICD-10-CM | POA: Insufficient documentation

## 2020-10-16 DIAGNOSIS — Q438 Other specified congenital malformations of intestine: Secondary | ICD-10-CM | POA: Diagnosis not present

## 2020-10-16 DIAGNOSIS — K6389 Other specified diseases of intestine: Secondary | ICD-10-CM | POA: Diagnosis not present

## 2020-10-16 DIAGNOSIS — G4730 Sleep apnea, unspecified: Secondary | ICD-10-CM | POA: Diagnosis not present

## 2020-10-16 HISTORY — DX: Cardiac arrhythmia, unspecified: I49.9

## 2020-10-16 HISTORY — DX: Unspecified osteoarthritis, unspecified site: M19.90

## 2020-10-16 HISTORY — PX: COLONOSCOPY WITH PROPOFOL: SHX5780

## 2020-10-16 LAB — GLUCOSE, CAPILLARY: Glucose-Capillary: 117 mg/dL — ABNORMAL HIGH (ref 70–99)

## 2020-10-16 SURGERY — COLONOSCOPY WITH PROPOFOL
Anesthesia: General

## 2020-10-16 MED ORDER — PROPOFOL 500 MG/50ML IV EMUL
INTRAVENOUS | Status: DC | PRN
  Administered 2020-10-16: 120 ug/kg/min via INTRAVENOUS

## 2020-10-16 MED ORDER — PROPOFOL 500 MG/50ML IV EMUL
INTRAVENOUS | Status: AC
  Filled 2020-10-16: qty 50

## 2020-10-16 MED ORDER — SODIUM CHLORIDE 0.9 % IV SOLN
INTRAVENOUS | Status: DC
  Administered 2020-10-16: 1000 mL via INTRAVENOUS

## 2020-10-16 NOTE — Anesthesia Postprocedure Evaluation (Signed)
Anesthesia Post Note  Patient: Richard Hardy  Procedure(s) Performed: COLONOSCOPY WITH PROPOFOL (N/A )  Patient location during evaluation: Endoscopy Anesthesia Type: General Level of consciousness: awake Pain management: satisfactory to patient Vital Signs Assessment: post-procedure vital signs reviewed and stable Respiratory status: nonlabored ventilation and spontaneous breathing Cardiovascular status: stable Postop Assessment: no apparent nausea or vomiting Anesthetic complications: no   No complications documented.   Last Vitals:  Vitals:   10/16/20 1312 10/16/20 1436  BP: 127/82   Resp: 20   Temp: (!) 36.2 C 36.8 C  SpO2: 94%     Last Pain:  Vitals:   10/16/20 1436  TempSrc: Temporal  PainSc: Asleep                 Neva Seat

## 2020-10-16 NOTE — Interval H&P Note (Signed)
History and Physical Interval Note:  10/16/2020 1:27 PM  Richard Hardy  has presented today for surgery, with the diagnosis of PH adenomatous polyps.  The various methods of treatment have been discussed with the patient and family. After consideration of risks, benefits and other options for treatment, the patient has consented to  Procedure(s): COLONOSCOPY WITH PROPOFOL (N/A) as a surgical intervention.  The patient's history has been reviewed, patient examined, no change in status, stable for surgery.  I have reviewed the patient's chart and labs.  Questions were answered to the patient's satisfaction.     [place], [place]

## 2020-10-16 NOTE — H&P (Signed)
Outpatient short stay form Pre-procedure 10/16/2020 1:26 PM Kalief Kattner K. Alice Reichert, M.D.  Primary Physician: Deborra Medina, M.D.  Reason for visit:  Personal history of adenomatous colon polyps  History of present illness:                           Patient presents for colonoscopy for a personal hx of colon polyps. The patient denies abdominal pain, abnormal weight loss or rectal bleeding.      Current Facility-Administered Medications:  .  0.9 %  sodium chloride infusion, , Intravenous, Continuous, [name], Benay Pike, MD  Medications Prior to Admission  Medication Sig Dispense Refill Last Dose  . amLODipine (NORVASC) 5 MG tablet TAKE 1 TABLET BY MOUTH EVERY DAY 90 tablet 3 Past Week at Unknown time  . atorvastatin (LIPITOR) 20 MG tablet Take 1 tablet (20 mg total) by mouth daily. 90 tablet 3 Past Week at Unknown time  . Blood Glucose Monitoring Suppl (ONETOUCH VERIO IQ SYSTEM) w/Device KIT Use to check blood sugars once daily. 1 kit 0 10/16/2020 at Unknown time  . CINNAMON PO Take 2 capsules by mouth daily.   Past Week at Unknown time  . Coenzyme Q10 (COQ10) 200 MG CAPS Take 1 tablet by mouth daily.     Past Week at Unknown time  . Fish Oil OIL Take by mouth daily.     Past Week at Unknown time  . fluticasone (FLONASE) 50 MCG/ACT nasal spray Place into the nose at bedtime.     10/15/2020 at Unknown time  . furosemide (LASIX) 20 MG tablet AS NEEDED FOR SWELLING EVERY 3 DAYS 90 tablet 1 Past Week at Unknown time  . glucosamine-chondroitin 500-400 MG tablet Take 1 tablet by mouth in the morning and at bedtime.   Past Week at Unknown time  . hydrochlorothiazide (HYDRODIURIL) 25 MG tablet TAKE 1 TABLET BY MOUTH EVERY DAY 90 tablet 3 10/16/2020 at 0800  . MAGNESIUM CITRATE PO Take 1 capsule by mouth in the morning and at bedtime.   Past Week at Unknown time  . metFORMIN (GLUCOPHAGE-XR) 500 MG 24 hr tablet TAKE 1 TABLET BY MOUTH EVERY DAY WITH BREAKFAST 90 tablet 0 10/15/2020 at Unknown time  . Multiple  Vitamin (MULTIVITAMIN) capsule Take 1 capsule by mouth daily.   Past Week at Unknown time  . ONETOUCH VERIO test strip USE TO CHECK SUGARS ONCE DAILY. 100 strip 1 Past Week at Unknown time  . Turmeric 500 MG TABS Take 1 tablet by mouth daily.   Past Week at Unknown time  . vitamin C (ASCORBIC ACID) 500 MG tablet Take 500 mg by mouth daily.   Past Week at Unknown time  . aspirin EC 81 MG tablet Take 1 tablet (81 mg total) by mouth daily. (Patient not taking: Reported on 10/16/2020) 90 tablet 4 Not Taking at Unknown time  . ONETOUCH DELICA LANCETS 09N MISC Use to check blood sugars once daily. 100 each 1      Allergies  Allergen Reactions  . Losartan     Hyperkalemia   . Lisinopril     cough  . Metformin And Related     Stomach aches  . Sulfonamide Derivatives     REACTION: hives  . Augmentin [Amoxicillin-Pot Clavulanate] Rash     Past Medical History:  Diagnosis Date  . Arthritis   . Diabetes mellitus without complication ([place]) 2355  . Dysrhythmia   . Glucose intolerance (impaired glucose tolerance)   .  Hyperlipidemia   . Obesity   . Sleep apnea    On BiPAP, Dr. Richardson Landry  . Thrombocythemia    Dr. Grayland Ormond    Review of systems:  Otherwise negative.    Physical Exam  Gen: Alert, oriented. Appears stated age.  HEENT: [place]/AT. PERRLA. Lungs: CTA, no wheezes. CV: RR nl S1, S2. Abd: soft, benign, no masses. BS+ Ext: No edema. Pulses 2+    Planned procedures: Proceed with colonoscopy. The patient understands the nature of the planned procedure, indications, risks, alternatives and potential complications including but not limited to bleeding, infection, perforation, damage to internal organs and possible oversedation/side effects from anesthesia. The patient agrees and gives consent to proceed.  Please refer to procedure notes for findings, recommendations and patient disposition/instructions.     Jesscia Imm K. Alice Reichert, M.D. Gastroenterology 10/16/2020  1:26 PM

## 2020-10-16 NOTE — Anesthesia Procedure Notes (Signed)
Performed by: Cook-Martin, Kelci Petrella °Pre-anesthesia Checklist: Patient identified, Emergency Drugs available, Suction available, Patient being monitored and Timeout performed °Patient Re-evaluated:Patient Re-evaluated prior to induction °Oxygen Delivery Method: Simple face mask °Preoxygenation: Pre-oxygenation with 100% oxygen °Induction Type: IV induction °Placement Confirmation: positive ETCO2 and CO2 detector ° ° ° ° ° ° °

## 2020-10-16 NOTE — Transfer of Care (Signed)
Immediate Anesthesia Transfer of Care Note  Patient: Richard Hardy  Procedure(s) Performed: COLONOSCOPY WITH PROPOFOL (N/A )  Patient Location: PACU  Anesthesia Type:General  Level of Consciousness: awake and sedated  Airway & Oxygen Therapy: Patient Spontanous Breathing and Patient connected to nasal cannula oxygen  Post-op Assessment: Report given to RN and Post -op Vital signs reviewed and stable  Post vital signs: Reviewed and stable  Last Vitals:  Vitals Value Taken Time  BP    Temp    Pulse    Resp    SpO2      Last Pain:  Vitals:   10/16/20 1312  TempSrc: Temporal  PainSc: 0-No pain      Patients Stated Pain Goal: 0 (12/92/90 9030)  Complications: No complications documented.

## 2020-10-16 NOTE — Op Note (Signed)
[facility] Gastroenterology Patient Name: Richard Hardy Procedure Date: 10/16/2020 2:01 PM MRN: [id number] Account #: [id number] Date of Birth: [birth year] Admit Type: Outpatient Age: 74 Room: [facility] ENDO ROOM 2 Gender: Male Note Status: Finalized Procedure:             Colonoscopy Indications:           High risk colon cancer surveillance: Personal history                         of colonic polyps Providers:             Benay Pike. Aileene Lanum MD, MD Medicines:             Propofol per Anesthesia Complications:         No immediate complications. Procedure:             Pre-Anesthesia Assessment:                        - The risks and benefits of the procedure and the                         sedation options and risks were discussed with the                         patient. All questions were answered and informed                         consent was obtained.                        - Patient identification and proposed procedure were                         verified prior to the procedure by the nurse. The                         procedure was verified in the procedure room.                        - ASA Grade Assessment: II - A patient with mild                         systemic disease.                        - After reviewing the risks and benefits, the patient                         was deemed in satisfactory condition to undergo the                         procedure.                        After obtaining informed consent, the colonoscope was                         passed under direct vision. Throughout the procedure,  the patient's blood pressure, pulse, and oxygen                         saturations were monitored continuously. The                         Colonoscope was introduced through the anus and                         advanced to the the cecum, identified by appendiceal                         orifice and ileocecal valve. The patient  tolerated the                         procedure well. The quality of the bowel preparation                         was good. The ileocecal valve, appendiceal orifice,                         and rectum were photographed. Findings:      The perianal and digital rectal examinations were normal. Pertinent       negatives include normal sphincter tone and no palpable rectal lesions.      Normal mucosa was found in the entire colon.      There is no endoscopic evidence of inflammation, mass, polyps or       angiodysplasia in the entire colon.      The colon (entire examined portion) was moderately redundant. Advancing       the scope required changing the patient to a prone position.      The exam was otherwise without abnormality on direct and retroflexion       views. Impression:            - Normal mucosa in the entire examined colon.                        - Redundant colon.                        - The examination was otherwise normal on direct and                         retroflexion views.                        - No specimens collected. Recommendation:        - Patient has a contact number available for                         emergencies. The signs and symptoms of potential                         delayed complications were discussed with the patient.                         Return to normal activities tomorrow. Written  discharge instructions were provided to the patient.                        - Resume previous diet.                        - Continue present medications.                        - No repeat colonoscopy due to current age (55 years                         or older) and the absence of colonic polyps.                        - You do NOT require further colon cancer screening                         measures (Annual stool testing (i.e. hemoccult, FIT,                         cologuard), sigmoidoscopy, colonoscopy or CT                          colonography). You should share this recommendation                         with your Primary Care provider.                        - Return to GI office (date not yet determined). Procedure Code(s):     --- Professional ---                        P1025, Colorectal cancer screening; colonoscopy on                         individual at high risk Diagnosis Code(s):     --- Professional ---                        Q43.8, Other specified congenital malformations of                         intestine                        Z86.010, Personal history of colonic polyps CPT copyright 2019 American Medical Association. All rights reserved. The codes documented in this report are preliminary and upon coder review may  be revised to meet current compliance requirements. Efrain Sella MD, MD 10/16/2020 2:35:21 PM This report has been signed electronically. Number of Addenda: 0 Note Initiated On: 10/16/2020 2:01 PM Scope Withdrawal Time: 0 hours 7 minutes 32 seconds  Total Procedure Duration: 0 hours 21 minutes 0 seconds  Estimated Blood Loss:  Estimated blood loss: none.      [facility]

## 2020-10-16 NOTE — Anesthesia Preprocedure Evaluation (Signed)
Anesthesia Evaluation  Patient identified by MRN, date of birth, ID band  Airway Mallampati: II       Dental no notable dental hx.    Pulmonary sleep apnea , Patient abstained from smoking., former smoker,    Pulmonary exam normal breath sounds clear to auscultation       Cardiovascular hypertension, Normal cardiovascular exam+ dysrhythmias  Rhythm:Regular Rate:Normal     Neuro/Psych  Neuromuscular disease negative psych ROS   GI/Hepatic   Endo/Other  diabetes  Renal/GU      Musculoskeletal  (+) Arthritis ,   Abdominal Normal abdominal exam  (+)   Peds  Hematology   Anesthesia Other Findings   Reproductive/Obstetrics                             Anesthesia Physical Anesthesia Plan  ASA: III  Anesthesia Plan: General   Post-op Pain Management:    Induction: Intravenous  PONV Risk Score and Plan: Propofol infusion  Airway Management Planned: Nasal Cannula  Additional Equipment:   Intra-op Plan:   Post-operative Plan:   Informed Consent: I have reviewed the patients History and Physical, chart, labs and discussed the procedure including the risks, benefits and alternatives for the proposed anesthesia with the patient or authorized representative who has indicated his/her understanding and acceptance.       Plan Discussed with:   Anesthesia Plan Comments:         Anesthesia Quick Evaluation

## 2020-10-18 ENCOUNTER — Encounter: Payer: Self-pay | Admitting: Internal Medicine

## 2020-11-03 DIAGNOSIS — G4733 Obstructive sleep apnea (adult) (pediatric): Secondary | ICD-10-CM | POA: Diagnosis not present

## 2020-11-11 DIAGNOSIS — G4733 Obstructive sleep apnea (adult) (pediatric): Secondary | ICD-10-CM | POA: Diagnosis not present

## 2020-11-13 MED ORDER — METFORMIN HCL ER 500 MG PO TB24
ORAL_TABLET | ORAL | 1 refills | Status: DC
Start: 2020-11-13 — End: 2021-06-28

## 2020-11-15 DIAGNOSIS — M9902 Segmental and somatic dysfunction of thoracic region: Secondary | ICD-10-CM | POA: Diagnosis not present

## 2020-11-15 DIAGNOSIS — M9903 Segmental and somatic dysfunction of lumbar region: Secondary | ICD-10-CM | POA: Diagnosis not present

## 2020-11-15 DIAGNOSIS — M5442 Lumbago with sciatica, left side: Secondary | ICD-10-CM | POA: Diagnosis not present

## 2020-11-15 DIAGNOSIS — M955 Acquired deformity of pelvis: Secondary | ICD-10-CM | POA: Diagnosis not present

## 2020-11-15 DIAGNOSIS — M546 Pain in thoracic spine: Secondary | ICD-10-CM | POA: Diagnosis not present

## 2020-11-15 DIAGNOSIS — M9905 Segmental and somatic dysfunction of pelvic region: Secondary | ICD-10-CM | POA: Diagnosis not present

## 2020-11-20 ENCOUNTER — Other Ambulatory Visit: Payer: Self-pay

## 2020-11-20 ENCOUNTER — Ambulatory Visit (INDEPENDENT_AMBULATORY_CARE_PROVIDER_SITE_OTHER): Payer: Medicare HMO | Admitting: Internal Medicine

## 2020-11-20 ENCOUNTER — Encounter: Payer: Self-pay | Admitting: Internal Medicine

## 2020-11-20 VITALS — BP 128/78 | HR 56 | Temp 98.2°F | Resp 15 | Ht 71.0 in | Wt 205.0 lb

## 2020-11-20 DIAGNOSIS — E785 Hyperlipidemia, unspecified: Secondary | ICD-10-CM | POA: Diagnosis not present

## 2020-11-20 DIAGNOSIS — I10 Essential (primary) hypertension: Secondary | ICD-10-CM | POA: Diagnosis not present

## 2020-11-20 DIAGNOSIS — E1169 Type 2 diabetes mellitus with other specified complication: Secondary | ICD-10-CM | POA: Diagnosis not present

## 2020-11-20 DIAGNOSIS — Z23 Encounter for immunization: Secondary | ICD-10-CM | POA: Diagnosis not present

## 2020-11-20 DIAGNOSIS — E119 Type 2 diabetes mellitus without complications: Secondary | ICD-10-CM

## 2020-11-20 DIAGNOSIS — E663 Overweight: Secondary | ICD-10-CM

## 2020-11-20 DIAGNOSIS — I700 Atherosclerosis of aorta: Secondary | ICD-10-CM | POA: Diagnosis not present

## 2020-11-20 LAB — COMPREHENSIVE METABOLIC PANEL
ALT: 34 U/L (ref 0–53)
AST: 26 U/L (ref 0–37)
Albumin: 4.4 g/dL (ref 3.5–5.2)
Alkaline Phosphatase: 60 U/L (ref 39–117)
BUN: 15 mg/dL (ref 6–23)
CO2: 31 mEq/L (ref 19–32)
Calcium: 9.5 mg/dL (ref 8.4–10.5)
Chloride: 103 mEq/L (ref 96–112)
Creatinine, Ser: 1.05 mg/dL (ref 0.40–1.50)
GFR: 68.8 mL/min (ref >60.00)
Glucose, Bld: 176 mg/dL — ABNORMAL HIGH (ref 70–99)
Potassium: 4.2 mEq/L (ref 3.5–5.1)
Sodium: 140 mEq/L (ref 135–145)
Total Bilirubin: 1 mg/dL (ref 0.2–1.2)
Total Protein: 6.8 g/dL (ref 6.0–8.3)

## 2020-11-20 LAB — LIPID PANEL
Cholesterol: 107 mg/dL (ref 0–200)
HDL: 43.2 mg/dL (ref >39.00)
LDL Cholesterol: 49 mg/dL (ref 0–99)
NonHDL: 63.97
Total CHOL/HDL Ratio: 2
Triglycerides: 74 mg/dL (ref 0.0–149.0)
VLDL: 14.8 mg/dL (ref 0.0–40.0)

## 2020-11-20 LAB — HEMOGLOBIN A1C: Hgb A1c MFr Bld: 7.8 % — ABNORMAL HIGH (ref 4.6–6.5)

## 2020-11-20 MED ORDER — ZOSTER VAC RECOMB ADJUVANTED 50 MCG/0.5ML IM SUSR: 0.5000 mL | Freq: Once | INTRAMUSCULAR | 1 refills | Status: AC

## 2020-11-20 NOTE — Assessment & Plan Note (Addendum)
He is tolerating the XR Metformin, but his fasting sugars have been > 140 for the past several weeks..  Encouraged to check dinner post prandials for one week.  May need to add 2.5 mg glipizide or Januvia depending on a1c today.  Foot and eye exams are up to date.  Vaccination status complete w/r/t pneumonia vaccines .  He is tlerating stati without myalgias and taking an asa daily as well because of aortic atherosclerosis

## 2020-11-20 NOTE — Patient Instructions (Addendum)
You are doing great !  Don't worry about the occasional pancake breakfast!  Maintaining a health activity level as you are doing will bring down the daytime sugars  Please check your blood sugars every day 2 hours after your  evening meal  For one week ,  And send me the readings  Via mychart   Hoping you have a wonderful and blessed Christmas season, and a Happy new Year!    Dr. Derrel Nip

## 2020-11-20 NOTE — Assessment & Plan Note (Signed)
I have congratulated him in reduction of   BMI and encouraged  Continued weight loss with goal of 10% of body weigh over the next 6 months using a low glycemic index diet and regular exercise a minimum of 5 days per week.   

## 2020-11-20 NOTE — Assessment & Plan Note (Signed)
He remains asymptomatic and active.  Continue statin and asa

## 2020-11-20 NOTE — Assessment & Plan Note (Signed)
Well controlled on current regimen of hctz and amlodipine.  He has a C/I to ARB (hyperkalemia). Renal function stable, no changes today.  Lab Results  Component Value Date   CREATININE 1.10 08/21/2020   Lab Results  Component Value Date   NA 141 08/21/2020   K 4.6 08/21/2020   CL 103 08/21/2020   CO2 31 08/21/2020

## 2020-11-20 NOTE — Progress Notes (Signed)
Subjective:  Patient ID: Richard Hardy, male    DOB: 1946-06-25  Age: 74 y.o. MRN: [id number]  CC: The primary encounter diagnosis was Need for immunization against influenza. Diagnoses of Hyperlipidemia associated with type 2 diabetes mellitus ([place]), Diabetes mellitus without complication ([place]), Overweight (BMI 25.0-29.9), Primary hypertension, Hyperlipidemia due to type 2 diabetes mellitus ([place]), and Abdominal aortic atherosclerosis ([place]) were also pertinent to this visit.  HPI Richard Hardy presents for follow up on type 2 DM  This visit occurred during the SARS-CoV-2 public health emergency.  Safety protocols were in place, including screening questions prior to the visit, additional usage of staff PPE, and extensive cleaning of exam room while observing appropriate contact time as indicated for disinfecting solutions.    He feels generally well, is exercising daily  and checking blood sugars once daily at variable times.  BS have ranged from  140 to 160 fasting  and < 130 post prandially.  Denies any recent hypoglyemic events.  Taking his medications as directed. Following a carbohydrate modified diet 6 days per week. Denies numbness, burning and tingling of extremities. Appetite is good.  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  Had no fevers,  But sinus discharge was bloody and he had body aches and felt lousy for 4 days. Marland Kitchen  declines the booster COVID vaccine because of intense body aches that occurred after the 2nd dose.         Outpatient Medications Prior to Visit  Medication Sig Dispense Refill  . amLODipine (NORVASC) 5 MG tablet TAKE 1 TABLET BY MOUTH EVERY DAY 90 tablet 3  . aspirin EC 81 MG tablet Take 1 tablet (81 mg total) by mouth daily. 90 tablet 4  . atorvastatin (LIPITOR) 20 MG tablet Take 1 tablet (20 mg total) by mouth daily. 90 tablet 3  . Blood Glucose Monitoring Suppl (ONETOUCH VERIO IQ SYSTEM) w/Device KIT  Use to check blood sugars once daily. 1 kit 0  . CINNAMON PO Take 2 capsules by mouth daily.    . Coenzyme Q10 (COQ10) 200 MG CAPS Take 1 tablet by mouth daily.    . Fish Oil OIL Take by mouth daily.    . fluticasone (FLONASE) 50 MCG/ACT nasal spray Place into the nose at bedtime.    . furosemide (LASIX) 20 MG tablet AS NEEDED FOR SWELLING EVERY 3 DAYS 90 tablet 1  . glucosamine-chondroitin 500-400 MG tablet Take 1 tablet by mouth in the morning and at bedtime.    . hydrochlorothiazide (HYDRODIURIL) 25 MG tablet TAKE 1 TABLET BY MOUTH EVERY DAY 90 tablet 3  . MAGNESIUM CITRATE PO Take 1 capsule by mouth in the morning and at bedtime.    . metFORMIN (GLUCOPHAGE-XR) 500 MG 24 hr tablet TAKE 1 TABLET BY MOUTH EVERY DAY WITH BREAKFAST 90 tablet 1  . Multiple Vitamin (MULTIVITAMIN) capsule Take 1 capsule by mouth daily.    Glory Rosebush DELICA LANCETS 29N MISC Use to check blood sugars once daily. 100 each 1  . ONETOUCH VERIO test strip USE TO CHECK SUGARS ONCE DAILY. 100 strip 1  . Turmeric 500 MG TABS Take 1 tablet by mouth daily.    . vitamin C (ASCORBIC ACID) 500 MG tablet Take 500 mg by mouth daily.     No facility-administered medications prior to visit.    Review of Systems;  Patient denies headache, fevers, malaise, unintentional weight loss, skin  rash, eye pain, sinus congestion and sinus pain, sore throat, dysphagia,  hemoptysis , cough, dyspnea, wheezing, chest pain, palpitations, orthopnea, edema, abdominal pain, nausea, melena, diarrhea, constipation, flank pain, dysuria, hematuria, urinary  Frequency, nocturia, numbness, tingling, seizures,  Focal weakness, Loss of consciousness,  Tremor, insomnia, depression, anxiety, and suicidal ideation.      Objective:  BP 128/78 (BP Location: Left Arm, Patient Position: Sitting, Cuff Size: Normal)   Pulse (!) 56   Temp 98.2 F (36.8 C) (Oral)   Resp 15   Ht 5' 11" (1.803 m)   Wt 205 lb (93 kg)   SpO2 98%   BMI 28.59 kg/m   BP Readings  from Last 3 Encounters:  11/20/20 128/78  10/16/20 (!) 99/56  08/21/20 114/62    Wt Readings from Last 3 Encounters:  11/20/20 205 lb (93 kg)  10/16/20 210 lb (95.3 kg)  08/21/20 208 lb 9.6 oz (94.6 kg)    General appearance: alert, cooperative and appears stated age Ears: normal TM's and external ear canals both ears Throat: lips, mucosa, and tongue normal; teeth and gums normal Neck: no adenopathy, no carotid bruit, supple, symmetrical, trachea midline and thyroid not enlarged, symmetric, no tenderness/mass/nodules Back: symmetric, no curvature. ROM normal. No CVA tenderness. Lungs: clear to auscultation bilaterally Heart: regular rate and rhythm, S1, S2 normal, no murmur, click, rub or gallop Abdomen: soft, non-tender; bowel sounds normal; no masses,  no organomegaly Pulses: 2+ and symmetric Skin: Skin color, texture, turgor normal. No rashes or lesions Lymph nodes: Cervical, supraclavicular, and axillary nodes normal.  Lab Results  Component Value Date   HGBA1C 7.4 (H) 08/21/2020   HGBA1C 7.0 (H) 05/15/2020   HGBA1C 6.7 (H) 11/10/2019    Lab Results  Component Value Date   CREATININE 1.10 08/21/2020   CREATININE 0.99 05/15/2020   CREATININE 1.00 11/10/2019    Lab Results  Component Value Date   WBC 8.0 07/12/2019   HGB 14.9 07/12/2019   HCT 43.1 07/12/2019   PLT 162 07/12/2019   GLUCOSE 161 (H) 08/21/2020   CHOL 121 08/21/2020   TRIG 75.0 08/21/2020   HDL 44.40 08/21/2020   LDLDIRECT 60.0 09/29/2017   LDLCALC 61 08/21/2020   ALT 31 08/21/2020   AST 25 08/21/2020   NA 141 08/21/2020   K 4.6 08/21/2020   CL 103 08/21/2020   CREATININE 1.10 08/21/2020   BUN 18 08/21/2020   CO2 31 08/21/2020   TSH 2.320 07/09/2019   PSA 0.32 11/10/2019   HGBA1C 7.4 (H) 08/21/2020   MICROALBUR 1.1 05/15/2020    No results found.  Assessment & Plan:   Problem List Items Addressed This Visit      Unprioritized   Hyperlipidemia due to type 2 diabetes mellitus ([place])     He is tolerating the XR Metformin, but his fasting sugars have been > 140 for the past several weeks..  Encouraged to check dinner post prandials for one week.  May need to add 2.5 mg glipizide or Januvia depending on a1c today.  Foot and eye exams are up to date.  Vaccination status complete w/r/t pneumonia vaccines .  He is tlerating stati without myalgias and taking an asa daily as well because of aortic atherosclerosis       Hypertension    Well controlled on current regimen of hctz and amlodipine.  He has a C/I to ARB (hyperkalemia). Renal function stable, no changes today.  Lab Results  Component Value Date   CREATININE 1.10 08/21/2020  Lab Results  Component Value Date   NA 141 08/21/2020   K 4.6 08/21/2020   CL 103 08/21/2020   CO2 31 08/21/2020         Hyperlipidemia associated with type 2 diabetes mellitus ([place])   Relevant Orders   Lipid panel   Overweight (BMI 25.0-29.9)    I have congratulated him  in reduction of   BMI and encouraged  Continued weight loss with goal of 10% of body weigh over the next 6 months using a low glycemic index diet and regular exercise a minimum of 5 days per week.        Abdominal aortic atherosclerosis ([place])    He remains asymptomatic and active.  Continue statin and asa        Other Visit Diagnoses    Need for immunization against influenza    -  Primary   Relevant Orders   Flu Vaccine QUAD High Dose(Fluad) (Completed)      I am having Elliot Dally. Digeronimo start on Zoster Vaccine Adjuvanted. I am also having him maintain his CoQ10, Fish Oil, fluticasone, vitamin C, aspirin EC, CINNAMON PO, Turmeric, OneTouch Verio IQ System, OneTouch Delica Lancets 32G, atorvastatin, amLODipine, OneTouch Verio, multivitamin, MAGNESIUM CITRATE PO, glucosamine-chondroitin, furosemide, hydrochlorothiazide, and metFORMIN.  Meds ordered this encounter  Medications  . Zoster Vaccine Adjuvanted [facility]) injection    Sig: Inject 0.5 mLs into the muscle  once for 1 dose.    Dispense:  1 each    Refill:  1    There are no discontinued medications.  Follow-up: Return in about 3 months (around 02/18/2021) for follow up diabetes.   Crecencio Mc, MD

## 2020-11-21 ENCOUNTER — Other Ambulatory Visit: Payer: Self-pay | Admitting: Internal Medicine

## 2020-11-21 MED ORDER — GLIPIZIDE 5 MG PO TABS: 2.5000 mg | ORAL_TABLET | Freq: Every day | ORAL | 3 refills | Status: DC

## 2020-11-21 NOTE — Progress Notes (Signed)
  Your diabetes is under  diminishing control on current regimen, and your a1c has increased to 7.8 (goal is < 7.00).    please start taking 2.5 mg glipizide before your evening meal.  make sure you are following a low glycemic index diet and continuing exercising daily (at least going for a walk) .    Please send a log off our sugars in 1  Month .  Check your sugars once daily for the next two weeks at any of the following times:   fasting   Or postprandial (2 hr after a meal) at  Different times .   Regards,  Dr. Derrel Nip

## 2020-12-03 DIAGNOSIS — G4733 Obstructive sleep apnea (adult) (pediatric): Secondary | ICD-10-CM | POA: Diagnosis not present

## 2020-12-05 ENCOUNTER — Other Ambulatory Visit: Payer: Self-pay | Admitting: Internal Medicine

## 2020-12-05 DIAGNOSIS — E1169 Type 2 diabetes mellitus with other specified complication: Secondary | ICD-10-CM

## 2020-12-05 DIAGNOSIS — E785 Hyperlipidemia, unspecified: Secondary | ICD-10-CM

## 2020-12-05 MED ORDER — GLIPIZIDE 5 MG PO TABS: 5.0000 mg | ORAL_TABLET | Freq: Two times a day (BID) | ORAL | 1 refills | Status: DC

## 2020-12-05 NOTE — Assessment & Plan Note (Signed)
Advised to increase glipizide to 2.5 mg before  breakfast and 5 mg  Before dinner/

## 2020-12-12 DIAGNOSIS — G4733 Obstructive sleep apnea (adult) (pediatric): Secondary | ICD-10-CM | POA: Diagnosis not present

## 2020-12-20 DIAGNOSIS — M546 Pain in thoracic spine: Secondary | ICD-10-CM | POA: Diagnosis not present

## 2020-12-20 DIAGNOSIS — M955 Acquired deformity of pelvis: Secondary | ICD-10-CM | POA: Diagnosis not present

## 2020-12-20 DIAGNOSIS — M9905 Segmental and somatic dysfunction of pelvic region: Secondary | ICD-10-CM | POA: Diagnosis not present

## 2020-12-20 DIAGNOSIS — M5442 Lumbago with sciatica, left side: Secondary | ICD-10-CM | POA: Diagnosis not present

## 2020-12-20 DIAGNOSIS — M9902 Segmental and somatic dysfunction of thoracic region: Secondary | ICD-10-CM | POA: Diagnosis not present

## 2020-12-20 DIAGNOSIS — M9903 Segmental and somatic dysfunction of lumbar region: Secondary | ICD-10-CM | POA: Diagnosis not present

## 2020-12-21 ENCOUNTER — Other Ambulatory Visit: Payer: Self-pay | Admitting: Internal Medicine

## 2021-01-03 DIAGNOSIS — G4733 Obstructive sleep apnea (adult) (pediatric): Secondary | ICD-10-CM | POA: Diagnosis not present

## 2021-01-07 ENCOUNTER — Other Ambulatory Visit: Payer: Self-pay | Admitting: Internal Medicine

## 2021-01-12 DIAGNOSIS — G4733 Obstructive sleep apnea (adult) (pediatric): Secondary | ICD-10-CM | POA: Diagnosis not present

## 2021-01-16 DIAGNOSIS — M5442 Lumbago with sciatica, left side: Secondary | ICD-10-CM | POA: Diagnosis not present

## 2021-01-16 DIAGNOSIS — M9905 Segmental and somatic dysfunction of pelvic region: Secondary | ICD-10-CM | POA: Diagnosis not present

## 2021-01-16 DIAGNOSIS — M955 Acquired deformity of pelvis: Secondary | ICD-10-CM | POA: Diagnosis not present

## 2021-01-16 DIAGNOSIS — M9902 Segmental and somatic dysfunction of thoracic region: Secondary | ICD-10-CM | POA: Diagnosis not present

## 2021-01-16 DIAGNOSIS — M9903 Segmental and somatic dysfunction of lumbar region: Secondary | ICD-10-CM | POA: Diagnosis not present

## 2021-01-16 DIAGNOSIS — M546 Pain in thoracic spine: Secondary | ICD-10-CM | POA: Diagnosis not present

## 2021-01-17 DIAGNOSIS — D3131 Benign neoplasm of right choroid: Secondary | ICD-10-CM | POA: Diagnosis not present

## 2021-01-17 LAB — HM DIABETES EYE EXAM

## 2021-01-22 ENCOUNTER — Other Ambulatory Visit: Payer: Self-pay | Admitting: Internal Medicine

## 2021-02-03 DIAGNOSIS — G4733 Obstructive sleep apnea (adult) (pediatric): Secondary | ICD-10-CM | POA: Diagnosis not present

## 2021-02-05 ENCOUNTER — Other Ambulatory Visit: Payer: Self-pay | Admitting: Internal Medicine

## 2021-02-05 ENCOUNTER — Ambulatory Visit (INDEPENDENT_AMBULATORY_CARE_PROVIDER_SITE_OTHER): Payer: Medicare HMO | Admitting: Internal Medicine

## 2021-02-05 ENCOUNTER — Encounter: Payer: Self-pay | Admitting: Internal Medicine

## 2021-02-05 ENCOUNTER — Other Ambulatory Visit: Payer: Self-pay

## 2021-02-05 VITALS — BP 120/76 | HR 53 | Temp 97.3°F | Ht 71.0 in | Wt 200.8 lb

## 2021-02-05 DIAGNOSIS — E1169 Type 2 diabetes mellitus with other specified complication: Secondary | ICD-10-CM

## 2021-02-05 DIAGNOSIS — E785 Hyperlipidemia, unspecified: Secondary | ICD-10-CM

## 2021-02-05 DIAGNOSIS — I10 Essential (primary) hypertension: Secondary | ICD-10-CM

## 2021-02-05 LAB — COMPREHENSIVE METABOLIC PANEL
ALT: 33 U/L (ref 0–53)
AST: 25 U/L (ref 0–37)
Albumin: 4.5 g/dL (ref 3.5–5.2)
Alkaline Phosphatase: 58 U/L (ref 39–117)
BUN: 19 mg/dL (ref 6–23)
CO2: 29 mEq/L (ref 19–32)
Calcium: 9.8 mg/dL (ref 8.4–10.5)
Chloride: 104 mEq/L (ref 96–112)
Creatinine, Ser: 1.09 mg/dL (ref 0.40–1.50)
GFR: 65.68 mL/min (ref >60.00)
Glucose, Bld: 139 mg/dL — ABNORMAL HIGH (ref 70–99)
Potassium: 4.8 mEq/L (ref 3.5–5.1)
Sodium: 139 mEq/L (ref 135–145)
Total Bilirubin: 0.8 mg/dL (ref 0.2–1.2)
Total Protein: 6.9 g/dL (ref 6.0–8.3)

## 2021-02-05 LAB — LIPID PANEL
Cholesterol: 111 mg/dL (ref 0–200)
HDL: 49.4 mg/dL (ref >39.00)
LDL Cholesterol: 51 mg/dL (ref 0–99)
NonHDL: 61.22
Total CHOL/HDL Ratio: 2
Triglycerides: 53 mg/dL (ref 0.0–149.0)
VLDL: 10.6 mg/dL (ref 0.0–40.0)

## 2021-02-05 LAB — POCT GLYCOSYLATED HEMOGLOBIN (HGB A1C): Hemoglobin A1C: 6 % — AB (ref 4.0–5.6)

## 2021-02-05 MED ORDER — GLIPIZIDE 5 MG PO TABS
2.5000 mg | ORAL_TABLET | Freq: Two times a day (BID) | ORAL | 1 refills | Status: DC
Start: 2021-02-05 — End: 2021-08-06

## 2021-02-05 MED ORDER — ONETOUCH VERIO VI STRP: ORAL_STRIP | 1 refills | Status: DC

## 2021-02-05 NOTE — Assessment & Plan Note (Addendum)
LDL is at goal on atorvastatin  .  A1c is 6.0 today and he has symptoms of hypoglycemia during his morning walk.  Advised to reduce glipizide to 2.5 mg before  breakfast and continue 2. 5 mg  Before dinner for a1c of 6.0    Lab Results  Component Value Date   HGBA1C 6.0 (A) 02/05/2021   Lab Results  Component Value Date   MICROALBUR 1.1 05/15/2020   MICROALBUR 0.8 05/11/2019

## 2021-02-05 NOTE — Assessment & Plan Note (Addendum)
Well controlled on current regimen of hctz and amlodipine . HE HAS NO PROTEINURIA . Renal function has been stable, no changes today.

## 2021-02-05 NOTE — Progress Notes (Signed)
Subjective:  Patient ID: Richard Hardy, male    DOB: 1946/11/23  Age: 75 y.o. MRN: [id number]  CC: The primary encounter diagnosis was Hyperlipidemia associated with type 2 diabetes mellitus ([place]). Diagnoses of Primary hypertension and Hyperlipidemia due to type 2 diabetes mellitus ([place]) were also pertinent to this visit.  HPI Richard Hardy presents for follow up on type 2 DM, hyperlipidemia and hypertension  This visit occurred during the SARS-CoV-2 public health emergency.  Safety protocols were in place, including screening questions prior to the visit, additional usage of staff PPE, and extensive cleaning of exam room while observing appropriate contact time as indicated for disinfecting solutions.    Down 5 lbs  Walks one hour daily plus more exercise several days per week.  FEELS TREMULOUS DURING WALK SEVERAL DAYS PER WEEK   Type 2 DM:  Taking glipizide 5 mg in am and 2.5 mg in pm.  Taking 500 mg metformin xr.  Frustrated at sugars not being perfect. Checks twice daily.  fastings are < 130 .   Several PP are  200 after eating out (3 in 20) at fast food restaurants .  Takes an hour walk after breakfast, having recurrent episodes of feeling weak and tremulous after     /during walk.  Has not checked sugar during episodes.   EYE EXAM DONE THIS MONTH BY [facility]FIL[facility] AT [facility] NO CHANGE    Outpatient Medications Prior to Visit  Medication Sig Dispense Refill  . amLODipine (NORVASC) 5 MG tablet TAKE 1 TABLET BY MOUTH EVERY DAY 90 tablet 3  . aspirin EC 81 MG tablet Take 1 tablet (81 mg total) by mouth daily. 90 tablet 4  . atorvastatin (LIPITOR) 20 MG tablet TAKE 1 TABLET BY MOUTH EVERY DAY 90 tablet 1  . Blood Glucose Monitoring Suppl (ONETOUCH VERIO IQ SYSTEM) w/Device KIT Use to check blood sugars once daily. 1 kit 0  . CINNAMON PO Take 2 capsules by mouth daily.    . Coenzyme Q10 (COQ10) 200 MG CAPS Take 1 tablet by mouth daily.    . Fish Oil OIL Take by mouth daily.    .  fluticasone (FLONASE) 50 MCG/ACT nasal spray Place into the nose at bedtime.    Marland Kitchen glucosamine-chondroitin 500-400 MG tablet Take 1 tablet by mouth in the morning and at bedtime.    . hydrochlorothiazide (HYDRODIURIL) 25 MG tablet TAKE 1 TABLET BY MOUTH EVERY DAY 90 tablet 3  . MAGNESIUM CITRATE PO Take 1 capsule by mouth in the morning and at bedtime.    . metFORMIN (GLUCOPHAGE-XR) 500 MG 24 hr tablet TAKE 1 TABLET BY MOUTH EVERY DAY WITH BREAKFAST 90 tablet 1  . Multiple Vitamin (MULTIVITAMIN) capsule Take 1 capsule by mouth daily.    Glory Rosebush DELICA LANCETS 68T MISC Use to check blood sugars once daily. 100 each 1  . Turmeric 500 MG TABS Take 1 tablet by mouth daily.    . vitamin C (ASCORBIC ACID) 500 MG tablet Take 500 mg by mouth daily.    . furosemide (LASIX) 20 MG tablet AS NEEDED FOR SWELLING EVERY 3 DAYS 90 tablet 1  . glipiZIDE (GLUCOTROL) 5 MG tablet TAKE 1 TABLET (5 MG TOTAL) BY MOUTH 2 (TWO) TIMES DAILY BEFORE A MEAL. 180 tablet 1  . ONETOUCH VERIO test strip USE TO CHECK SUGARS ONCE DAILY. 100 strip 1   No facility-administered medications prior to visit.    Review of Systems;  Patient denies headache, fevers, malaise, unintentional  weight loss, skin rash, eye pain, sinus congestion and sinus pain, sore throat, dysphagia,  hemoptysis , cough, dyspnea, wheezing, chest pain, palpitations, orthopnea, edema, abdominal pain, nausea, melena, diarrhea, constipation, flank pain, dysuria, hematuria, urinary  Frequency, nocturia, numbness, tingling, seizures,  Focal weakness, Loss of consciousness,  Tremor, insomnia, depression, anxiety, and suicidal ideation.      Objective:  BP 120/76 (BP Location: Left Arm, Patient Position: Sitting, Cuff Size: Normal)   Pulse (!) 53   Temp (!) 97.3 F (36.3 C) (Oral)   Ht _0  (1.803 m)   Wt 200 lb 12.8 oz (91.1 kg)   SpO2 95%   BMI 28.01 kg/m   BP Readings from Last 3 Encounters:  02/05/21 120/76  11/20/20 128/78  10/16/20 (!) 99/56     Wt Readings from Last 3 Encounters:  02/05/21 200 lb 12.8 oz (91.1 kg)  11/20/20 205 lb (93 kg)  10/16/20 210 lb (95.3 kg)    General appearance: alert, cooperative and appears stated age Ears: normal TM's and external ear canals both ears Throat: lips, mucosa, and tongue normal; teeth and gums normal Neck: no adenopathy, no carotid bruit, supple, symmetrical, trachea midline and thyroid not enlarged, symmetric, no tenderness/mass/nodules Back: symmetric, no curvature. ROM normal. No CVA tenderness. Lungs: clear to auscultation bilaterally Heart: regular rate and rhythm, S1, S2 normal, no murmur, click, rub or gallop Abdomen: soft, non-tender; bowel sounds normal; no masses,  no organomegaly Pulses: 2+ and symmetric Skin: Skin color, texture, turgor normal. No rashes or lesions Lymph nodes: Cervical, supraclavicular, and axillary nodes normal.  Lab Results  Component Value Date   HGBA1C 6.0 (A) 02/05/2021   HGBA1C 7.8 (H) 11/20/2020   HGBA1C 7.4 (H) 08/21/2020    Lab Results  Component Value Date   CREATININE 1.05 11/20/2020   CREATININE 1.10 08/21/2020   CREATININE 0.99 05/15/2020    Lab Results  Component Value Date   WBC 8.0 07/12/2019   HGB 14.9 07/12/2019   HCT 43.1 07/12/2019   PLT 162 07/12/2019   GLUCOSE 176 (H) 11/20/2020   CHOL 107 11/20/2020   TRIG 74.0 11/20/2020   HDL 43.20 11/20/2020   LDLDIRECT 60.0 09/29/2017   LDLCALC 49 11/20/2020   ALT 34 11/20/2020   AST 26 11/20/2020   NA 140 11/20/2020   K 4.2 11/20/2020   CL 103 11/20/2020   CREATININE 1.05 11/20/2020   BUN 15 11/20/2020   CO2 31 11/20/2020   TSH 2.320 07/09/2019   PSA 0.32 11/10/2019   HGBA1C 6.0 (A) 02/05/2021   MICROALBUR 1.1 05/15/2020    No results found.  Assessment & Plan:   Problem List Items Addressed This Visit      Unprioritized   Hypertension    Well controlled on current regimen of hctz and amlodipine . HE HAS NO PROTEINURIA . Renal function has been stable,  no changes today.      Hyperlipidemia due to type 2 diabetes mellitus ([facility])    LDL is at goal on atorvastatin  .  A1c is 6.0 today and he has symptoms of hypoglycemia during his morning walk.  Advised to reduce glipizide to 2.5 mg before  breakfast and continue 2. 5 mg  Before dinner for a1c of 6.0    Lab Results  Component Value Date   HGBA1C 6.0 (A) 02/05/2021   Lab Results  Component Value Date   MICROALBUR 1.1 05/15/2020   MICROALBUR 0.8 05/11/2019           Relevant  Medications   glipiZIDE (GLUCOTROL) 5 MG tablet   RESOLVED: Hyperlipidemia associated with type 2 diabetes mellitus ([facility]) - Primary   Relevant Medications   glipiZIDE (GLUCOTROL) 5 MG tablet   Other Relevant Orders   POCT HgB A1C (Completed)   Lipid panel   Comprehensive metabolic panel      I provided  30 minutes of  face-to-face time during this encounter reviewing patient's current problems and past surgeries, labs and imaging studies, providing counseling on the above mentioned problems , and coordination  of care . I have changed Elliot Dally. Daft's OneTouch Verio and glipiZIDE. I am also having him maintain his CoQ10, Fish Oil, fluticasone, vitamin C, aspirin EC, CINNAMON PO, Turmeric, OneTouch Verio IQ System, OneTouch Delica Lancets 84C, amLODipine, multivitamin, MAGNESIUM CITRATE PO, glucosamine-chondroitin, hydrochlorothiazide, metFORMIN, and atorvastatin.  Meds ordered this encounter  Medications  . glucose blood (ONETOUCH VERIO) test strip    Sig: USE TO CHECK SUGARS ONCE OR TWICE DAILY .    Dispense:  100 strip    Refill:  1  . glipiZIDE (GLUCOTROL) 5 MG tablet    Sig: Take 0.5 tablets (2.5 mg total) by mouth 2 (two) times daily before a meal.    Dispense:  90 tablet    Refill:  1    NOTE DOSE REDUCTION    Medications Discontinued During This Encounter  Medication Reason  . ONETOUCH VERIO test strip Reorder  . glipiZIDE (GLUCOTROL) 5 MG tablet     Follow-up: Return in about 3 months  (around 05/05/2021) for follow up diabetes.   Crecencio Mc, MD

## 2021-02-05 NOTE — Patient Instructions (Addendum)
YOUR DIABETES IS UNDER EXCELLENT CONTROL!!!1  REDUCE YOUR  MORNING GLIPIZIDE DOSE TO 1/2  TABLET  AND CONTINUE 1/2 TABLET BEFORE EVENING MEAL  IF YOU ARE GONG TO EAT FRIED SEAFOOD OR CHIK FIL A ,  YOU CAN TAKE A FULL TABLET OF GLIPIZIDE BEFORE THAT EVENING MEAL   YOU CAN TAKE THE METFORMIN ONCE DAILY AT WHATEVER TIME YOU WISH   INSTEAD OF CHECKING FASTING  CHECK  2 HRS AFTER BREAKFAST

## 2021-02-08 ENCOUNTER — Ambulatory Visit: Payer: Medicare HMO | Admitting: Dermatology

## 2021-02-08 ENCOUNTER — Other Ambulatory Visit: Payer: Self-pay

## 2021-02-08 ENCOUNTER — Encounter: Payer: Self-pay | Admitting: Dermatology

## 2021-02-08 DIAGNOSIS — L814 Other melanin hyperpigmentation: Secondary | ICD-10-CM

## 2021-02-08 DIAGNOSIS — L821 Other seborrheic keratosis: Secondary | ICD-10-CM

## 2021-02-08 DIAGNOSIS — L820 Inflamed seborrheic keratosis: Secondary | ICD-10-CM | POA: Diagnosis not present

## 2021-02-08 DIAGNOSIS — Z85828 Personal history of other malignant neoplasm of skin: Secondary | ICD-10-CM | POA: Diagnosis not present

## 2021-02-08 DIAGNOSIS — L570 Actinic keratosis: Secondary | ICD-10-CM

## 2021-02-08 DIAGNOSIS — D229 Melanocytic nevi, unspecified: Secondary | ICD-10-CM

## 2021-02-08 DIAGNOSIS — D1721 Benign lipomatous neoplasm of skin and subcutaneous tissue of right arm: Secondary | ICD-10-CM | POA: Diagnosis not present

## 2021-02-08 DIAGNOSIS — D485 Neoplasm of uncertain behavior of skin: Secondary | ICD-10-CM | POA: Diagnosis not present

## 2021-02-08 DIAGNOSIS — Z1283 Encounter for screening for malignant neoplasm of skin: Secondary | ICD-10-CM | POA: Diagnosis not present

## 2021-02-08 DIAGNOSIS — I872 Venous insufficiency (chronic) (peripheral): Secondary | ICD-10-CM

## 2021-02-08 DIAGNOSIS — L578 Other skin changes due to chronic exposure to nonionizing radiation: Secondary | ICD-10-CM | POA: Diagnosis not present

## 2021-02-08 DIAGNOSIS — D1800 Hemangioma unspecified site: Secondary | ICD-10-CM

## 2021-02-08 NOTE — Patient Instructions (Signed)

## 2021-02-08 NOTE — Progress Notes (Signed)
Follow-Up Visit   Subjective  Richard Hardy is a 75 y.o. male who presents for the following: Annual Exam (Total body exam today. Hx of BCC. No hx of dysplastic nevi. Pt has spots on his left shoulder and right cheek that he would like checked today. ). Patient here for full body skin exam and skin cancer screening.  The following portions of the chart were reviewed this encounter and updated as appropriate:  Tobacco  Allergies  Meds  Problems  Med Hx  Surg Hx  Fam Hx     Review of Systems: No other skin or systemic complaints except as noted in HPI or Assessment and Plan.  Objective  Well appearing patient in no apparent distress; mood and affect are within normal limits.  A full examination was performed including scalp, head, eyes, ears, nose, lips, neck, chest, axillae, abdomen, back, buttocks, bilateral upper extremities, bilateral lower extremities, hands, feet, fingers, toes, fingernails, and toenails. All findings within normal limits unless otherwise noted below.  Objective  Left Shoulder - Anterior: 0.6 cm crusted papule   Objective  right posterior shoulder x 1, left forearm x 1 (2): Erythematous keratotic or waxy stuck-on papule or plaque.   Objective  face and ears x 6, hands x 14 (20): Erythematous thin papules/macules with gritty scale.   Objective  Right posterior deltoid: 4 cm rubbery papule  Objective  b/l legs: Erythematous, scaly patches involving the ankle and distal lower leg with associated lower leg edema. With purpura   Assessment & Plan  Neoplasm of uncertain behavior of skin Left Shoulder - Anterior Epidermal / dermal shaving  Lesion diameter (cm):  0.6 Informed consent: discussed and consent obtained   Timeout: patient name, date of birth, surgical site, and procedure verified   Procedure prep:  Patient was prepped and draped in usual sterile fashion Prep type:  Isopropyl alcohol Anesthesia: the lesion was anesthetized in a  standard fashion   Anesthetic:  1% lidocaine w/ epinephrine 1-100,000 buffered w/ 8.4% NaHCO3 Instrument used: flexible razor blade   Hemostasis achieved with: pressure, aluminum chloride and electrodesiccation   Outcome: patient tolerated procedure well   Post-procedure details: sterile dressing applied and wound care instructions given   Dressing type: bandage and petrolatum    Destruction of lesion Complexity: extensive   Destruction method: electrodesiccation and curettage   Informed consent: discussed and consent obtained   Timeout:  patient name, date of birth, surgical site, and procedure verified Procedure prep:  Patient was prepped and draped in usual sterile fashion Prep type:  Isopropyl alcohol Anesthesia: the lesion was anesthetized in a standard fashion   Anesthetic:  1% lidocaine w/ epinephrine 1-100,000 buffered w/ 8.4% NaHCO3 Curettage performed in three different directions: Yes   Electrodesiccation performed over the curetted area: Yes   Hemostasis achieved with:  pressure and aluminum chloride Outcome: patient tolerated procedure well with no complications   Post-procedure details: sterile dressing applied and wound care instructions given   Dressing type: bandage and petrolatum    Specimen 1 - Surgical pathology Differential Diagnosis: r/o SCC  Check Margins: Yes 0.6 crusted papule  Inflamed seborrheic keratosis (2) right posterior shoulder x 1, left forearm x 1 Prior to procedure, discussed risks of blister formation, small wound, skin dyspigmentation, or rare scar following cryotherapy.   Destruction of lesion - right posterior shoulder x 1, left forearm x 1 Complexity: simple   Destruction method: cryotherapy   Informed consent: discussed and consent obtained   Timeout:  patient name, date of birth, surgical site, and procedure verified Lesion destroyed using liquid nitrogen: Yes   Region frozen until ice ball extended beyond lesion: Yes   Outcome:  patient tolerated procedure well with no complications   Post-procedure details: wound care instructions given    AK (actinic keratosis) (20) face and ears x 6, hands x 14 Prior to procedure, discussed risks of blister formation, small wound, skin dyspigmentation, or rare scar following cryotherapy.   Destruction of lesion - face and ears x 6, hands x 14 Complexity: simple   Destruction method: cryotherapy   Informed consent: discussed and consent obtained   Timeout:  patient name, date of birth, surgical site, and procedure verified Lesion destroyed using liquid nitrogen: Yes   Region frozen until ice ball extended beyond lesion: Yes   Outcome: patient tolerated procedure well with no complications   Post-procedure details: wound care instructions given    Lipoma of right upper extremity Right posterior deltoid Benign-appearing.  Observation.  Call clinic for new or changing lesions.  Recommend daily use of broad spectrum spf 30+ sunscreen to sun-exposed areas.   Stasis dermatitis of both legs b/l legs The patient will observe these symptoms, and report promptly any worsening or unexpected persistence.    Skin cancer screening   Lentigines - Scattered tan macules - Due to sun exposure - Benign-appering, observe - Recommend daily broad spectrum sunscreen SPF 30+ to sun-exposed areas, reapply every 2 hours as needed. - Call for any changes  Seborrheic Keratoses - Stuck-on, waxy, tan-brown papules and plaques  - Discussed benign etiology and prognosis. - Observe - Call for any changes  Melanocytic Nevi - Tan-brown and/or pink-flesh-colored symmetric macules and papules - Benign appearing on exam today - Observation - Call clinic for new or changing moles - Recommend daily use of broad spectrum spf 30+ sunscreen to sun-exposed areas.   Hemangiomas - Red papules - Discussed benign nature - Observe - Call for any changes  Actinic Damage - Chronic, secondary to  cumulative UV/sun exposure - diffuse scaly erythematous macules with underlying dyspigmentation - Recommend daily broad spectrum sunscreen SPF 30+ to sun-exposed areas, reapply every 2 hours as needed.  - Call for new or changing lesions. Severe, Confluent Chronic Actinic Changes with Pre-Cancerous Actinic Keratoses due to cumulative sun exposure/UV radiation exposure over time - Discussed Prescription "Field Treatment" Field treatment involves treatment of an entire area of skin that has confluent Actinic Changes (Sun/ Ultraviolet light damage) and PreCancerous Actinic Keratoses by method of PhotoDynamic Therapy (PDT) and/or prescription Topical Chemotherapy agents such as 5-fluorouracil, 5-fluorouracil/calcipotriene, and/or imiquimod.  The purpose is to decrease the number of clinically evident and subclinical PreCancerous lesions to prevent progression to development of skin cancer by chemically destroying early precancer changes that may or may not be visible.  It has been shown to reduce the risk of developing skin cancer in the treated area. As a result of treatment, redness, scaling, crusting, and open sores may occur during treatment course. One or more than one of these methods may be used and may have to be used several times to control, suppress and eliminate the PreCancerous changes. Discussed treatment course, expected reaction, and possible side effects. Will consider PDT or topical field tx at next visit.  Skin cancer screening performed today.  History of Basal Cell Carcinoma of the Skin - No evidence of recurrence today - Recommend regular full body skin exams - Recommend daily broad spectrum sunscreen SPF 30+ to sun-exposed areas, reapply  every 2 hours as needed.  - Call if any new or changing lesions are noted between office visits  Actinic Damage - chronic, secondary to cumulative UV radiation exposure/sun exposure over time - diffuse scaly erythematous macules with underlying  dyspigmentation - Recommend daily broad spectrum sunscreen SPF 30+ to sun-exposed areas, reapply every 2 hours as needed.  - Recommend staying in the shade or wearing long sleeves, sun glasses (UVA+UVB protection) and wide brim hats (4-inch brim around the entire circumference of the hat). - Call for new or changing lesions.  Return in about 6 months (around 08/11/2021).   IHarriett Sine, CMA, am acting as scribe for Sarina Ser, MD.  Documentation: I have reviewed the above documentation for accuracy and completeness, and I agree with the above.  Sarina Ser, MD

## 2021-02-09 DIAGNOSIS — G4733 Obstructive sleep apnea (adult) (pediatric): Secondary | ICD-10-CM | POA: Diagnosis not present

## 2021-02-12 ENCOUNTER — Telehealth: Payer: Self-pay

## 2021-02-12 NOTE — Telephone Encounter (Signed)
LM on VM please return my call  

## 2021-02-12 NOTE — Telephone Encounter (Signed)
-----   Message from Ralene Bathe, MD sent at 02/12/2021  8:59 AM EST ----- Diagnosis Skin , left shoulder-anterior SEBORRHEIC KERATOSIS, IRRITATED, TRAUMATIZED  Benign inflamed keratosis No further treatment needed

## 2021-02-12 NOTE — Telephone Encounter (Signed)
Discussed biopsy results with pt  °

## 2021-02-13 DIAGNOSIS — M546 Pain in thoracic spine: Secondary | ICD-10-CM | POA: Diagnosis not present

## 2021-02-13 DIAGNOSIS — M9905 Segmental and somatic dysfunction of pelvic region: Secondary | ICD-10-CM | POA: Diagnosis not present

## 2021-02-13 DIAGNOSIS — M5442 Lumbago with sciatica, left side: Secondary | ICD-10-CM | POA: Diagnosis not present

## 2021-02-13 DIAGNOSIS — M9902 Segmental and somatic dysfunction of thoracic region: Secondary | ICD-10-CM | POA: Diagnosis not present

## 2021-02-13 DIAGNOSIS — M9903 Segmental and somatic dysfunction of lumbar region: Secondary | ICD-10-CM | POA: Diagnosis not present

## 2021-02-13 DIAGNOSIS — M955 Acquired deformity of pelvis: Secondary | ICD-10-CM | POA: Diagnosis not present

## 2021-03-03 DIAGNOSIS — G4733 Obstructive sleep apnea (adult) (pediatric): Secondary | ICD-10-CM | POA: Diagnosis not present

## 2021-03-12 DIAGNOSIS — G4733 Obstructive sleep apnea (adult) (pediatric): Secondary | ICD-10-CM | POA: Diagnosis not present

## 2021-03-17 ENCOUNTER — Other Ambulatory Visit: Payer: Self-pay | Admitting: Internal Medicine

## 2021-03-20 DIAGNOSIS — M9902 Segmental and somatic dysfunction of thoracic region: Secondary | ICD-10-CM | POA: Diagnosis not present

## 2021-03-20 DIAGNOSIS — M955 Acquired deformity of pelvis: Secondary | ICD-10-CM | POA: Diagnosis not present

## 2021-03-20 DIAGNOSIS — M5442 Lumbago with sciatica, left side: Secondary | ICD-10-CM | POA: Diagnosis not present

## 2021-03-20 DIAGNOSIS — M9905 Segmental and somatic dysfunction of pelvic region: Secondary | ICD-10-CM | POA: Diagnosis not present

## 2021-03-20 DIAGNOSIS — M9903 Segmental and somatic dysfunction of lumbar region: Secondary | ICD-10-CM | POA: Diagnosis not present

## 2021-03-20 DIAGNOSIS — M546 Pain in thoracic spine: Secondary | ICD-10-CM | POA: Diagnosis not present

## 2021-04-03 DIAGNOSIS — G4733 Obstructive sleep apnea (adult) (pediatric): Secondary | ICD-10-CM | POA: Diagnosis not present

## 2021-04-17 DIAGNOSIS — M9902 Segmental and somatic dysfunction of thoracic region: Secondary | ICD-10-CM | POA: Diagnosis not present

## 2021-04-17 DIAGNOSIS — M9903 Segmental and somatic dysfunction of lumbar region: Secondary | ICD-10-CM | POA: Diagnosis not present

## 2021-04-17 DIAGNOSIS — M546 Pain in thoracic spine: Secondary | ICD-10-CM | POA: Diagnosis not present

## 2021-04-17 DIAGNOSIS — M5442 Lumbago with sciatica, left side: Secondary | ICD-10-CM | POA: Diagnosis not present

## 2021-04-17 DIAGNOSIS — M955 Acquired deformity of pelvis: Secondary | ICD-10-CM | POA: Diagnosis not present

## 2021-04-17 DIAGNOSIS — M9905 Segmental and somatic dysfunction of pelvic region: Secondary | ICD-10-CM | POA: Diagnosis not present

## 2021-05-10 ENCOUNTER — Ambulatory Visit (INDEPENDENT_AMBULATORY_CARE_PROVIDER_SITE_OTHER): Payer: Medicare HMO | Admitting: Internal Medicine

## 2021-05-10 ENCOUNTER — Other Ambulatory Visit: Payer: Self-pay

## 2021-05-10 ENCOUNTER — Encounter: Payer: Self-pay | Admitting: Internal Medicine

## 2021-05-10 VITALS — BP 126/76 | HR 43 | Temp 96.9°F | Resp 14 | Ht 71.0 in | Wt 202.2 lb

## 2021-05-10 DIAGNOSIS — E1169 Type 2 diabetes mellitus with other specified complication: Secondary | ICD-10-CM | POA: Diagnosis not present

## 2021-05-10 DIAGNOSIS — E785 Hyperlipidemia, unspecified: Secondary | ICD-10-CM

## 2021-05-10 DIAGNOSIS — I700 Atherosclerosis of aorta: Secondary | ICD-10-CM | POA: Diagnosis not present

## 2021-05-10 DIAGNOSIS — I10 Essential (primary) hypertension: Secondary | ICD-10-CM

## 2021-05-10 LAB — VITAMIN B12: Vitamin B-12: 1240 pg/mL — ABNORMAL HIGH (ref 211–911)

## 2021-05-10 LAB — HEMOGLOBIN A1C: Hgb A1c MFr Bld: 6.3 % (ref 4.6–6.5)

## 2021-05-10 MED ORDER — FUROSEMIDE 20 MG PO TABS
10.0000 mg | ORAL_TABLET | Freq: Every day | ORAL | 1 refills | Status: DC
Start: 2021-05-10 — End: 2021-08-08

## 2021-05-10 NOTE — Progress Notes (Signed)
Subjective:  Patient ID: Richard Hardy, male    DOB: October 03, 1946  Age: 75 y.o. MRN: [id number]  CC: The primary encounter diagnosis was Hyperlipidemia due to type 2 diabetes mellitus ([place]). Diagnoses of Abdominal aortic atherosclerosis ([place]) and Primary hypertension were also pertinent to this visit.  HPI Richard Hardy presents for follow up on type 2 dm , hypertension and hyperlipidemia  This visit occurred during the SARS-CoV-2 public health emergency.  Safety protocols were in place, including screening questions prior to the visit, additional usage of staff PPE, and extensive cleaning of exam room while observing appropriate contact time as indicated for disinfecting solutions.   He feels generally well, is exercising 6 or 7  times per week and checking blood sugars once daily at variable times.  BS have been under 130 fasting and < 150 post prandially.  Has had several  hypoglyemic events in late morning due to exercising .  Taking glipizide 2.5 mg bid and metformin 500 mg daily  as directed. Following a carbohydrate modified diet 6 days per week. Denies numbness, burning and tingling of extremities. Appetite is good.    Reviewed findings of prior CT scan today..  Patient is tolerating high potency statin therapy   Hypertension: patient checks blood pressure twice weekly at home.  Readings have been for the most part <140/80 at rest . Patient is following a reduce salt diet most days and is taking medications as prescribed (amlodipine 5 mg daily and hctz 25 ng daily )    Outpatient Medications Prior to Visit  Medication Sig Dispense Refill  . amLODipine (NORVASC) 5 MG tablet TAKE 1 TABLET BY MOUTH EVERY DAY 90 tablet 3  . amoxicillin (AMOXIL) 500 MG capsule Take 500 mg by mouth 4 (four) times daily.    Marland Kitchen aspirin EC 81 MG tablet Take 1 tablet (81 mg total) by mouth daily. 90 tablet 4  . atorvastatin (LIPITOR) 20 MG tablet TAKE 1 TABLET BY MOUTH EVERY DAY 90 tablet 1  . Blood Glucose  Monitoring Suppl (ONETOUCH VERIO IQ SYSTEM) w/Device KIT Use to check blood sugars once daily. 1 kit 0  . chlorhexidine (PERIDEX) 0.12 % solution SMARTSIG:0.5 Ounce(s) By Mouth Twice Daily    . CINNAMON PO Take 2 capsules by mouth daily.    . Coenzyme Q10 (COQ10) 200 MG CAPS Take 1 tablet by mouth daily.    . Fish Oil OIL Take by mouth daily.    . fluticasone (FLONASE) 50 MCG/ACT nasal spray Place into the nose at bedtime.    Marland Kitchen glipiZIDE (GLUCOTROL) 5 MG tablet Take 0.5 tablets (2.5 mg total) by mouth 2 (two) times daily before a meal. 90 tablet 1  . glucosamine-chondroitin 500-400 MG tablet Take 1 tablet by mouth in the morning and at bedtime.    Marland Kitchen glucose blood (ONETOUCH VERIO) test strip USE TO CHECK SUGARS ONCE OR TWICE DAILY . 100 strip 1  . hydrochlorothiazide (HYDRODIURIL) 25 MG tablet TAKE 1 TABLET BY MOUTH EVERY DAY 90 tablet 3  . MAGNESIUM CITRATE PO Take 1 capsule by mouth in the morning and at bedtime.    . metFORMIN (GLUCOPHAGE-XR) 500 MG 24 hr tablet TAKE 1 TABLET BY MOUTH EVERY DAY WITH BREAKFAST 90 tablet 1  . Multiple Vitamin (MULTIVITAMIN) capsule Take 1 capsule by mouth daily.    Glory Rosebush DELICA LANCETS 40G MISC Use to check blood sugars once daily. 100 each 1  . Turmeric 500 MG TABS Take 1 tablet by mouth daily.    Marland Kitchen  vitamin C (ASCORBIC ACID) 500 MG tablet Take 500 mg by mouth daily.    . furosemide (LASIX) 20 MG tablet AS NEEDED FOR SWELLING EVERY 3 DAYS 90 tablet 1   No facility-administered medications prior to visit.    Review of Systems;  Patient denies headache, fevers, malaise, unintentional weight loss, skin rash, eye pain, sinus congestion and sinus pain, sore throat, dysphagia,  hemoptysis , cough, dyspnea, wheezing, chest pain, palpitations, orthopnea, edema, abdominal pain, nausea, melena, diarrhea, constipation, flank pain, dysuria, hematuria, urinary  Frequency, nocturia, numbness, tingling, seizures,  Focal weakness, Loss of consciousness,  Tremor,  insomnia, depression, anxiety, and suicidal ideation.      Objective:  BP 126/76 (BP Location: Left Arm, Patient Position: Sitting, Cuff Size: Large)   Pulse (!) 43   Temp (!) 96.9 F (36.1 C) (Temporal)   Resp 14   Ht _0  (1.803 m)   Wt 202 lb 3.2 oz (91.7 kg)   SpO2 96%   BMI 28.20 kg/m   BP Readings from Last 3 Encounters:  05/10/21 126/76  02/05/21 120/76  11/20/20 128/78    Wt Readings from Last 3 Encounters:  05/10/21 202 lb 3.2 oz (91.7 kg)  02/05/21 200 lb 12.8 oz (91.1 kg)  11/20/20 205 lb (93 kg)    General appearance: alert, cooperative and appears stated age Ears: normal TM's and external ear canals both ears Throat: lips, mucosa, and tongue normal; teeth and gums normal Neck: no adenopathy, no carotid bruit, supple, symmetrical, trachea midline and thyroid not enlarged, symmetric, no tenderness/mass/nodules Back: symmetric, no curvature. ROM normal. No CVA tenderness. Lungs: clear to auscultation bilaterally Heart: regular rate and rhythm, S1, S2 normal, no murmur, click, rub or gallop Abdomen: soft, non-tender; bowel sounds normal; no masses,  no organomegaly Pulses: 2+ and symmetric Skin: Skin color, texture, turgor normal. No rashes or lesions Lymph nodes: Cervical, supraclavicular, and axillary nodes normal.  Lab Results  Component Value Date   HGBA1C 6.0 (A) 02/05/2021   HGBA1C 7.8 (H) 11/20/2020   HGBA1C 7.4 (H) 08/21/2020    Lab Results  Component Value Date   CREATININE 1.09 02/05/2021   CREATININE 1.05 11/20/2020   CREATININE 1.10 08/21/2020    Lab Results  Component Value Date   WBC 8.0 07/12/2019   HGB 14.9 07/12/2019   HCT 43.1 07/12/2019   PLT 162 07/12/2019   GLUCOSE 139 (H) 02/05/2021   CHOL 111 02/05/2021   TRIG 53.0 02/05/2021   HDL 49.40 02/05/2021   LDLDIRECT 60.0 09/29/2017   LDLCALC 51 02/05/2021   ALT 33 02/05/2021   AST 25 02/05/2021   NA 139 02/05/2021   K 4.8 02/05/2021   CL 104 02/05/2021   CREATININE  1.09 02/05/2021   BUN 19 02/05/2021   CO2 29 02/05/2021   TSH 2.320 07/09/2019   PSA 0.32 11/10/2019   HGBA1C 6.0 (A) 02/05/2021   MICROALBUR 1.1 05/15/2020    No results found.  Assessment & Plan:   Problem List Items Addressed This Visit      Unprioritized   Abdominal aortic atherosclerosis ([place])    Reviewed findings of prior CT scan today..  Patient is tolerating high potency statin therapy       Relevant Medications   furosemide (LASIX) 20 MG tablet   Hyperlipidemia due to type 2 diabetes mellitus ([place]) - Primary    LDL is at goal on atorvastatin  .  A1c is 6.0 today and he has symptoms of hypoglycemia during his morning walk.  Advised to reduce glipizide to 2.5 mg before  breakfast and continue 2. 5 mg  Before dinner for a1c of 6.0    Lab Results  Component Value Date   HGBA1C 6.0 (A) 02/05/2021   Lab Results  Component Value Date   MICROALBUR 1.1 05/15/2020   MICROALBUR 0.8 05/11/2019           Relevant Medications   furosemide (LASIX) 20 MG tablet   Other Relevant Orders   Comprehensive metabolic panel   Vitamin F79   Hemoglobin A1c   LDL cholesterol, direct   Lipid panel   Microalbumin / creatinine urine ratio   Hypertension    Well controlled on current regimen of hctz and amlodipine . HE HAS NO PROTEINURIA . Renal function has been stable, no changes today.  Lab Results  Component Value Date   CREATININE 1.09 02/05/2021   Lab Results  Component Value Date   NA 139 02/05/2021   K 4.8 02/05/2021   CL 104 02/05/2021   CO2 29 02/05/2021         Relevant Medications   furosemide (LASIX) 20 MG tablet      I have changed Elliot Dally. Badia's furosemide. I am also having him maintain his CoQ10, Fish Oil, fluticasone, vitamin C, aspirin EC, CINNAMON PO, Turmeric, OneTouch Verio IQ System, OneTouch Delica Lancets 03Y, multivitamin, MAGNESIUM CITRATE PO, glucosamine-chondroitin, hydrochlorothiazide, metFORMIN, atorvastatin, OneTouch Verio, glipiZIDE,  amLODipine, amoxicillin, and chlorhexidine.  Meds ordered this encounter  Medications  . furosemide (LASIX) 20 MG tablet    Sig: Take 0.5 tablets (10 mg total) by mouth daily.    Dispense:  90 tablet    Refill:  1    KEEP ON FILE FOR FUTURE REFILLS    Medications Discontinued During This Encounter  Medication Reason  . furosemide (LASIX) 20 MG tablet     Follow-up: Return in about 6 months (around 11/09/2021) for follow up diabetes.   Crecencio Mc, MD

## 2021-05-10 NOTE — Patient Instructions (Addendum)
YOU ARE DOING GREAT!   CHANGE YOUR MORNING GLIPIZIDE TO LUNCHTIME GLIPIZIDE 1/2 TABLET  To avoid late morning low blood sugars   CONTINUE 1/2 TABLET AT [facility]

## 2021-05-11 LAB — COMPREHENSIVE METABOLIC PANEL
ALT: 29 U/L (ref 0–53)
AST: 26 U/L (ref 0–37)
Albumin: 4.3 g/dL (ref 3.5–5.2)
Alkaline Phosphatase: 58 U/L (ref 39–117)
BUN: 21 mg/dL (ref 6–23)
CO2: 26 mEq/L (ref 19–32)
Calcium: 9.2 mg/dL (ref 8.4–10.5)
Chloride: 105 mEq/L (ref 96–112)
Creatinine, Ser: 1.03 mg/dL (ref 0.40–1.50)
GFR: 70.17 mL/min (ref >60.00)
Glucose, Bld: 121 mg/dL — ABNORMAL HIGH (ref 70–99)
Potassium: 4.3 mEq/L (ref 3.5–5.1)
Sodium: 142 mEq/L (ref 135–145)
Total Bilirubin: 0.8 mg/dL (ref 0.2–1.2)
Total Protein: 6.4 g/dL (ref 6.0–8.3)

## 2021-05-11 LAB — LIPID PANEL
Cholesterol: 105 mg/dL (ref 0–200)
HDL: 45.2 mg/dL (ref >39.00)
LDL Cholesterol: 48 mg/dL (ref 0–99)
NonHDL: 59.34
Total CHOL/HDL Ratio: 2
Triglycerides: 56 mg/dL (ref 0.0–149.0)
VLDL: 11.2 mg/dL (ref 0.0–40.0)

## 2021-05-11 LAB — LDL CHOLESTEROL, DIRECT: Direct LDL: 48 mg/dL

## 2021-05-12 ENCOUNTER — Other Ambulatory Visit: Payer: Self-pay | Admitting: Internal Medicine

## 2021-05-12 NOTE — Assessment & Plan Note (Signed)
Reviewed findings of prior CT scan today..  Patient is tolerating high potency statin therapy  

## 2021-05-12 NOTE — Assessment & Plan Note (Signed)
LDL is at goal on atorvastatin  .  A1c is 6.0 today and he has symptoms of hypoglycemia during his morning walk.  Advised to reduce glipizide to 2.5 mg before  breakfast and continue 2. 5 mg  Before dinner for a1c of 6.0    Lab Results  Component Value Date   HGBA1C 6.0 (A) 02/05/2021   Lab Results  Component Value Date   MICROALBUR 1.1 05/15/2020   MICROALBUR 0.8 05/11/2019

## 2021-05-12 NOTE — Assessment & Plan Note (Signed)
Well controlled on current regimen of hctz and amlodipine . HE HAS NO PROTEINURIA . Renal function has been stable, no changes today.  Lab Results  Component Value Date   CREATININE 1.09 02/05/2021   Lab Results  Component Value Date   NA 139 02/05/2021   K 4.8 02/05/2021   CL 104 02/05/2021   CO2 29 02/05/2021

## 2021-05-14 LAB — MICROALBUMIN / CREATININE URINE RATIO
Creatinine,U: 163.9 mg/dL
Microalb Creat Ratio: 0.7 mg/g (ref 0.0–30.0)
Microalb, Ur: 1.2 mg/dL (ref 0.0–1.9)

## 2021-05-22 DIAGNOSIS — M9903 Segmental and somatic dysfunction of lumbar region: Secondary | ICD-10-CM | POA: Diagnosis not present

## 2021-05-22 DIAGNOSIS — M9905 Segmental and somatic dysfunction of pelvic region: Secondary | ICD-10-CM | POA: Diagnosis not present

## 2021-05-22 DIAGNOSIS — M546 Pain in thoracic spine: Secondary | ICD-10-CM | POA: Diagnosis not present

## 2021-05-22 DIAGNOSIS — M9902 Segmental and somatic dysfunction of thoracic region: Secondary | ICD-10-CM | POA: Diagnosis not present

## 2021-05-22 DIAGNOSIS — M955 Acquired deformity of pelvis: Secondary | ICD-10-CM | POA: Diagnosis not present

## 2021-05-22 DIAGNOSIS — M5442 Lumbago with sciatica, left side: Secondary | ICD-10-CM | POA: Diagnosis not present

## 2021-06-14 ENCOUNTER — Other Ambulatory Visit: Payer: Self-pay | Admitting: Internal Medicine

## 2021-06-26 DIAGNOSIS — M546 Pain in thoracic spine: Secondary | ICD-10-CM | POA: Diagnosis not present

## 2021-06-26 DIAGNOSIS — M9903 Segmental and somatic dysfunction of lumbar region: Secondary | ICD-10-CM | POA: Diagnosis not present

## 2021-06-26 DIAGNOSIS — M955 Acquired deformity of pelvis: Secondary | ICD-10-CM | POA: Diagnosis not present

## 2021-06-26 DIAGNOSIS — M9905 Segmental and somatic dysfunction of pelvic region: Secondary | ICD-10-CM | POA: Diagnosis not present

## 2021-06-26 DIAGNOSIS — M5442 Lumbago with sciatica, left side: Secondary | ICD-10-CM | POA: Diagnosis not present

## 2021-06-26 DIAGNOSIS — M9902 Segmental and somatic dysfunction of thoracic region: Secondary | ICD-10-CM | POA: Diagnosis not present

## 2021-06-27 ENCOUNTER — Other Ambulatory Visit: Payer: Self-pay | Admitting: Internal Medicine

## 2021-06-27 DIAGNOSIS — I10 Essential (primary) hypertension: Secondary | ICD-10-CM

## 2021-07-11 DIAGNOSIS — G4733 Obstructive sleep apnea (adult) (pediatric): Secondary | ICD-10-CM | POA: Diagnosis not present

## 2021-07-13 ENCOUNTER — Ambulatory Visit: Payer: Medicare HMO

## 2021-07-24 DIAGNOSIS — M955 Acquired deformity of pelvis: Secondary | ICD-10-CM | POA: Diagnosis not present

## 2021-07-24 DIAGNOSIS — M9905 Segmental and somatic dysfunction of pelvic region: Secondary | ICD-10-CM | POA: Diagnosis not present

## 2021-07-24 DIAGNOSIS — M5442 Lumbago with sciatica, left side: Secondary | ICD-10-CM | POA: Diagnosis not present

## 2021-07-24 DIAGNOSIS — M9902 Segmental and somatic dysfunction of thoracic region: Secondary | ICD-10-CM | POA: Diagnosis not present

## 2021-07-24 DIAGNOSIS — M9903 Segmental and somatic dysfunction of lumbar region: Secondary | ICD-10-CM | POA: Diagnosis not present

## 2021-07-24 DIAGNOSIS — M546 Pain in thoracic spine: Secondary | ICD-10-CM | POA: Diagnosis not present

## 2021-08-05 ENCOUNTER — Other Ambulatory Visit: Payer: Self-pay | Admitting: Internal Medicine

## 2021-08-07 NOTE — Progress Notes (Signed)
Patient ID: Richard Hardy, male   DOB: July 09, 1946, 75 y.o.   MRN: [id number] Cardiology Office Note  Date:  08/08/2021   ID:  Richard, Hardy [birth year], MRN [id number]  PCP:  Crecencio Mc, MD   Chief Complaint  Patient presents with   12 month follow up     "Doing well." Medications reviewed by the patient verbally.     HPI:  Richard Hardy is a 75 year-old male with a history of   hyperlipidemia,  glucose intolerance, obstructive sleep apnea on CPAP,  Borderline diabetes,   Asymptomatic bradycardia strong family history of coronary artery disease  who returns for routine followup of his hyperlipidemia. 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      previously worked  at [employer], long hours Active, playing lots of golf No new symptoms of shortness of breath or chest pain No near syncope or syncope Remains on CPAP  Labs reviewed Hemoglobin A1c 6.0 Total chol 106, LDL 48  EKG personally reviewed by myself on todays visit Sinus bradycardia rate 55 bpm  Other past medical history  seen by Dr. Grayland Ormond of hematology oncology for prior drop in his platelets. Aspirin was held through this.     PMH:   has a past medical history of Arthritis, Basal cell carcinoma (02/02/2020), Diabetes mellitus without complication ([place]) (5809), Dysrhythmia, Glucose intolerance (impaired glucose tolerance), Hyperlipidemia, Obesity, Sleep apnea, and Thrombocythemia.  PSH:    Past Surgical History:  Procedure Laterality Date   CATARACT EXTRACTION, BILATERAL     COLONOSCOPY WITH PROPOFOL N/A 06/23/2017   Procedure: COLONOSCOPY WITH PROPOFOL;  Surgeon: Manya Silvas, MD;  Location: [facility] ENDOSCOPY;  Service: Endoscopy;  Laterality: N/A;   COLONOSCOPY WITH PROPOFOL N/A 10/16/2020   Procedure: COLONOSCOPY WITH PROPOFOL;  Surgeon: Toledo, Benay Pike, MD;  Location: [facility] ENDOSCOPY;  Service: Gastroenterology;  Laterality: N/A;   EYE SURGERY     LITHOTRIPSY     Dr. Bernardo Heater   NASAL  SEPTUM SURGERY     TONSILLECTOMY      Current Outpatient Medications  Medication Sig Dispense Refill   aspirin EC 81 MG tablet Take 1 tablet (81 mg total) by mouth daily. 90 tablet 4   Blood Glucose Monitoring Suppl (ONETOUCH VERIO IQ SYSTEM) w/Device KIT Use to check blood sugars once daily. 1 kit 0   chlorhexidine (PERIDEX) 0.12 % solution SMARTSIG:0.5 Ounce(s) By Mouth Twice Daily     CINNAMON PO Take 2 capsules by mouth daily.     Coenzyme Q10 (COQ10) 200 MG CAPS Take 1 tablet by mouth daily.     Fish Oil OIL Take by mouth daily.     fluticasone (FLONASE) 50 MCG/ACT nasal spray Place into the nose at bedtime.     glipiZIDE (GLUCOTROL) 5 MG tablet TAKE 1 TABLET (5 MG TOTAL) BY MOUTH 2 (TWO) TIMES DAILY BEFORE A MEAL. 180 tablet 1   glucosamine-chondroitin 500-400 MG tablet Take 1 tablet by mouth in the morning and at bedtime.     MAGNESIUM CITRATE PO Take 1 capsule by mouth in the morning and at bedtime.     metFORMIN (GLUCOPHAGE-XR) 500 MG 24 hr tablet TAKE 1 TABLET BY MOUTH EVERY DAY WITH BREAKFAST 90 tablet 1   Multiple Vitamin (MULTIVITAMIN) capsule Take 1 capsule by mouth daily.     naproxen (NAPROSYN) 250 MG tablet PLEASE SEE ATTACHED FOR DETAILED DIRECTIONS     ONETOUCH DELICA LANCETS 98P MISC Use to check blood sugars once daily. 100  each 1   ONETOUCH VERIO test strip USE TO CHECK SUGARS ONCE OR TWICE DAILY . 100 strip 1   Turmeric 500 MG TABS Take 1 tablet by mouth daily.     vitamin C (ASCORBIC ACID) 500 MG tablet Take 500 mg by mouth daily.     amLODipine (NORVASC) 5 MG tablet Take 1 tablet (5 mg total) by mouth daily. 90 tablet 3   amoxicillin (AMOXIL) 500 MG capsule Take 500 mg by mouth 4 (four) times daily. (Patient not taking: Reported on 08/08/2021)     atorvastatin (LIPITOR) 20 MG tablet Take 1 tablet (20 mg total) by mouth daily. 90 tablet 3   furosemide (LASIX) 20 MG tablet Take 0.5 tablets (10 mg total) by mouth daily. 90 tablet 3   hydrochlorothiazide (HYDRODIURIL)  25 MG tablet Take 1 tablet (25 mg total) by mouth daily. 90 tablet 3   No current facility-administered medications for this visit.     Allergies:   Losartan, Lisinopril, Metformin and related, Sulfonamide derivatives, and Augmentin [amoxicillin-pot clavulanate]   Social History:  The patient  reports that he quit smoking about 57 years ago. His smoking use included cigarettes. He has a 2.00 pack-year smoking history. He has never used smokeless tobacco. He reports current alcohol use. He reports that he does not use drugs.   Family History:   family history includes Cancer in his mother; Crohn's disease in his brother; Heart attack (age of onset: 61) in his father; Heart disease in his brother, father, and paternal grandfather; Hyperlipidemia in his son; Hypertension in his son; Sleep apnea in his son; Thyroid disease in his son.    Review of Systems: Review of Systems  Constitutional: Negative.   HENT: Negative.    Respiratory: Negative.    Cardiovascular: Negative.   Gastrointestinal: Negative.   Musculoskeletal: Negative.   Neurological: Negative.   Psychiatric/Behavioral: Negative.    All other systems reviewed and are negative.  PHYSICAL EXAM: VS:  BP 120/68 (BP Location: Left Arm, Patient Position: Sitting, Cuff Size: Normal)   Pulse (!) 55   Ht _0  (1.778 m)   Wt 209 lb (94.8 kg)   SpO2 98%   BMI 29.99 kg/m  , BMI Body mass index is 29.99 kg/m. Constitutional:  oriented to person, place, and time. No distress.  HENT:  Head: Grossly normal Eyes:  no discharge. No scleral icterus.  Neck: No JVD, no carotid bruits  Cardiovascular: Regular rate and rhythm, no murmurs appreciated Pulmonary/Chest: Clear to auscultation bilaterally, no wheezes or rails Abdominal: Soft.  no distension.  no tenderness.  Musculoskeletal: Normal range of motion Neurological:  normal muscle tone. Coordination normal. No atrophy Skin: Skin warm and dry Psychiatric: normal affect,  pleasant   Recent Labs: 05/10/2021: ALT 29; BUN 21; Creatinine, Ser 1.03; Potassium 4.3; Sodium 142    Lipid Panel Lab Results  Component Value Date   CHOL 105 05/10/2021   HDL 45.20 05/10/2021   LDLCALC 48 05/10/2021   TRIG 56.0 05/10/2021      Wt Readings from Last 3 Encounters:  08/08/21 209 lb (94.8 kg)  05/10/21 202 lb 3.2 oz (91.7 kg)  02/05/21 200 lb 12.8 oz (91.1 kg)       ASSESSMENT AND PLAN:  Essential hypertension - Plan: EKG 12-Lead Blood pressure is well controlled on today's visit. No changes made to the medications.  Hyperlipemia aortic atherosclerosis on x-ray Cholesterol at goal  Controlled type 2 diabetes mellitus without complication, without long-term current use of  insulin (HCC)  Hemoglobin A1c improving Continue lifestyle modification, plays lots of golf  Bradycardia -  Asymptomatic bradycardia No further work-up  Aortic atherosclerosis Noted on x-ray Improved diabetes, cholesterol at goal,  non-smoker  Chronic low back pain Stable   Total encounter time more than 25 minutes  Greater than 50% was spent in counseling and coordination of care with the patient      Orders Placed This Encounter  Procedures   EKG 12-Lead     Signed, Esmond Plants, M.D., Ph.D. 08/08/2021  [place], [place]

## 2021-08-08 ENCOUNTER — Other Ambulatory Visit: Payer: Self-pay

## 2021-08-08 ENCOUNTER — Ambulatory Visit: Payer: Medicare HMO | Admitting: Cardiovascular Disease

## 2021-08-08 ENCOUNTER — Encounter: Payer: Self-pay | Admitting: Cardiovascular Disease

## 2021-08-08 VITALS — BP 120/68 | HR 55 | Ht 70.0 in | Wt 209.0 lb

## 2021-08-08 DIAGNOSIS — E1169 Type 2 diabetes mellitus with other specified complication: Secondary | ICD-10-CM | POA: Diagnosis not present

## 2021-08-08 DIAGNOSIS — I10 Essential (primary) hypertension: Secondary | ICD-10-CM

## 2021-08-08 DIAGNOSIS — E785 Hyperlipidemia, unspecified: Secondary | ICD-10-CM | POA: Diagnosis not present

## 2021-08-08 DIAGNOSIS — R001 Bradycardia, unspecified: Secondary | ICD-10-CM

## 2021-08-08 MED ORDER — ATORVASTATIN CALCIUM 20 MG PO TABS: 20.0000 mg | ORAL_TABLET | Freq: Every day | ORAL | 3 refills | Status: DC

## 2021-08-08 MED ORDER — AMLODIPINE BESYLATE 5 MG PO TABS: 5.0000 mg | ORAL_TABLET | Freq: Every day | ORAL | 3 refills | Status: DC

## 2021-08-08 MED ORDER — HYDROCHLOROTHIAZIDE 25 MG PO TABS: 25.0000 mg | ORAL_TABLET | Freq: Every day | ORAL | 3 refills | Status: DC

## 2021-08-08 MED ORDER — FUROSEMIDE 20 MG PO TABS: 10.0000 mg | ORAL_TABLET | Freq: Every day | ORAL | 3 refills | Status: DC

## 2021-08-08 NOTE — Patient Instructions (Addendum)
Medication Instructions:  On hot or busy days, Hold the HCTZ and the furosemide  If you need a refill on your cardiac medications before your next appointment, please call your pharmacy.   Lab work: No new labs needed  Testing/Procedures: No new testing needed   Follow-Up: At [facility], you and your health needs are our priority.  As part of our continuing mission to provide you with exceptional heart care, we have created designated Provider Care Teams.  These Care Teams include your primary Cardiologist (physician) and Advanced Practice Providers (APPs -  Physician Assistants and Nurse Practitioners) who all work together to provide you with the care you need, when you need it.  You will need a follow up appointment in 12 months  Providers on your designated Care Team:   Murray Hodgkins, NP Christell Faith, PA-C Marrianne Mood, PA-C [facility] [place], [place]  COVID-19 Vaccine Information can be found at: ShippingScam.co.uk For questions related to vaccine distribution or appointments, please email [email or web address] or call [phone].

## 2021-08-15 ENCOUNTER — Ambulatory Visit: Payer: Medicare HMO

## 2021-08-16 ENCOUNTER — Other Ambulatory Visit: Payer: Self-pay

## 2021-08-16 ENCOUNTER — Ambulatory Visit: Payer: Medicare HMO | Admitting: Dermatology

## 2021-08-16 DIAGNOSIS — L578 Other skin changes due to chronic exposure to nonionizing radiation: Secondary | ICD-10-CM

## 2021-08-16 DIAGNOSIS — L570 Actinic keratosis: Secondary | ICD-10-CM | POA: Diagnosis not present

## 2021-08-16 DIAGNOSIS — Z85828 Personal history of other malignant neoplasm of skin: Secondary | ICD-10-CM | POA: Diagnosis not present

## 2021-08-16 DIAGNOSIS — S81812A Laceration without foreign body, left lower leg, initial encounter: Secondary | ICD-10-CM

## 2021-08-16 MED ORDER — MUPIROCIN 2 % EX OINT: 1.0000 "application " | TOPICAL_OINTMENT | Freq: Two times a day (BID) | CUTANEOUS | 2 refills | Status: DC

## 2021-08-16 NOTE — Patient Instructions (Addendum)
Recommend Puracyn to clean areas of lower legs and Mupirocin twice daily until healed    Cryotherapy Aftercare  Wash gently with soap and water everyday.   Apply Vaseline and Band-Aid daily until healed.    If you have any questions or concerns for your doctor, please call our main line at [phone] and press option 4 to reach your doctor's medical assistant. If no one answers, please leave a voicemail as directed and we will return your call as soon as possible. Messages left after 4 pm will be answered the following business day.   You may also send [place] a message via [place]. We typically respond to MyChart messages within 1-2 business days.  For prescription refills, please ask your pharmacy to contact our office. Our fax number is [phone].  If you have an urgent issue when the clinic is closed that cannot wait until the next business day, you can page your doctor at the number below.    Please note that while we do our best to be available for urgent issues outside of office hours, we are not available 24/7.   If you have an urgent issue and are unable to reach [place], you may choose to seek medical care at your doctor's office, retail clinic, urgent care center, or emergency room.  If you have a medical emergency, please immediately call 911 or go to the emergency department.  Pager Numbers  - Dr. Nehemiah Massed: [phone]  - Dr. Laurence Ferrari: [phone]  - Dr. Nicole Kindred: [phone]  In the event of inclement weather, please call our main line at [phone] for an update on the status of any delays or closures.  Dermatology Medication Tips: Please keep the boxes that topical medications come in in order to help keep track of the instructions about where and how to use these. Pharmacies typically print the medication instructions only on the boxes and not directly on the medication tubes.   If your medication is too expensive, please contact our office at [phone] option 4 or  send [place] a message through [place].   We are unable to tell what your co-pay for medications will be in advance as this is different depending on your insurance coverage. However, we may be able to find a substitute medication at lower cost or fill out paperwork to get insurance to cover a needed medication.   If a prior authorization is required to get your medication covered by your insurance company, please allow [place] 1-2 business days to complete this process.  Drug prices often vary depending on where the prescription is filled and some pharmacies may offer cheaper prices.  The website www.goodrx.com contains coupons for medications through different pharmacies. The prices here do not account for what the cost may be with help from insurance (it may be cheaper with your insurance), but the website can give you the price if you did not use any insurance.  - You can print the associated coupon and take it with your prescription to the pharmacy.  - You may also stop by our office during regular business hours and pick up a GoodRx coupon card.  - If you need your prescription sent electronically to a different pharmacy, notify our office through [facility] or by phone at [phone] option 4.

## 2021-08-16 NOTE — Progress Notes (Signed)
Follow-Up Visit   Subjective  Richard Hardy is a 75 y.o. male who presents for the following: Actinic Keratosis (6 month follow up of face, ears, hands treated with LN2).  The following portions of the chart were reviewed this encounter and updated as appropriate:   Tobacco  Allergies  Meds  Problems  Med Hx  Surg Hx  Fam Hx     Review of Systems:  No other skin or systemic complaints except as noted in HPI or Assessment and Plan.  Objective  Well appearing patient in no apparent distress; mood and affect are within normal limits.  A focused examination was performed including scalp, face, arms, hands. Relevant physical exam findings are noted in the Assessment and Plan.  Face, ears, arms (17) Erythematous thin papules/macules with gritty scale.   Left Lower Leg - Anterior Laceration   Assessment & Plan   History of Basal Cell Carcinoma of the Skin - No evidence of recurrence today - Recommend regular full body skin exams - Recommend daily broad spectrum sunscreen SPF 30+ to sun-exposed areas, reapply every 2 hours as needed.  - Call if any new or changing lesions are noted between office visits  AK (actinic keratosis) (17) Face, ears, arms  Actinic Damage - Severe, confluent actinic changes with pre-cancerous actinic keratoses  - Severe, chronic, not at goal, secondary to cumulative UV radiation exposure over time - diffuse scaly erythematous macules and papules with underlying dyspigmentation - Discussed Prescription "Field Treatment" for Severe, Chronic Confluent Actinic Changes with Pre-Cancerous Actinic Keratoses Field treatment involves treatment of an entire area of skin that has confluent Actinic Changes (Sun/ Ultraviolet light damage) and PreCancerous Actinic Keratoses by method of PhotoDynamic Therapy (PDT) and/or prescription Topical Chemotherapy agents such as 5-fluorouracil, 5-fluorouracil/calcipotriene, and/or imiquimod.  The purpose is to decrease the  number of clinically evident and subclinical PreCancerous lesions to prevent progression to development of skin cancer by chemically destroying early precancer changes that may or may not be visible.  It has been shown to reduce the risk of developing skin cancer in the treated area. As a result of treatment, redness, scaling, crusting, and open sores may occur during treatment course. One or more than one of these methods may be used and may have to be used several times to control, suppress and eliminate the PreCancerous changes. Discussed treatment course, expected reaction, and possible side effects. - Recommend daily broad spectrum sunscreen SPF 30+ to sun-exposed areas, reapply every 2 hours as needed.  - Staying in the shade or wearing long sleeves, sun glasses (UVA+UVB protection) and wide brim hats (4-inch brim around the entire circumference of the hat) are also recommended. - Call for new or changing lesions.  May consider field treatment on follow up  Destruction of lesion - Face, ears, arms Complexity: simple   Destruction method: cryotherapy   Informed consent: discussed and consent obtained   Timeout:  patient name, date of birth, surgical site, and procedure verified Lesion destroyed using liquid nitrogen: Yes   Region frozen until ice ball extended beyond lesion: Yes   Outcome: patient tolerated procedure well with no complications   Post-procedure details: wound care instructions given    Laceration of left lower leg, initial encounter Left Lower Leg - Anterior  Recommend Puracyn to clean areas of lower legs and Mupirocin twice daily until healed  mupirocin ointment (BACTROBAN) 2 % - Left Lower Leg - Anterior Apply 1 application topically 2 (two) times daily.  Return in about  6 months (around 02/13/2022) for TBSE.  I, Ashok Cordia, CMA, am acting as scribe for Sarina Ser, MD . Documentation: I have reviewed the above documentation for accuracy and completeness, and I  agree with the above.  Sarina Ser, MD

## 2021-08-21 ENCOUNTER — Encounter: Payer: Self-pay | Admitting: Dermatology

## 2021-08-21 DIAGNOSIS — M9902 Segmental and somatic dysfunction of thoracic region: Secondary | ICD-10-CM | POA: Diagnosis not present

## 2021-08-21 DIAGNOSIS — M955 Acquired deformity of pelvis: Secondary | ICD-10-CM | POA: Diagnosis not present

## 2021-08-21 DIAGNOSIS — M546 Pain in thoracic spine: Secondary | ICD-10-CM | POA: Diagnosis not present

## 2021-08-21 DIAGNOSIS — M5442 Lumbago with sciatica, left side: Secondary | ICD-10-CM | POA: Diagnosis not present

## 2021-08-21 DIAGNOSIS — M9905 Segmental and somatic dysfunction of pelvic region: Secondary | ICD-10-CM | POA: Diagnosis not present

## 2021-08-21 DIAGNOSIS — M9903 Segmental and somatic dysfunction of lumbar region: Secondary | ICD-10-CM | POA: Diagnosis not present

## 2021-09-25 DIAGNOSIS — M955 Acquired deformity of pelvis: Secondary | ICD-10-CM | POA: Diagnosis not present

## 2021-09-25 DIAGNOSIS — M546 Pain in thoracic spine: Secondary | ICD-10-CM | POA: Diagnosis not present

## 2021-09-25 DIAGNOSIS — M9902 Segmental and somatic dysfunction of thoracic region: Secondary | ICD-10-CM | POA: Diagnosis not present

## 2021-09-25 DIAGNOSIS — M9903 Segmental and somatic dysfunction of lumbar region: Secondary | ICD-10-CM | POA: Diagnosis not present

## 2021-09-25 DIAGNOSIS — M5442 Lumbago with sciatica, left side: Secondary | ICD-10-CM | POA: Diagnosis not present

## 2021-09-25 DIAGNOSIS — M9905 Segmental and somatic dysfunction of pelvic region: Secondary | ICD-10-CM | POA: Diagnosis not present

## 2021-10-11 ENCOUNTER — Ambulatory Visit: Payer: Medicare HMO

## 2021-10-24 DIAGNOSIS — M5442 Lumbago with sciatica, left side: Secondary | ICD-10-CM | POA: Diagnosis not present

## 2021-10-24 DIAGNOSIS — M9903 Segmental and somatic dysfunction of lumbar region: Secondary | ICD-10-CM | POA: Diagnosis not present

## 2021-10-24 DIAGNOSIS — M546 Pain in thoracic spine: Secondary | ICD-10-CM | POA: Diagnosis not present

## 2021-10-24 DIAGNOSIS — M9902 Segmental and somatic dysfunction of thoracic region: Secondary | ICD-10-CM | POA: Diagnosis not present

## 2021-10-24 DIAGNOSIS — M955 Acquired deformity of pelvis: Secondary | ICD-10-CM | POA: Diagnosis not present

## 2021-10-24 DIAGNOSIS — M9905 Segmental and somatic dysfunction of pelvic region: Secondary | ICD-10-CM | POA: Diagnosis not present

## 2021-11-07 ENCOUNTER — Ambulatory Visit (INDEPENDENT_AMBULATORY_CARE_PROVIDER_SITE_OTHER): Payer: Medicare HMO

## 2021-11-07 VITALS — Ht 70.0 in | Wt 209.0 lb

## 2021-11-07 DIAGNOSIS — Z0000 Encounter for general adult medical examination without abnormal findings: Secondary | ICD-10-CM

## 2021-11-07 NOTE — Patient Instructions (Addendum)
Richard Hardy , Thank you for taking time to come for your Medicare Wellness Visit. I appreciate your ongoing commitment to your health goals. Please review the following plan we discussed and let me know if I can assist you in the future.   These are the goals we discussed:  Goals       Patient Stated     Weight (lb) < 200 lb (90.7 kg) (pt-stated)      Lose about 10 lbs Lower A1C          This is a list of the screening recommended for you and due dates:  Health Maintenance  Topic Date Due   Zoster (Shingles) Vaccine (1 of 2) 02/05/2022*   COVID-19 Vaccine (3 - Pfizer risk series) 02/06/2022*   Hemoglobin A1C  11/09/2021   Eye exam for diabetics  01/17/2022   Complete foot exam   05/10/2022   Urine Protein Check  05/10/2022   Tetanus Vaccine  06/21/2023   Pneumonia Vaccine  Completed   Flu Shot  Completed   Hepatitis C Screening: USPSTF Recommendation to screen - Ages 18-79 yo.  Completed   HPV Vaccine  Aged Out   Colon Cancer Screening  Discontinued  *Topic was postponed. The date shown is not the original due date.   Advanced directives: on file  Conditions/risks identified: none new  Follow up in one year for your annual wellness visit.   Preventive Care 8 Years and Older, Male Preventive care refers to lifestyle choices and visits with your health care provider that can promote health and wellness. What does preventive care include? A yearly physical exam. This is also called an annual well check. Dental exams once or twice a year. Routine eye exams. Ask your health care provider how often you should have your eyes checked. Personal lifestyle choices, including: Daily care of your teeth and gums. Regular physical activity. Eating a healthy diet. Avoiding tobacco and drug use. Limiting alcohol use. Practicing safe sex. Taking low doses of aspirin every day. Taking vitamin and mineral supplements as recommended by your health care provider. What happens during  an annual well check? The services and screenings done by your health care provider during your annual well check will depend on your age, overall health, lifestyle risk factors, and family history of disease. Counseling  Your health care provider may ask you questions about your: Alcohol use. Tobacco use. Drug use. Emotional well-being. Home and relationship well-being. Sexual activity. Eating habits. History of falls. Memory and ability to understand (cognition). Work and work Statistician. Screening  You may have the following tests or measurements: Height, weight, and BMI. Blood pressure. Lipid and cholesterol levels. These may be checked every 5 years, or more frequently if you are over 19 years old. Skin check. Lung cancer screening. You may have this screening every year starting at age 85 if you have a 30-pack-year history of smoking and currently smoke or have quit within the past 15 years. Fecal occult blood test (FOBT) of the stool. You may have this test every year starting at age 59. Flexible sigmoidoscopy or colonoscopy. You may have a sigmoidoscopy every 5 years or a colonoscopy every 10 years starting at age 68. Prostate cancer screening. Recommendations will vary depending on your family history and other risks. Hepatitis C blood test. Hepatitis B blood test. Sexually transmitted disease (STD) testing. Diabetes screening. This is done by checking your blood sugar (glucose) after you have not eaten for a while (fasting). You may  have this done every 1-3 years. Abdominal aortic aneurysm (AAA) screening. You may need this if you are a current or former smoker. Osteoporosis. You may be screened starting at age 73 if you are at high risk. Talk with your health care provider about your test results, treatment options, and if necessary, the need for more tests. Vaccines  Your health care provider may recommend certain vaccines, such as: Influenza vaccine. This is recommended  every year. Tetanus, diphtheria, and acellular pertussis (Tdap, Td) vaccine. You may need a Td booster every 10 years. Zoster vaccine. You may need this after age 39. Pneumococcal 13-valent conjugate (PCV13) vaccine. One dose is recommended after age 77. Pneumococcal polysaccharide (PPSV23) vaccine. One dose is recommended after age 71. Talk to your health care provider about which screenings and vaccines you need and how often you need them. This information is not intended to replace advice given to you by your health care provider. Make sure you discuss any questions you have with your health care provider. Document Released: 12/22/2015 Document Revised: 08/14/2016 Document Reviewed: 09/26/2015 Elsevier Interactive Patient Education  2017 [place] Prevention in the Home Falls can cause injuries. They can happen to people of all ages. There are many things you can do to make your home safe and to help prevent falls. What can I do on the outside of my home? Regularly fix the edges of walkways and driveways and fix any cracks. Remove anything that might make you trip as you walk through a door, such as a raised step or threshold. Trim any bushes or trees on the path to your home. Use bright outdoor lighting. Clear any walking paths of anything that might make someone trip, such as rocks or tools. Regularly check to see if handrails are loose or broken. Make sure that both sides of any steps have handrails. Any raised decks and porches should have guardrails on the edges. Have any leaves, snow, or ice cleared regularly. Use sand or salt on walking paths during winter. Clean up any spills in your garage right away. This includes oil or grease spills. What can I do in the bathroom? Use night lights. Install grab bars by the toilet and in the tub and shower. Do not use towel bars as grab bars. Use non-skid mats or decals in the tub or shower. If you need to sit down in the shower,  use a plastic, non-slip stool. Keep the floor dry. Clean up any water that spills on the floor as soon as it happens. Remove soap buildup in the tub or shower regularly. Attach bath mats securely with double-sided non-slip rug tape. Do not have throw rugs and other things on the floor that can make you trip. What can I do in the bedroom? Use night lights. Make sure that you have a light by your bed that is easy to reach. Do not use any sheets or blankets that are too big for your bed. They should not hang down onto the floor. Have a firm chair that has side arms. You can use this for support while you get dressed. Do not have throw rugs and other things on the floor that can make you trip. What can I do in the kitchen? Clean up any spills right away. Avoid walking on wet floors. Keep items that you use a lot in easy-to-reach places. If you need to reach something above you, use a strong step stool that has a grab bar. Keep electrical cords  out of the way. Do not use floor polish or wax that makes floors slippery. If you must use wax, use non-skid floor wax. Do not have throw rugs and other things on the floor that can make you trip. What can I do with my stairs? Do not leave any items on the stairs. Make sure that there are handrails on both sides of the stairs and use them. Fix handrails that are broken or loose. Make sure that handrails are as long as the stairways. Check any carpeting to make sure that it is firmly attached to the stairs. Fix any carpet that is loose or worn. Avoid having throw rugs at the top or bottom of the stairs. If you do have throw rugs, attach them to the floor with carpet tape. Make sure that you have a light switch at the top of the stairs and the bottom of the stairs. If you do not have them, ask someone to add them for you. What else can I do to help prevent falls? Wear shoes that: Do not have high heels. Have rubber bottoms. Are comfortable and fit you  well. Are closed at the toe. Do not wear sandals. If you use a stepladder: Make sure that it is fully opened. Do not climb a closed stepladder. Make sure that both sides of the stepladder are locked into place. Ask someone to hold it for you, if possible. Clearly mark and make sure that you can see: Any grab bars or handrails. First and last steps. Where the edge of each step is. Use tools that help you move around (mobility aids) if they are needed. These include: Canes. Walkers. Scooters. Crutches. Turn on the lights when you go into a dark area. Replace any light bulbs as soon as they burn out. Set up your furniture so you have a clear path. Avoid moving your furniture around. If any of your floors are uneven, fix them. If there are any pets around you, be aware of where they are. Review your medicines with your doctor. Some medicines can make you feel dizzy. This can increase your chance of falling. Ask your doctor what other things that you can do to help prevent falls. This information is not intended to replace advice given to you by your health care provider. Make sure you discuss any questions you have with your health care provider. Document Released: 09/21/2009 Document Revised: 05/02/2016 Document Reviewed: 12/30/2014 Elsevier Interactive Patient Education  2017 [employer].

## 2021-11-07 NOTE — Progress Notes (Signed)
Subjective:   Richard Hardy is a 75 y.o. male who presents for Medicare Annual/Subsequent preventive examination.  Review of Systems    No ROS.  Medicare Wellness Virtual Visit.  Visual/audio telehealth visit, UTA vital signs.   See social history for additional risk factors.   Cardiac Risk Factors include: advanced age (>33mn, >>83women)     Objective:    Today's Vitals   11/07/21 0829  Weight: 209 lb (94.8 kg)  Height: 5' 10"  (1.778 m)   Body mass index is 29.99 kg/m.  Advanced Directives 11/07/2021 10/16/2020 07/12/2020 07/12/2019 04/29/2018 06/23/2017 04/28/2017  Does Patient Have a Medical Advance Directive? Yes Yes Yes Yes Yes Yes Yes  Type of [place] Out of facility DNR (pink MOST or yellow form) H[place]Living will [place] MillsLiving will [place]Living will - [place] will  Does patient want to make changes to medical advance directive? No - Patient declined - No - Patient declined No - Patient declined No - Patient declined - No - Patient declined  Copy of [place] Chart? Yes - validated most recent copy scanned in chart (See row information) - Yes - validated most recent copy scanned in chart (See row information) Yes - validated most recent copy scanned in chart (See row information) Yes - Yes    Current Medications (verified) Outpatient Encounter Medications as of 11/07/2021  Medication Sig   amLODipine (NORVASC) 5 MG tablet Take 1 tablet (5 mg total) by mouth daily.   amoxicillin (AMOXIL) 500 MG capsule Take 500 mg by mouth 4 (four) times daily. (Patient not taking: Reported on 08/08/2021)   aspirin EC 81 MG tablet Take 1 tablet (81 mg total) by mouth daily.   atorvastatin (LIPITOR) 20 MG tablet Take 1 tablet (20 mg total) by mouth daily.   Blood Glucose Monitoring Suppl (ONETOUCH VERIO IQ SYSTEM) w/Device KIT Use  to check blood sugars once daily.   chlorhexidine (PERIDEX) 0.12 % solution SMARTSIG:0.5 Ounce(s) By Mouth Twice Daily   CINNAMON PO Take 2 capsules by mouth daily.   Coenzyme Q10 (COQ10) 200 MG CAPS Take 1 tablet by mouth daily.   Fish Oil OIL Take by mouth daily.   fluticasone (FLONASE) 50 MCG/ACT nasal spray Place into the nose at bedtime.   furosemide (LASIX) 20 MG tablet Take 0.5 tablets (10 mg total) by mouth daily.   glipiZIDE (GLUCOTROL) 5 MG tablet TAKE 1 TABLET (5 MG TOTAL) BY MOUTH 2 (TWO) TIMES DAILY BEFORE A MEAL.   glucosamine-chondroitin 500-400 MG tablet Take 1 tablet by mouth in the morning and at bedtime.   hydrochlorothiazide (HYDRODIURIL) 25 MG tablet Take 1 tablet (25 mg total) by mouth daily.   MAGNESIUM CITRATE PO Take 1 capsule by mouth in the morning and at bedtime.   metFORMIN (GLUCOPHAGE-XR) 500 MG 24 hr tablet TAKE 1 TABLET BY MOUTH EVERY DAY WITH BREAKFAST   Multiple Vitamin (MULTIVITAMIN) capsule Take 1 capsule by mouth daily.   mupirocin ointment (BACTROBAN) 2 % Apply 1 application topically 2 (two) times daily.   naproxen (NAPROSYN) 250 MG tablet PLEASE SEE ATTACHED FOR DETAILED DIRECTIONS   ONETOUCH DELICA LANCETS 326VMISC Use to check blood sugars once daily.   ONETOUCH VERIO test strip USE TO CHECK SUGARS ONCE OR TWICE DAILY .   Turmeric 500 MG TABS Take 1 tablet by mouth daily.   vitamin C (ASCORBIC ACID) 500 MG  tablet Take 500 mg by mouth daily.   No facility-administered encounter medications on file as of 11/07/2021.    Allergies (verified) Losartan, Lisinopril, Metformin and related, Sulfonamide derivatives, and Augmentin [amoxicillin-pot clavulanate]   History: Past Medical History:  Diagnosis Date   Arthritis    Basal cell carcinoma 02/02/2020   L prox med calf - ED&C    Diabetes mellitus without complication ([place]) 4081   Dysrhythmia    Glucose intolerance (impaired glucose tolerance)    Hyperlipidemia    Obesity    Sleep apnea    On  BiPAP, Dr. Richardson Landry   Thrombocythemia    Dr. Grayland Ormond   Past Surgical History:  Procedure Laterality Date   CATARACT EXTRACTION, BILATERAL     COLONOSCOPY WITH PROPOFOL N/A 06/23/2017   Procedure: COLONOSCOPY WITH PROPOFOL;  Surgeon: Manya Silvas, MD;  Location[facility] ENDOSCOPY;  Service: Endoscopy;  Laterality: N/A;   COLONOSCOPY WITH PROPOFOL N/A 10/16/2020   Procedure: COLONOSCOPY WITH PROPOFOL;  Surgeon: Toledo, Benay Pike, MD;  Location: [facility] ENDOSCOPY;  Service: Gastroenterology;  Laterality: N/A;   EYE SURGERY     LITHOTRIPSY     Dr. Bernardo Heater   NASAL SEPTUM SURGERY     TONSILLECTOMY     Family History  Problem Relation Age of Onset   Cancer Mother        brain   Heart attack Father 54   Heart disease Father    Heart disease Brother        s/p CABG   Crohn's disease Brother    Hyperlipidemia Son    Hypertension Son    Sleep apnea Son    Thyroid disease Son    Heart disease Paternal Grandfather    Social History   Socioeconomic History   Marital status: Married    Spouse name: Not on file   Number of children: Not on file   Years of education: Not on file   Highest education level: Not on file  Occupational History   Not on file  Tobacco Use   Smoking status: Former    Packs/day: 0.50    Years: 4.00    Pack years: 2.00    Types: Cigarettes    Quit date: 12/10/1963    Years since quitting: 57.9   Smokeless tobacco: Never  Vaping Use   Vaping Use: Never used  Substance and Sexual Activity   Alcohol use: Yes    Comment: Occasional-approx 4 beers per month   Drug use: No   Sexual activity: Not on file  Other Topics Concern   Not on file  Social History Narrative   Lives in [place] with wife. 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      Diet - regular, healthy      Exercise - walks with work and goes to the [place] Strain: Low Risk    Difficulty of Paying  Living Expenses: Not hard at [employer] Insecurity: No Food Insecurity   Worried About Charity fundraiser in the Last Year: Never true   Arboriculturist in the Last Year: Never true  Transportation Needs: No Transportation Needs   Lack of Transportation (Medical): No   Lack of Transportation (Non-Medical): No  Physical Activity: Sufficiently Active   Days of Exercise per Week: 5 days   Minutes of Exercise per Session: 30 min  Stress: No Stress Concern  Present   Feeling of Stress : Not at all  Social Connections: Unknown   Frequency of Communication with Friends and Family: More than three times a week   Frequency of Social Gatherings with Friends and Family: Not on file   Attends Religious Services: Not on file   Active Member of Clubs or Organizations: Yes   Attends Archivist Meetings: More than 4 times per year   Marital Status: Married    Tobacco Counseling Counseling given: Not Answered   Clinical Intake:  Pre-visit preparation completed: Yes       Nutrition Risk Assessment: Has the patient had any N/V/D within the last 2 weeks?  No  Does the patient have any non-healing wounds?  No   Diabetes: How often do you monitor your CBG's? A couple times a week.   Financial Strains and Diabetes Management: Are you having any financial strains with the device, your supplies or your medication? No .  Does the patient want to be seen by Chronic Care Management for management of their diabetes?  No  Would the patient like to be referred to a Nutritionist or for Diabetic Management?  No    Diabetes: Yes (Followed by pcp)  How often do you need to have someone help you when you read instructions, pamphlets, or other written materials from your doctor or pharmacy?: 1 - Never    Interpreter Needed?: No      Activities of Daily Living In your present state of health, do you have any difficulty performing the following activities: 11/07/2021  Hearing? N  Vision?  N  Difficulty concentrating or making decisions? N  Walking or climbing stairs? N  Dressing or bathing? N  Doing errands, shopping? N  Preparing Food and eating ? N  Using the Toilet? N  In the past six months, have you accidently leaked urine? N  Do you have problems with loss of bowel control? N  Managing your Medications? N  Managing your Finances? N  Housekeeping or managing your Housekeeping? N  Some recent data might be hidden    Patient Care Team: Crecencio Mc, MD as PCP - General (Internal Medicine) Minna Merritts, MD as PCP - Cardiology (Cardiology) Jackolyn Confer, MD (Internal Medicine) Minna Merritts, MD as Consulting Physician (Cardiology)  Indicate any recent Medical Services you may have received from other than [facility] providers in the past year (date may be approximate).     Assessment:   This is a routine wellness examination for [facility].  Virtual Visit via Telephone Note  I connected with  GRYPHON VANDERVEEN on 11/07/21 at  8:15 AM EST by telephone and verified that I am speaking with the correct person using two identifiers.  Location: Patient: home Provider: office Persons participating in the virtual visit: patient/Nurse Health Advisor   I discussed the limitations, risks, security and privacy concerns of performing an evaluation and management service by telephone and the availability of in person appointments. The patient expressed understanding and agreed to proceed.  Interactive audio and video telecommunications were attempted between this nurse and patient, however failed, due to patient having technical difficulties OR patient did not have access to video capability.  We continued and completed visit with audio only.  Some vital signs may be absent or patient reported.   Hearing/Vision screen Hearing Screening - Comments:: Patient is able to hear conversational tones without difficulty.  No issues reported. Vision Screening - Comments::  Wears reading glasses  Cataract  extraction, bilateral They have seen their ophthalmologist in the last 12 months.    Dietary issues and exercise activities discussed: Current Exercise Habits: Home exercise routine, Type of exercise: walking, Time (Minutes): 60, Frequency (Times/Week): 5, Weekly Exercise (Minutes/Week): 300, Intensity: Mild Healthy diet Good water intake   Goals Addressed               This Visit's Progress     Patient Stated     Weight (lb) < 200 lb (90.7 kg) (pt-stated)   209 lb (94.8 kg)     Lose about 10 lbs Lower A1C         Depression Screen PHQ 2/9 Scores 11/07/2021 05/10/2021 07/12/2020 07/12/2019 04/29/2018 04/28/2017 05/20/2016  PHQ - 2 Score 0 0 0 0 0 0 0  PHQ- 9 Score - 0 - - - 0 -    Fall Risk Fall Risk  11/07/2021 05/10/2021 02/05/2021 11/20/2020 08/21/2020  Falls in the past year? 0 0 0 0 0  Number falls in past yr: 0 - 0 - -  Injury with Fall? 0 - 0 - -  Follow up Falls evaluation completed Falls evaluation completed Falls evaluation completed Falls evaluation completed Falls evaluation completed    [place]: Home free of loose throw rugs in walkways, pet beds, electrical cords, etc? Yes  Adequate lighting in your home to reduce risk of falls? Yes   ASSISTIVE DEVICES UTILIZED TO PREVENT FALLS: Use of a cane, walker or w/c? No   TIMED UP AND GO: Was the test performed? No .   Cognitive Function: Patient is alert and oriented x3.  MMSE - Mini Mental State Exam 04/29/2018 04/28/2017  Orientation to time 5 5  Orientation to Place 5 5  Registration 3 3  Attention/ Calculation 5 5  Recall 3 3  Language- name 2 objects 2 2  Language- repeat 1 1  Language- follow 3 step command 3 3  Language- read & follow direction 1 1  Write a sentence 1 1  Copy design 1 1  Total score 30 30     6CIT Screen 07/12/2020 07/12/2019  What Year? 0 points 0 points  What month? 0 points 0 points  What time? - 0 points  Count  back from 20 - 0 points  Months in reverse 0 points 0 points  Repeat phrase - 0 points  Total Score - 0   Immunizations Immunization History  Administered Date(s) Administered   Fluad Quad(high Dose 65+) 11/20/2020   Influenza, High Dose Seasonal PF 11/20/2016, 09/29/2017, 09/30/2018, 10/07/2019, 10/05/2021   Influenza,inj,Quad PF,6+ Mos 08/26/2014, 08/29/2015   Influenza-Unspecified 09/20/2012, 09/24/2013   PFIZER(Purple Top)SARS-COV-2 Vaccination 01/03/2020, 01/24/2020   Pneumococcal Conjugate-13 05/25/2014   Pneumococcal Polysaccharide-23 07/21/2012, 09/30/2018   Tdap 06/20/2013   Zoster, Live 06/20/2013   Shingrix Completed?: No.    Education has been provided regarding the importance of this vaccine. Patient has been advised to call insurance company to determine out of pocket expense if they have not yet received this vaccine. Advised may also receive vaccine at local pharmacy or Health Dept. Verbalized acceptance and understanding.  Screening Tests Health Maintenance  Topic Date Due   Zoster Vaccines- Shingrix (1 of 2) 02/05/2022 (Originally 01/24/1965)   COVID-19 Vaccine (3 - Pfizer risk series) 02/06/2022 (Originally 02/21/2020)   HEMOGLOBIN A1C  11/09/2021   OPHTHALMOLOGY EXAM  01/17/2022   FOOT EXAM  05/10/2022   URINE MICROALBUMIN  05/10/2022   TETANUS/TDAP  06/21/2023  Pneumonia Vaccine 104+ Years old  Completed   INFLUENZA VACCINE  Completed   Hepatitis C Screening  Completed   HPV VACCINES  Aged Out   COLONOSCOPY (Pts 45-33yr Insurance coverage will need to be confirmed)  Discontinued   Health Maintenance There are no preventive care reminders to display for this patient.  Lung Cancer Screening: (Low Dose CT Chest recommended if Age 77-80years, 30 pack-year currently smoking OR have quit w/in 15years.) does not qualify.   Vision Screening: Recommended annual ophthalmology exams for early detection of glaucoma and other disorders of the eye.  Dental Screening:  Recommended annual dental exams for proper oral hygiene  Community Resource Referral / Chronic Care Management: CRR required this visit?  No   CCM required this visit?  No      Plan:   Keep all routine maintenance appointments.   I have personally reviewed and noted the following in the patient's chart:   Medical and social history Use of alcohol, tobacco or illicit drugs  Current medications and supplements including opioid prescriptions. Patient is not currently taking opioid prescriptions. Functional ability and status Nutritional status Physical activity Advanced directives List of other physicians Hospitalizations, surgeries, and ER visits in previous 12 months Vitals Screenings to include cognitive, depression, and falls Referrals and appointments  In addition, I have reviewed and discussed with patient certain preventive protocols, quality metrics, and best practice recommendations. A written personalized care plan for preventive services as well as general preventive health recommendations were provided to patient.     OVarney Biles LPN   [id number]

## 2021-11-09 ENCOUNTER — Ambulatory Visit (INDEPENDENT_AMBULATORY_CARE_PROVIDER_SITE_OTHER): Payer: Medicare HMO | Admitting: Internal Medicine

## 2021-11-09 ENCOUNTER — Other Ambulatory Visit: Payer: Self-pay

## 2021-11-09 ENCOUNTER — Encounter: Payer: Self-pay | Admitting: Internal Medicine

## 2021-11-09 VITALS — BP 134/66 | HR 46 | Temp 96.0°F | Ht 70.0 in | Wt 207.4 lb

## 2021-11-09 DIAGNOSIS — E1169 Type 2 diabetes mellitus with other specified complication: Secondary | ICD-10-CM

## 2021-11-09 DIAGNOSIS — E785 Hyperlipidemia, unspecified: Secondary | ICD-10-CM | POA: Diagnosis not present

## 2021-11-09 DIAGNOSIS — I10 Essential (primary) hypertension: Secondary | ICD-10-CM | POA: Diagnosis not present

## 2021-11-09 LAB — COMPREHENSIVE METABOLIC PANEL
ALT: 36 U/L (ref 0–53)
AST: 25 U/L (ref 0–37)
Albumin: 4.4 g/dL (ref 3.5–5.2)
Alkaline Phosphatase: 57 U/L (ref 39–117)
BUN: 24 mg/dL — ABNORMAL HIGH (ref 6–23)
CO2: 29 mEq/L (ref 19–32)
Calcium: 9.6 mg/dL (ref 8.4–10.5)
Chloride: 104 mEq/L (ref 96–112)
Creatinine, Ser: 0.96 mg/dL (ref 0.40–1.50)
GFR: 76.09 mL/min (ref >60.00)
Glucose, Bld: 136 mg/dL — ABNORMAL HIGH (ref 70–99)
Potassium: 4.6 mEq/L (ref 3.5–5.1)
Sodium: 140 mEq/L (ref 135–145)
Total Bilirubin: 0.9 mg/dL (ref 0.2–1.2)
Total Protein: 6.8 g/dL (ref 6.0–8.3)

## 2021-11-09 LAB — HEMOGLOBIN A1C: Hgb A1c MFr Bld: 6.6 % — ABNORMAL HIGH (ref 4.6–6.5)

## 2021-11-09 LAB — LIPID PANEL
Cholesterol: 121 mg/dL (ref 0–200)
HDL: 45.6 mg/dL (ref >39.00)
LDL Cholesterol: 64 mg/dL (ref 0–99)
NonHDL: 75.83
Total CHOL/HDL Ratio: 3
Triglycerides: 58 mg/dL (ref 0.0–149.0)
VLDL: 11.6 mg/dL (ref 0.0–40.0)

## 2021-11-09 MED ORDER — ZOSTER VAC RECOMB ADJUVANTED 50 MCG/0.5ML IM SUSR: 0.5000 mL | Freq: Once | INTRAMUSCULAR | 1 refills | Status: AC

## 2021-11-09 NOTE — Progress Notes (Signed)
Subjective:  Patient ID: Richard Hardy, male    DOB: 1946-10-11  Age: 75 y.o. MRN: [id number]  CC: The primary encounter diagnosis was Primary hypertension. A diagnosis of Hyperlipidemia due to type 2 diabetes mellitus ([place]) was also pertinent to this visit.  HPI TIMON GEISSINGER presents for  Chief Complaint  Patient presents with   Follow-up    6 month follow up on diabetes, hypertension, hyperlipidemia    This visit occurred during the SARS-CoV-2 public health emergency.  Safety protocols were in place, including screening questions prior to the visit, additional usage of staff PPE, and extensive cleaning of exam room while observing appropriate contact time as indicated for disinfecting solutions.    He feels generally well, is exercising several times per week and checking blood sugars once daily at variable times.  BS have been under 130 fasting and < 150 post prandially.  Denies any recent hypoglyemic events.  Taking his medications as directed: metformin, glipizide and atorvastatin .  Following a carbohydrate modified diet 6 days per week. Denies numbness, burning and tingling of extremities. Appetite is good.  Walking one hour daily at the mall , paying golf twice weekly and doing yardwork     Hypertension: patient checks blood pressure twice weekly at home.  Readings have been for the most part < 140/80 at rest . Patient is following a reduce salt diet most days and is taking medications as prescribed  (amlodipine and hctz)    Outpatient Medications Prior to Visit  Medication Sig Dispense Refill   amLODipine (NORVASC) 5 MG tablet Take 1 tablet (5 mg total) by mouth daily. 90 tablet 3   atorvastatin (LIPITOR) 20 MG tablet Take 1 tablet (20 mg total) by mouth daily. 90 tablet 3   Blood Glucose Monitoring Suppl (ONETOUCH VERIO IQ SYSTEM) w/Device KIT Use to check blood sugars once daily. 1 kit 0   chlorhexidine (PERIDEX) 0.12 % solution SMARTSIG:0.5 Ounce(s) By Mouth Twice Daily      CINNAMON PO Take 2 capsules by mouth daily.     Coenzyme Q10 (COQ10) 200 MG CAPS Take 1 tablet by mouth daily.     Fish Oil OIL Take by mouth daily.     fluticasone (FLONASE) 50 MCG/ACT nasal spray Place into the nose at bedtime.     furosemide (LASIX) 20 MG tablet Take 0.5 tablets (10 mg total) by mouth daily. 90 tablet 3   glipiZIDE (GLUCOTROL) 5 MG tablet TAKE 1 TABLET (5 MG TOTAL) BY MOUTH 2 (TWO) TIMES DAILY BEFORE A MEAL. 180 tablet 1   glucosamine-chondroitin 500-400 MG tablet Take 1 tablet by mouth in the morning and at bedtime.     hydrochlorothiazide (HYDRODIURIL) 25 MG tablet Take 1 tablet (25 mg total) by mouth daily. 90 tablet 3   MAGNESIUM CITRATE PO Take 1 capsule by mouth in the morning and at bedtime.     metFORMIN (GLUCOPHAGE-XR) 500 MG 24 hr tablet TAKE 1 TABLET BY MOUTH EVERY DAY WITH BREAKFAST 90 tablet 1   Multiple Vitamin (MULTIVITAMIN) capsule Take 1 capsule by mouth daily.     mupirocin ointment (BACTROBAN) 2 % Apply 1 application topically 2 (two) times daily. 30 g 2   naproxen (NAPROSYN) 250 MG tablet PLEASE SEE ATTACHED FOR DETAILED DIRECTIONS     ONETOUCH DELICA LANCETS 88Q MISC Use to check blood sugars once daily. 100 each 1   ONETOUCH VERIO test strip USE TO CHECK SUGARS ONCE OR TWICE DAILY . 100 strip 1  Turmeric 500 MG TABS Take 1 tablet by mouth daily.     vitamin C (ASCORBIC ACID) 500 MG tablet Take 500 mg by mouth daily.     aspirin EC 81 MG tablet Take 1 tablet (81 mg total) by mouth daily. (Patient not taking: Reported on 11/09/2021) 90 tablet 4   amoxicillin (AMOXIL) 500 MG capsule Take 500 mg by mouth 4 (four) times daily. (Patient not taking: Reported on 08/08/2021)     No facility-administered medications prior to visit.    Review of Systems;  Patient denies headache, fevers, malaise, unintentional weight loss, skin rash, eye pain, sinus congestion and sinus pain, sore throat, dysphagia,  hemoptysis , cough, dyspnea, wheezing, chest pain,  palpitations, orthopnea, edema, abdominal pain, nausea, melena, diarrhea, constipation, flank pain, dysuria, hematuria, urinary  Frequency, nocturia, numbness, tingling, seizures,  Focal weakness, Loss of consciousness,  Tremor, insomnia, depression, anxiety, and suicidal ideation.      Objective:  BP 134/66 (BP Location: Left Arm, Patient Position: Sitting, Cuff Size: Normal)   Pulse (!) 46   Temp (!) 96 F (35.6 C) (Temporal)   Ht 5' 10" (1.778 m)   Wt 207 lb 6.4 oz (94.1 kg)   SpO2 98%   BMI 29.76 kg/m   BP Readings from Last 3 Encounters:  11/09/21 134/66  08/08/21 120/68  05/10/21 126/76    Wt Readings from Last 3 Encounters:  11/09/21 207 lb 6.4 oz (94.1 kg)  11/07/21 209 lb (94.8 kg)  08/08/21 209 lb (94.8 kg)    General appearance: alert, cooperative and appears stated age Ears: normal TM's and external ear canals both ears Throat: lips, mucosa, and tongue normal; teeth and gums normal Neck: no adenopathy, no carotid bruit, supple, symmetrical, trachea midline and thyroid not enlarged, symmetric, no tenderness/mass/nodules Back: symmetric, no curvature. ROM normal. No CVA tenderness. Lungs: clear to auscultation bilaterally Heart: regular rate and rhythm, S1, S2 normal, no murmur, click, rub or gallop Abdomen: soft, non-tender; bowel sounds normal; no masses,  no organomegaly Pulses: 2+ and symmetric Skin: Skin color, texture, turgor normal. No rashes or lesions Lymph nodes: Cervical, supraclavicular, and axillary nodes normal.  Lab Results  Component Value Date   HGBA1C 6.3 05/10/2021   HGBA1C 6.0 (A) 02/05/2021   HGBA1C 7.8 (H) 11/20/2020    Lab Results  Component Value Date   CREATININE 1.03 05/10/2021   CREATININE 1.09 02/05/2021   CREATININE 1.05 11/20/2020    Lab Results  Component Value Date   WBC 8.0 07/12/2019   HGB 14.9 07/12/2019   HCT 43.1 07/12/2019   PLT 162 07/12/2019   GLUCOSE 121 (H) 05/10/2021   CHOL 105 05/10/2021   TRIG 56.0  05/10/2021   HDL 45.20 05/10/2021   LDLDIRECT 48.0 05/10/2021   LDLCALC 48 05/10/2021   ALT 29 05/10/2021   AST 26 05/10/2021   NA 142 05/10/2021   K 4.3 05/10/2021   CL 105 05/10/2021   CREATININE 1.03 05/10/2021   BUN 21 05/10/2021   CO2 26 05/10/2021   TSH 2.320 07/09/2019   PSA 0.32 11/10/2019   HGBA1C 6.3 05/10/2021   MICROALBUR 1.2 05/10/2021    No results found.  Assessment & Plan:   Problem List Items Addressed This Visit     Hyperlipidemia due to type 2 diabetes mellitus ([place])    Diabetes remains well controlled and LDL is well under goal on current dose of atorvastatin. No changes today  Lab Results  Component Value Date   CHOL 105 05/10/2021  HDL 45.20 05/10/2021   LDLCALC 48 05/10/2021   LDLDIRECT 48.0 05/10/2021   TRIG 56.0 05/10/2021   CHOLHDL 2 05/10/2021         Relevant Orders   HgB A1c   Comp Met (CMET)   Lipid Profile   Hypertension - Primary    Well controlled on current regimen of hctz and amlodipine . HE HAS NO PROTEINURIA . Renal function has been stable, no changes today.  Lab Results  Component Value Date   CREATININE 1.03 05/10/2021   Lab Results  Component Value Date   NA 142 05/10/2021   K 4.3 05/10/2021   CL 105 05/10/2021   CO2 26 05/10/2021         Relevant Orders   Comp Met (CMET)    I have discontinued Elliot Dally. Cathey's amoxicillin. I am also having him start on Zoster Vaccine Adjuvanted. Additionally, I am having him maintain his CoQ10, Fish Oil, fluticasone, vitamin C, aspirin EC, CINNAMON PO, Turmeric, OneTouch Verio IQ System, OneTouch Delica Lancets 33L, multivitamin, MAGNESIUM CITRATE PO, glucosamine-chondroitin, chlorhexidine, OneTouch Verio, metFORMIN, glipiZIDE, naproxen, amLODipine, atorvastatin, furosemide, hydrochlorothiazide, and mupirocin ointment.  Meds ordered this encounter  Medications   Zoster Vaccine Adjuvanted [facility]) injection    Sig: Inject 0.5 mLs into the muscle once for 1 dose.     Dispense:  1 each    Refill:  1    Medications Discontinued During This Encounter  Medication Reason   amoxicillin (AMOXIL) 500 MG capsule     Follow-up: Return in about 6 months (around 05/10/2022) for follow up diabetes.   Crecencio Mc, MD

## 2021-11-09 NOTE — Assessment & Plan Note (Addendum)
Diabetes remains well controlled and LDL is well under goal on current dose of atorvastatin. No changes today  Lab Results  Component Value Date   CHOL 105 05/10/2021   HDL 45.20 05/10/2021   LDLCALC 48 05/10/2021   LDLDIRECT 48.0 05/10/2021   TRIG 56.0 05/10/2021   CHOLHDL 2 05/10/2021

## 2021-11-09 NOTE — Assessment & Plan Note (Signed)
Well controlled on current regimen of hctz and amlodipine . HE HAS NO PROTEINURIA . Renal function has been stable, no changes today.  Lab Results  Component Value Date   CREATININE 1.03 05/10/2021   Lab Results  Component Value Date   NA 142 05/10/2021   K 4.3 05/10/2021   CL 105 05/10/2021   CO2 26 05/10/2021

## 2021-11-21 DIAGNOSIS — M9905 Segmental and somatic dysfunction of pelvic region: Secondary | ICD-10-CM | POA: Diagnosis not present

## 2021-11-21 DIAGNOSIS — M9902 Segmental and somatic dysfunction of thoracic region: Secondary | ICD-10-CM | POA: Diagnosis not present

## 2021-11-21 DIAGNOSIS — M546 Pain in thoracic spine: Secondary | ICD-10-CM | POA: Diagnosis not present

## 2021-11-21 DIAGNOSIS — M5442 Lumbago with sciatica, left side: Secondary | ICD-10-CM | POA: Diagnosis not present

## 2021-11-21 DIAGNOSIS — M955 Acquired deformity of pelvis: Secondary | ICD-10-CM | POA: Diagnosis not present

## 2021-11-21 DIAGNOSIS — M9903 Segmental and somatic dysfunction of lumbar region: Secondary | ICD-10-CM | POA: Diagnosis not present

## 2021-12-19 IMAGING — DX DG HIP (WITH OR WITHOUT PELVIS) 2-3V*R*
3 series · 3 of 3 positions shown · non-contrast
Comparison: None.

CLINICAL DATA: Low back and right thigh region pain.

EXAM:
DG HIP (WITH OR WITHOUT PELVIS) 2-3V RIGHT

[pelvis ap]
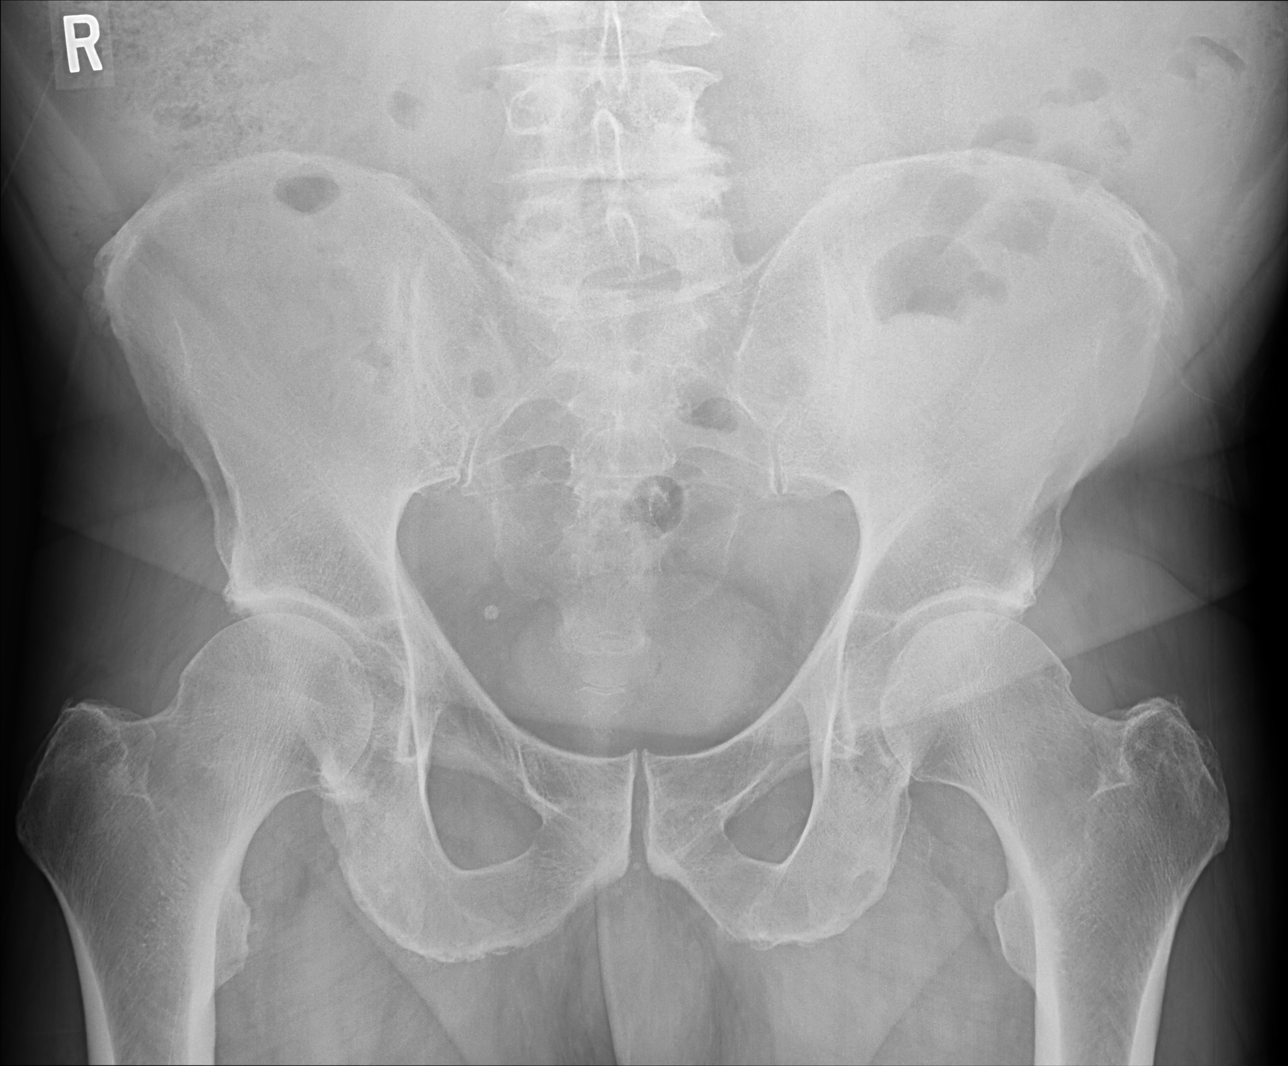

[hip joint ap]
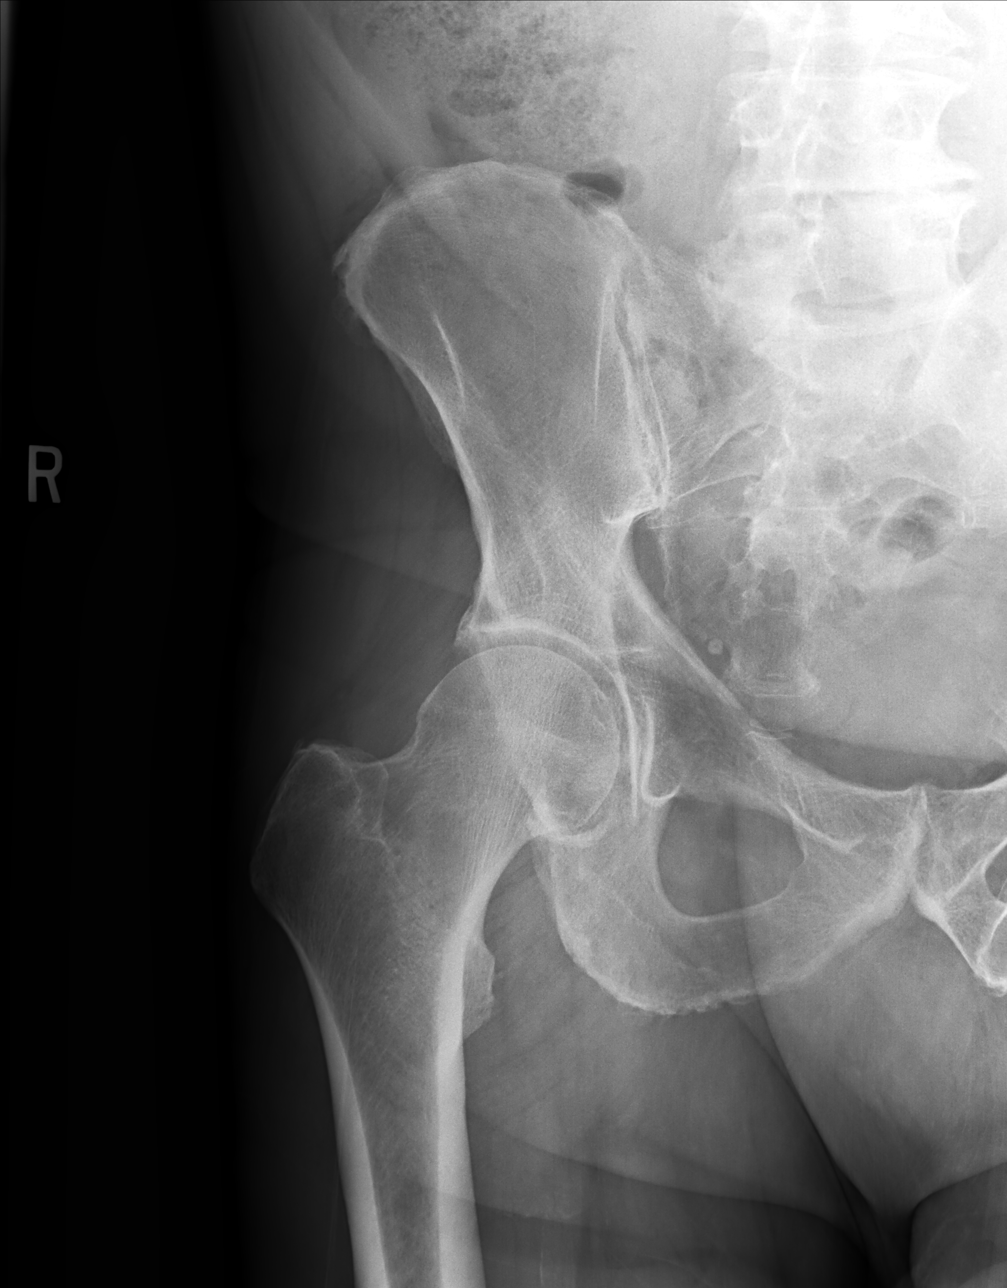

[ap frog obl (oblique)]
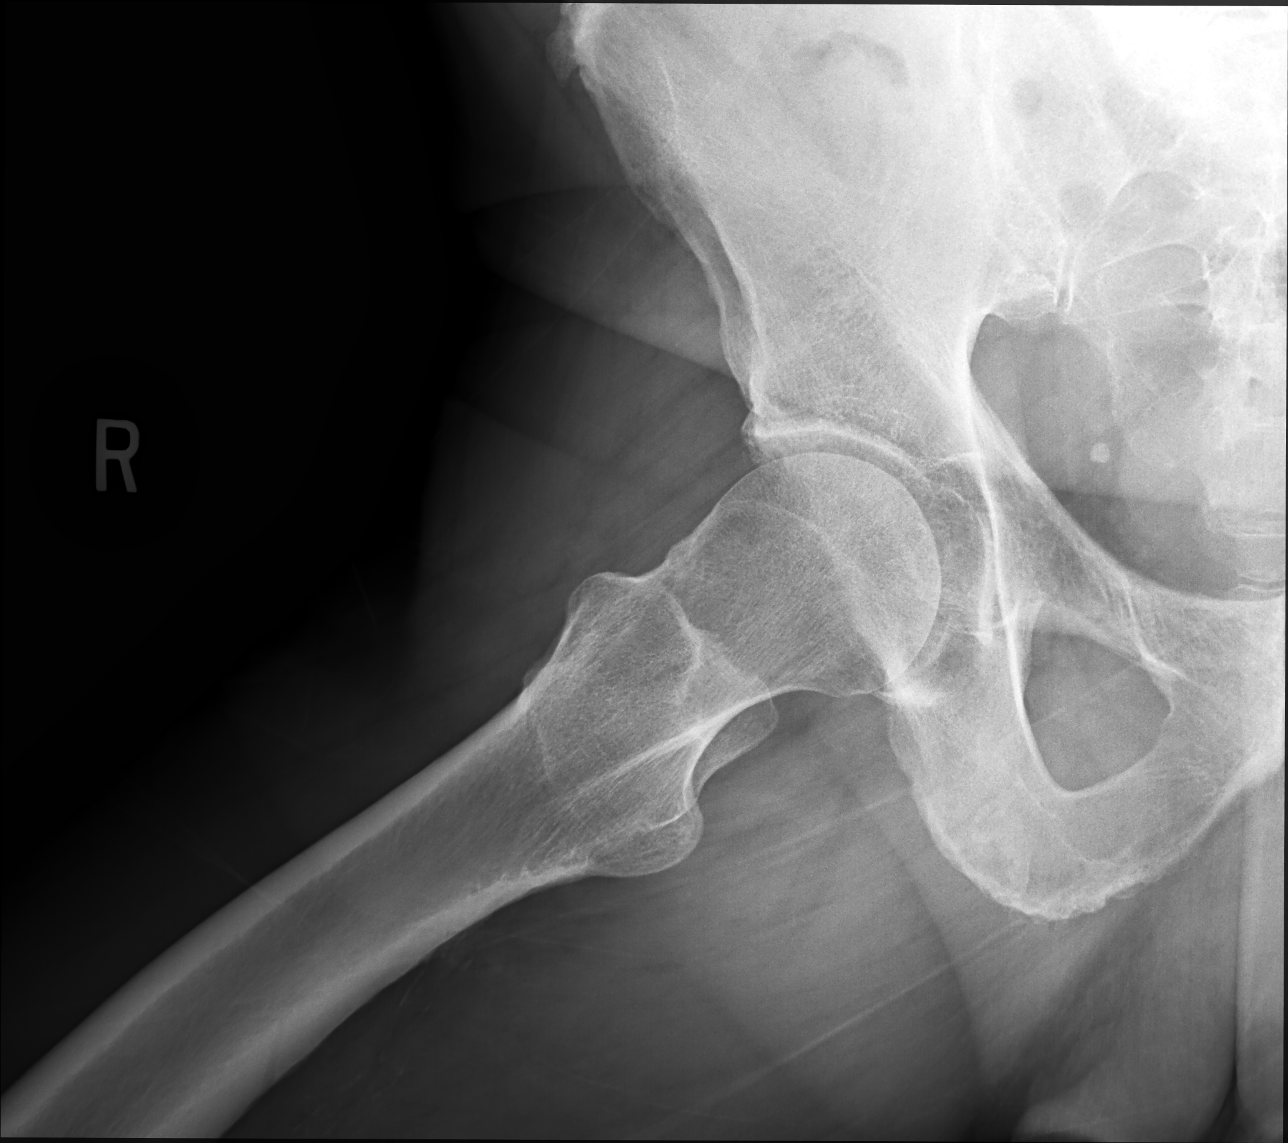

[3 of 3 positions shown; findings below may reference images not displayed]

FINDINGS: No fracture or bone lesion.

Hip joints normally spaced and aligned. There are minor marginal
osteophytes from the bases of the femoral heads. No other
degenerative/arthropathic change. SI joints and pubic symphysis are
normally spaced and aligned.

Soft tissues are unremarkable.
IMPRESSION: 1. No fracture or acute finding.  No bone lesion.
2. Minimal bilateral hip joint degenerative change. No other
abnormality.

## 2021-12-25 DIAGNOSIS — M5441 Lumbago with sciatica, right side: Secondary | ICD-10-CM | POA: Diagnosis not present

## 2021-12-25 DIAGNOSIS — M9901 Segmental and somatic dysfunction of cervical region: Secondary | ICD-10-CM | POA: Diagnosis not present

## 2021-12-25 DIAGNOSIS — M9902 Segmental and somatic dysfunction of thoracic region: Secondary | ICD-10-CM | POA: Diagnosis not present

## 2021-12-25 DIAGNOSIS — M9903 Segmental and somatic dysfunction of lumbar region: Secondary | ICD-10-CM | POA: Diagnosis not present

## 2021-12-25 DIAGNOSIS — M531 Cervicobrachial syndrome: Secondary | ICD-10-CM | POA: Diagnosis not present

## 2021-12-25 DIAGNOSIS — M9905 Segmental and somatic dysfunction of pelvic region: Secondary | ICD-10-CM | POA: Diagnosis not present

## 2021-12-25 DIAGNOSIS — M546 Pain in thoracic spine: Secondary | ICD-10-CM | POA: Diagnosis not present

## 2021-12-25 DIAGNOSIS — M5442 Lumbago with sciatica, left side: Secondary | ICD-10-CM | POA: Diagnosis not present

## 2021-12-25 DIAGNOSIS — M955 Acquired deformity of pelvis: Secondary | ICD-10-CM | POA: Diagnosis not present

## 2022-01-13 ENCOUNTER — Other Ambulatory Visit: Payer: Self-pay | Admitting: Internal Medicine

## 2022-01-17 DIAGNOSIS — D3131 Benign neoplasm of right choroid: Secondary | ICD-10-CM | POA: Diagnosis not present

## 2022-01-17 LAB — HM DIABETES EYE EXAM

## 2022-01-29 DIAGNOSIS — M9905 Segmental and somatic dysfunction of pelvic region: Secondary | ICD-10-CM | POA: Diagnosis not present

## 2022-01-29 DIAGNOSIS — M955 Acquired deformity of pelvis: Secondary | ICD-10-CM | POA: Diagnosis not present

## 2022-01-29 DIAGNOSIS — M531 Cervicobrachial syndrome: Secondary | ICD-10-CM | POA: Diagnosis not present

## 2022-01-29 DIAGNOSIS — M9903 Segmental and somatic dysfunction of lumbar region: Secondary | ICD-10-CM | POA: Diagnosis not present

## 2022-01-29 DIAGNOSIS — M5441 Lumbago with sciatica, right side: Secondary | ICD-10-CM | POA: Diagnosis not present

## 2022-01-29 DIAGNOSIS — M9901 Segmental and somatic dysfunction of cervical region: Secondary | ICD-10-CM | POA: Diagnosis not present

## 2022-01-30 DIAGNOSIS — G4733 Obstructive sleep apnea (adult) (pediatric): Secondary | ICD-10-CM | POA: Diagnosis not present

## 2022-02-21 ENCOUNTER — Ambulatory Visit: Payer: Medicare HMO | Admitting: Dermatology

## 2022-02-27 DIAGNOSIS — G4733 Obstructive sleep apnea (adult) (pediatric): Secondary | ICD-10-CM | POA: Diagnosis not present

## 2022-03-05 DIAGNOSIS — M531 Cervicobrachial syndrome: Secondary | ICD-10-CM | POA: Diagnosis not present

## 2022-03-05 DIAGNOSIS — M955 Acquired deformity of pelvis: Secondary | ICD-10-CM | POA: Diagnosis not present

## 2022-03-05 DIAGNOSIS — M9901 Segmental and somatic dysfunction of cervical region: Secondary | ICD-10-CM | POA: Diagnosis not present

## 2022-03-05 DIAGNOSIS — M9903 Segmental and somatic dysfunction of lumbar region: Secondary | ICD-10-CM | POA: Diagnosis not present

## 2022-03-05 DIAGNOSIS — M9905 Segmental and somatic dysfunction of pelvic region: Secondary | ICD-10-CM | POA: Diagnosis not present

## 2022-03-05 DIAGNOSIS — M5441 Lumbago with sciatica, right side: Secondary | ICD-10-CM | POA: Diagnosis not present

## 2022-03-06 ENCOUNTER — Other Ambulatory Visit: Payer: Self-pay

## 2022-03-06 ENCOUNTER — Ambulatory Visit: Payer: Medicare HMO | Admitting: Dermatology

## 2022-03-06 DIAGNOSIS — Z1283 Encounter for screening for malignant neoplasm of skin: Secondary | ICD-10-CM

## 2022-03-06 DIAGNOSIS — L821 Other seborrheic keratosis: Secondary | ICD-10-CM

## 2022-03-06 DIAGNOSIS — Z85828 Personal history of other malignant neoplasm of skin: Secondary | ICD-10-CM | POA: Diagnosis not present

## 2022-03-06 DIAGNOSIS — D229 Melanocytic nevi, unspecified: Secondary | ICD-10-CM

## 2022-03-06 DIAGNOSIS — L814 Other melanin hyperpigmentation: Secondary | ICD-10-CM | POA: Diagnosis not present

## 2022-03-06 DIAGNOSIS — L570 Actinic keratosis: Secondary | ICD-10-CM

## 2022-03-06 DIAGNOSIS — D692 Other nonthrombocytopenic purpura: Secondary | ICD-10-CM | POA: Diagnosis not present

## 2022-03-06 DIAGNOSIS — L820 Inflamed seborrheic keratosis: Secondary | ICD-10-CM | POA: Diagnosis not present

## 2022-03-06 DIAGNOSIS — D1721 Benign lipomatous neoplasm of skin and subcutaneous tissue of right arm: Secondary | ICD-10-CM

## 2022-03-06 DIAGNOSIS — L578 Other skin changes due to chronic exposure to nonionizing radiation: Secondary | ICD-10-CM

## 2022-03-06 DIAGNOSIS — D1800 Hemangioma unspecified site: Secondary | ICD-10-CM

## 2022-03-06 NOTE — Progress Notes (Signed)
? ?Follow-Up Visit ?  ?Subjective  ?Richard Hardy is a 76 y.o. male who presents for the following: Annual Exam (History of BCC - TBSE today). ?The patient presents for Total-Body Skin Exam (TBSE) for skin cancer screening and mole check.  The patient has spots, moles and lesions to be evaluated, some may be new or changing and the patient has concerns that these could be cancer. ? ?The following portions of the chart were reviewed this encounter and updated as appropriate:  ? Tobacco  Allergies  Meds  Problems  Med Hx  Surg Hx  Fam Hx   ?  ?Review of Systems:  No other skin or systemic complaints except as noted in HPI or Assessment and Plan. ? ?Objective  ?Well appearing patient in no apparent distress; mood and affect are within normal limits. ? ?A full examination was performed including scalp, head, eyes, ears, nose, lips, neck, chest, axillae, abdomen, back, buttocks, bilateral upper extremities, bilateral lower extremities, hands, feet, fingers, toes, fingernails, and toenails. All findings within normal limits unless otherwise noted below. ? ?Right arm x 4, left arm 1, left knee x 1, left cheek x 1, back x 14 (21) ?Erythematous stuck-on, waxy papule or plaque ? ?Right cheek ?Erythematous thin papules/macules with gritty scale.  ? ?Right post deltoid ?Rubbery nodule ? ? ?Assessment & Plan  ? ?History of Basal Cell Carcinoma of the Skin ?- No evidence of recurrence today ?- Recommend regular full body skin exams ?- Recommend daily broad spectrum sunscreen SPF 30+ to sun-exposed areas, reapply every 2 hours as needed.  ?- Call if any new or changing lesions are noted between office visits ? ?Purpura - Chronic; persistent and recurrent.  Treatable, but not curable. ?- Violaceous macules and patches ?- Benign ?- Related to trauma, age, sun damage and/or use of blood thinners, chronic use of topical and/or oral steroids ?- Observe ?- Can use OTC arnica containing moisturizer such as Dermend Bruise Formula  if desired ?- Call for worsening or other concerns ? ?Lentigines ?- Scattered tan macules ?- Due to sun exposure ?- Benign-appearing, observe ?- Recommend daily broad spectrum sunscreen SPF 30+ to sun-exposed areas, reapply every 2 hours as needed. ?- Call for any changes ? ?Seborrheic Keratoses ?- Stuck-on, waxy, tan-brown papules and/or plaques  ?- Benign-appearing ?- Discussed benign etiology and prognosis. ?- Observe ?- Call for any changes ? ?Melanocytic Nevi ?- Tan-brown and/or pink-flesh-colored symmetric macules and papules ?- Benign appearing on exam today ?- Observation ?- Call clinic for new or changing moles ?- Recommend daily use of broad spectrum spf 30+ sunscreen to sun-exposed areas.  ? ?Hemangiomas ?- Red papules ?- Discussed benign nature ?- Observe ?- Call for any changes ? ?Actinic Damage ?- Chronic condition, secondary to cumulative UV/sun exposure ?- diffuse scaly erythematous macules with underlying dyspigmentation ?- Recommend daily broad spectrum sunscreen SPF 30+ to sun-exposed areas, reapply every 2 hours as needed.  ?- Staying in the shade or wearing long sleeves, sun glasses (UVA+UVB protection) and wide brim hats (4-inch brim around the entire circumference of the hat) are also recommended for sun protection.  ?- Call for new or changing lesions. ? ?Skin cancer screening performed today. ? ?Inflamed seborrheic keratosis (21) ?Right arm x 4, left arm 1, left knee x 1, left cheek x 1, back x 14 ?Destruction of lesion - Right arm x 4, left arm 1, left knee x 1, left cheek x 1, back x 14 ?Complexity: simple   ?Destruction method:  cryotherapy   ?Informed consent: discussed and consent obtained   ?Timeout:  patient name, date of birth, surgical site, and procedure verified ?Lesion destroyed using liquid nitrogen: Yes   ?Region frozen until ice ball extended beyond lesion: Yes   ?Outcome: patient tolerated procedure well with no complications   ?Post-procedure details: wound care instructions  given   ? ?AK (actinic keratosis) ?Right cheek ?Destruction of lesion - Right cheek ?Complexity: simple   ?Destruction method: cryotherapy   ?Informed consent: discussed and consent obtained   ?Timeout:  patient name, date of birth, surgical site, and procedure verified ?Lesion destroyed using liquid nitrogen: Yes   ?Region frozen until ice ball extended beyond lesion: Yes   ?Outcome: patient tolerated procedure well with no complications   ?Post-procedure details: wound care instructions given   ? ?Lipoma of right upper extremity ?Right post deltoid ?Benign-appearing.  Observation.  Call clinic for new or changing lesions.  Recommend daily use of broad spectrum spf 30+ sunscreen to sun-exposed areas.  ? ?Skin cancer screening ? ?Return in about 1 year (around 03/07/2023) for TBSE. ? ?I, Ashok Cordia, CMA, am acting as scribe for Sarina Ser, MD . ?Documentation: I have reviewed the above documentation for accuracy and completeness, and I agree with the above. ? ?Sarina Ser, MD ? ?

## 2022-03-06 NOTE — Patient Instructions (Signed)

## 2022-03-07 ENCOUNTER — Encounter: Payer: Self-pay | Admitting: Dermatology

## 2022-03-30 DIAGNOSIS — G4733 Obstructive sleep apnea (adult) (pediatric): Secondary | ICD-10-CM | POA: Diagnosis not present

## 2022-04-09 DIAGNOSIS — M9905 Segmental and somatic dysfunction of pelvic region: Secondary | ICD-10-CM | POA: Diagnosis not present

## 2022-04-09 DIAGNOSIS — M9903 Segmental and somatic dysfunction of lumbar region: Secondary | ICD-10-CM | POA: Diagnosis not present

## 2022-04-09 DIAGNOSIS — M9901 Segmental and somatic dysfunction of cervical region: Secondary | ICD-10-CM | POA: Diagnosis not present

## 2022-04-09 DIAGNOSIS — M5441 Lumbago with sciatica, right side: Secondary | ICD-10-CM | POA: Diagnosis not present

## 2022-04-09 DIAGNOSIS — M955 Acquired deformity of pelvis: Secondary | ICD-10-CM | POA: Diagnosis not present

## 2022-04-09 DIAGNOSIS — M531 Cervicobrachial syndrome: Secondary | ICD-10-CM | POA: Diagnosis not present

## 2022-05-09 ENCOUNTER — Encounter: Payer: Self-pay | Admitting: Internal Medicine

## 2022-05-10 ENCOUNTER — Ambulatory Visit (INDEPENDENT_AMBULATORY_CARE_PROVIDER_SITE_OTHER): Payer: Medicare HMO | Admitting: Internal Medicine

## 2022-05-10 ENCOUNTER — Encounter: Payer: Self-pay | Admitting: Internal Medicine

## 2022-05-10 VITALS — BP 130/76 | HR 50 | Temp 97.7°F | Ht 70.0 in | Wt 212.2 lb

## 2022-05-10 DIAGNOSIS — E1169 Type 2 diabetes mellitus with other specified complication: Secondary | ICD-10-CM

## 2022-05-10 DIAGNOSIS — I872 Venous insufficiency (chronic) (peripheral): Secondary | ICD-10-CM | POA: Diagnosis not present

## 2022-05-10 DIAGNOSIS — E785 Hyperlipidemia, unspecified: Secondary | ICD-10-CM

## 2022-05-10 DIAGNOSIS — I10 Essential (primary) hypertension: Secondary | ICD-10-CM

## 2022-05-10 DIAGNOSIS — I700 Atherosclerosis of aorta: Secondary | ICD-10-CM | POA: Diagnosis not present

## 2022-05-10 LAB — COMPREHENSIVE METABOLIC PANEL
ALT: 31 U/L (ref 0–53)
AST: 22 U/L (ref 0–37)
Albumin: 4.2 g/dL (ref 3.5–5.2)
Alkaline Phosphatase: 55 U/L (ref 39–117)
BUN: 20 mg/dL (ref 6–23)
CO2: 31 mEq/L (ref 19–32)
Calcium: 9.3 mg/dL (ref 8.4–10.5)
Chloride: 104 mEq/L (ref 96–112)
Creatinine, Ser: 1.05 mg/dL (ref 0.40–1.50)
GFR: 68.09 mL/min (ref >60.00)
Glucose, Bld: 134 mg/dL — ABNORMAL HIGH (ref 70–99)
Potassium: 4.4 mEq/L (ref 3.5–5.1)
Sodium: 140 mEq/L (ref 135–145)
Total Bilirubin: 1.1 mg/dL (ref 0.2–1.2)
Total Protein: 6.5 g/dL (ref 6.0–8.3)

## 2022-05-10 LAB — MICROALBUMIN / CREATININE URINE RATIO
Creatinine,U: 121.9 mg/dL
Microalb Creat Ratio: 0.9 mg/g (ref 0.0–30.0)
Microalb, Ur: 1.1 mg/dL (ref 0.0–1.9)

## 2022-05-10 LAB — HEMOGLOBIN A1C: Hgb A1c MFr Bld: 7 % — ABNORMAL HIGH (ref 4.6–6.5)

## 2022-05-10 LAB — LIPID PANEL
Cholesterol: 105 mg/dL (ref 0–200)
HDL: 44.7 mg/dL (ref >39.00)
LDL Cholesterol: 49 mg/dL (ref 0–99)
NonHDL: 60.57
Total CHOL/HDL Ratio: 2
Triglycerides: 58 mg/dL (ref 0.0–149.0)
VLDL: 11.6 mg/dL (ref 0.0–40.0)

## 2022-05-10 LAB — LDL CHOLESTEROL, DIRECT: Direct LDL: 55 mg/dL

## 2022-05-10 MED ORDER — ZOSTER VAC RECOMB ADJUVANTED 50 MCG/0.5ML IM SUSR: 0.5000 mL | Freq: Once | INTRAMUSCULAR | 1 refills | Status: AC

## 2022-05-10 NOTE — Assessment & Plan Note (Addendum)
He is managng diabetes with metformin and glipizide.  Tolerating atorvastatin  .  No history of proteinuria.  Eye exams are done annually by [facility] in February   Records requested   Lab Results  Component Value Date   HGBA1C 7.0 (H) 05/10/2022   Lab Results  Component Value Date   MICROALBUR 1.1 05/10/2022   MICROALBUR 1.2 05/10/2021    Lab Results  Component Value Date   LDLCALC 49 05/10/2022

## 2022-05-10 NOTE — Assessment & Plan Note (Signed)
Well controlled on current regimen of hctz and amlodipine . HE HAS NO history of PROTEINURIA .

## 2022-05-10 NOTE — Patient Instructions (Signed)
Please reduce your use of furosemide to once every 3 days.  Stop taking it daily because it can lead to dehydration   If you want to eat honeydew, cantaloupe and watermelon,  check your sugar 2 hours after eating.  If your blood sugar is 160 or less,  it's fine to eat daily.  If > 200,  avoid daily use'   Your tetanus vaccine is due next summer.  But if you get a cut  or animal bite this summer [place] .    The Tdap (tetanus-diphtheria-whooping cough vaccine ) and Shingrix vaccines are now  COVERED BY MEDICARE if you get them at your pharmacy   You can expect 24 hours of flu like symptoms after receiving the shingles vaccine, so plan accordingly.

## 2022-05-10 NOTE — Assessment & Plan Note (Signed)
Reviewed findings of prior CT scan  Again and the prognostic implications.   Patient is tolerating high potency statin therapy

## 2022-05-10 NOTE — Progress Notes (Signed)
Subjective:  Patient ID: Richard Hardy, male    DOB: [birth year]  Age: 76 y.o. MRN: [id number]  CC: The primary encounter diagnosis was Primary hypertension. Diagnoses of Hyperlipidemia due to type 2 diabetes mellitus ([place]) and Abdominal aortic atherosclerosis ([place]) were also pertinent to this visit.   HPI Richard Hardy presents for  follow up on diabetes. hyperlipidemia, hypertension, and chronic venous insufficiency  Chief Complaint  Patient presents with   Follow-up    6 month follow up on diabetes    1) T2DM:   He feels generally well, is exercising several times per week and checking blood sugars once daily at variable times.  BS have been under 130 fasting and < 150 post prandially.  Denies any recent hypoglyemic events.  Taking his medications as directed: metformin and glipizide Following a carbohydrate modified diet 6 days per week. Denies numbness, burning and tingling of extremities. Appetite is good.     2) HTN:  has been taking hctz 25 mg daily and amlodipine 5 mg .   has increased furosemide from every 3 days to 10 mg daily ,  for LE edema chronically worse on the right side,  rarely above the ankle.   3) OSA on CPAP:  wearing CPAP   Outpatient Medications Prior to Visit  Medication Sig Dispense Refill   amLODipine (NORVASC) 5 MG tablet Take 1 tablet (5 mg total) by mouth daily. 90 tablet 3   atorvastatin (LIPITOR) 20 MG tablet Take 1 tablet (20 mg total) by mouth daily. 90 tablet 3   Blood Glucose Monitoring Suppl (ONETOUCH VERIO IQ SYSTEM) w/Device KIT Use to check blood sugars once daily. 1 kit 0   chlorhexidine (PERIDEX) 0.12 % solution SMARTSIG:0.5 Ounce(s) By Mouth Twice Daily     CINNAMON PO Take 2 capsules by mouth daily.     Coenzyme Q10 (COQ10) 200 MG CAPS Take 1 tablet by mouth daily.     Fish Oil OIL Take by mouth daily.     fluticasone (FLONASE) 50 MCG/ACT nasal spray Place into the nose at bedtime.     furosemide (LASIX) 20 MG tablet Take 0.5 tablets (10  mg total) by mouth daily. 90 tablet 3   glipiZIDE (GLUCOTROL) 5 MG tablet TAKE 1 TABLET (5 MG TOTAL) BY MOUTH 2 (TWO) TIMES DAILY BEFORE A MEAL. 180 tablet 1   glucosamine-chondroitin 500-400 MG tablet Take 1 tablet by mouth in the morning and at bedtime.     hydrochlorothiazide (HYDRODIURIL) 25 MG tablet Take 1 tablet (25 mg total) by mouth daily. 90 tablet 3   MAGNESIUM CITRATE PO Take 1 capsule by mouth in the morning and at bedtime.     metFORMIN (GLUCOPHAGE-XR) 500 MG 24 hr tablet TAKE 1 TABLET BY MOUTH EVERY DAY WITH BREAKFAST 90 tablet 1   Multiple Vitamin (MULTIVITAMIN) capsule Take 1 capsule by mouth daily.     mupirocin ointment (BACTROBAN) 2 % Apply 1 application topically 2 (two) times daily. 30 g 2   naproxen (NAPROSYN) 250 MG tablet PLEASE SEE ATTACHED FOR DETAILED DIRECTIONS     ONETOUCH DELICA LANCETS 17R MISC Use to check blood sugars once daily. 100 each 1   ONETOUCH VERIO test strip USE TO CHECK SUGARS ONCE OR TWICE DAILY . 100 strip 1   Turmeric 500 MG TABS Take 1 tablet by mouth daily.     vitamin C (ASCORBIC ACID) 500 MG tablet Take 500 mg by mouth daily.     aspirin EC 81 MG  tablet Take 1 tablet (81 mg total) by mouth daily. (Patient not taking: Reported on 11/09/2021) 90 tablet 4   No facility-administered medications prior to visit.    Review of Systems;  Patient denies headache, fevers, malaise, unintentional weight loss, skin rash, eye pain, sinus congestion and sinus pain, sore throat, dysphagia,  hemoptysis , cough, dyspnea, wheezing, chest pain, palpitations, orthopnea, edema, abdominal pain, nausea, melena, diarrhea, constipation, flank pain, dysuria, hematuria, urinary  Frequency, nocturia, numbness, tingling, seizures,  Focal weakness, Loss of consciousness,  Tremor, insomnia, depression, anxiety, and suicidal ideation.      Objective:  BP 130/76 (BP Location: Left Arm, Patient Position: Sitting, Cuff Size: Large)   Pulse (!) 50   Temp 97.7 F (36.5 C)  (Oral)   Ht _0  (1.778 m)   Wt 212 lb 3.2 oz (96.3 kg)   SpO2 96%   BMI 30.45 kg/m   BP Readings from Last 3 Encounters:  05/10/22 130/76  11/09/21 134/66  08/08/21 120/68    Wt Readings from Last 3 Encounters:  05/10/22 212 lb 3.2 oz (96.3 kg)  11/09/21 207 lb 6.4 oz (94.1 kg)  11/07/21 209 lb (94.8 kg)    General appearance: alert, cooperative and appears stated age Ears: normal TM's and external ear canals both ears Throat: lips, mucosa, and tongue normal; teeth and gums normal Neck: no adenopathy, no carotid bruit, supple, symmetrical, trachea midline and thyroid not enlarged, symmetric, no tenderness/mass/nodules Back: symmetric, no curvature. ROM normal. No CVA tenderness. Lungs: clear to auscultation bilaterally Heart: regular rate and rhythm, S1, S2 normal, no murmur, click, rub or gallop Abdomen: soft, non-tender; bowel sounds normal; no masses,  no organomegaly Pulses: 2+ and symmetric Skin: Skin color, texture, turgor normal. No rashes or lesions Lymph nodes: Cervical, supraclavicular, and axillary nodes normal.  Lab Results  Component Value Date   HGBA1C 7.0 (H) 05/10/2022   HGBA1C 6.6 (H) 11/09/2021   HGBA1C 6.3 05/10/2021    Lab Results  Component Value Date   CREATININE 1.05 05/10/2022   CREATININE 0.96 11/09/2021   CREATININE 1.03 05/10/2021    Lab Results  Component Value Date   WBC 8.0 07/12/2019   HGB 14.9 07/12/2019   HCT 43.1 07/12/2019   PLT 162 07/12/2019   GLUCOSE 134 (H) 05/10/2022   CHOL 105 05/10/2022   TRIG 58.0 05/10/2022   HDL 44.70 05/10/2022   LDLDIRECT 55.0 05/10/2022   LDLCALC 49 05/10/2022   ALT 31 05/10/2022   AST 22 05/10/2022   NA 140 05/10/2022   K 4.4 05/10/2022   CL 104 05/10/2022   CREATININE 1.05 05/10/2022   BUN 20 05/10/2022   CO2 31 05/10/2022   TSH 2.320 07/09/2019   PSA 0.32 11/10/2019   HGBA1C 7.0 (H) 05/10/2022   MICROALBUR 1.1 05/10/2022    No results found.  Assessment & Plan:   Problem  List Items Addressed This Visit     Abdominal aortic atherosclerosis ([place])    Reviewed findings of prior CT scan  Again and the prognostic implications.   Patient is tolerating high potency statin therapy        Hyperlipidemia due to type 2 diabetes mellitus ([place])    He is managng diabetes with metformin and glipizide.  Tolerating atorvastatin  .  No history of proteinuria.  Eye exams are done annually by [facility] in February   Records requested   Lab Results  Component Value Date   HGBA1C 7.0 (H) 05/10/2022   Lab Results  Component Value Date   MICROALBUR 1.1 05/10/2022   MICROALBUR 1.2 05/10/2021    Lab Results  Component Value Date   LDLCALC 49 05/10/2022          Relevant Orders   Lipid Profile (Completed)   Direct LDL (Completed)   HgB A1c (Completed)   Hypertension - Primary    Well controlled on current regimen of hctz and amlodipine . HE HAS NO history of PROTEINURIA .       Relevant Orders   Comp Met (CMET) (Completed)   Urine Microalbumin w/creat. ratio (Completed)    I spent a total of 26 minutes with this patient in a face to face visit on the date of this encounter reviewing the last office visit with me 6  months ago, his last OV with cardioogist with Dr Rockey Situ in August.  his home BS ad BP readings.   Patient's diet and eating habits, and post visit ordering of testing and therapeutics.    Follow-up: Return in about 6 months (around 11/09/2022).   Crecencio Mc, MD

## 2022-05-12 ENCOUNTER — Encounter: Payer: Self-pay | Admitting: Internal Medicine

## 2022-05-12 NOTE — Assessment & Plan Note (Signed)
Advised to reduce use of diuretic to every 3 days andto use compression stockings  And  Leg elevation

## 2022-05-14 DIAGNOSIS — M5441 Lumbago with sciatica, right side: Secondary | ICD-10-CM | POA: Diagnosis not present

## 2022-05-14 DIAGNOSIS — M9903 Segmental and somatic dysfunction of lumbar region: Secondary | ICD-10-CM | POA: Diagnosis not present

## 2022-05-14 DIAGNOSIS — M955 Acquired deformity of pelvis: Secondary | ICD-10-CM | POA: Diagnosis not present

## 2022-05-14 DIAGNOSIS — M9905 Segmental and somatic dysfunction of pelvic region: Secondary | ICD-10-CM | POA: Diagnosis not present

## 2022-05-14 DIAGNOSIS — M531 Cervicobrachial syndrome: Secondary | ICD-10-CM | POA: Diagnosis not present

## 2022-05-14 DIAGNOSIS — M9901 Segmental and somatic dysfunction of cervical region: Secondary | ICD-10-CM | POA: Diagnosis not present

## 2022-06-18 DIAGNOSIS — M5441 Lumbago with sciatica, right side: Secondary | ICD-10-CM | POA: Diagnosis not present

## 2022-06-18 DIAGNOSIS — M9905 Segmental and somatic dysfunction of pelvic region: Secondary | ICD-10-CM | POA: Diagnosis not present

## 2022-06-18 DIAGNOSIS — M9901 Segmental and somatic dysfunction of cervical region: Secondary | ICD-10-CM | POA: Diagnosis not present

## 2022-06-18 DIAGNOSIS — M955 Acquired deformity of pelvis: Secondary | ICD-10-CM | POA: Diagnosis not present

## 2022-06-18 DIAGNOSIS — M9903 Segmental and somatic dysfunction of lumbar region: Secondary | ICD-10-CM | POA: Diagnosis not present

## 2022-06-18 DIAGNOSIS — M531 Cervicobrachial syndrome: Secondary | ICD-10-CM | POA: Diagnosis not present

## 2022-07-11 ENCOUNTER — Other Ambulatory Visit: Payer: Self-pay | Admitting: Internal Medicine

## 2022-07-23 DIAGNOSIS — M5441 Lumbago with sciatica, right side: Secondary | ICD-10-CM | POA: Diagnosis not present

## 2022-07-23 DIAGNOSIS — M531 Cervicobrachial syndrome: Secondary | ICD-10-CM | POA: Diagnosis not present

## 2022-07-23 DIAGNOSIS — M9903 Segmental and somatic dysfunction of lumbar region: Secondary | ICD-10-CM | POA: Diagnosis not present

## 2022-07-23 DIAGNOSIS — M9905 Segmental and somatic dysfunction of pelvic region: Secondary | ICD-10-CM | POA: Diagnosis not present

## 2022-07-23 DIAGNOSIS — M9901 Segmental and somatic dysfunction of cervical region: Secondary | ICD-10-CM | POA: Diagnosis not present

## 2022-07-23 DIAGNOSIS — M955 Acquired deformity of pelvis: Secondary | ICD-10-CM | POA: Diagnosis not present

## 2022-08-03 NOTE — Progress Notes (Unsigned)
Patient ID: JES COSTALES, male   DOB: May 01, 1946, 76 y.o.   MRN: [id number] Cardiology Office Note  Date:  08/06/2022   ID:  Leighton, Brickley 06-Feb-1946, MRN [id number]  PCP:  Crecencio Mc, MD   Chief Complaint  Patient presents with   12 month follow up     "Doing well." Medications reviewed by the patient verbally.     HPI:  Mr. Yon is a 76 year-old male with a history of   hyperlipidemia,  glucose intolerance, obstructive sleep apnea on CPAP,  Borderline diabetes,   Asymptomatic bradycardia strong family history of coronary artery disease  who returns for routine followup of his hyperlipidemia. 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      LOV 8/22 Doing well Active at baseline, playing golf, does much of his yard work, mowing Denies any near-syncope or syncope Rare short-lived orthostasis symptoms when bending down coming back up Working on his diet to get A1c down No chest pain concerning for angina  Takes Lasix approximately every third day No significant leg swelling  Lab work reviewed A1C up to 7.0, up from 6.0 LDL 55  He does not feel he is having symptoms from bradycardia  previously worked  at [employer], long hours Remains on CPAP  EKG personally reviewed by myself on todays visit Sinus bradycardia rate 46 bpm, poor R wave progression, nonspecific T wave abnormality  Other past medical history  seen by Dr. Grayland Ormond of hematology oncology for prior drop in his platelets. Aspirin was held through this.     PMH:   has a past medical history of Arthritis, Basal cell carcinoma (02/02/2020), Diabetes mellitus without complication ([place]) (8182), Dysrhythmia, Glucose intolerance (impaired glucose tolerance), Hyperkalemia (05/21/2017), Hyperlipidemia, Obesity, Sleep apnea, and Thrombocythemia.  PSH:    Past Surgical History:  Procedure Laterality Date   CATARACT EXTRACTION, BILATERAL     COLONOSCOPY WITH PROPOFOL N/A 06/23/2017    Procedure: COLONOSCOPY WITH PROPOFOL;  Surgeon: Manya Silvas, MD;  Location: [facility] ENDOSCOPY;  Service: Endoscopy;  Laterality: N/A;   COLONOSCOPY WITH PROPOFOL N/A 10/16/2020   Procedure: COLONOSCOPY WITH PROPOFOL;  Surgeon: Toledo, Benay Pike, MD;  Location: [facility] ENDOSCOPY;  Service: Gastroenterology;  Laterality: N/A;   EYE SURGERY     LITHOTRIPSY     Dr. Bernardo Heater   NASAL SEPTUM SURGERY     TONSILLECTOMY      Current Outpatient Medications  Medication Sig Dispense Refill   Blood Glucose Monitoring Suppl (ONETOUCH VERIO IQ SYSTEM) w/Device KIT Use to check blood sugars once daily. 1 kit 0   chlorhexidine (PERIDEX) 0.12 % solution SMARTSIG:0.5 Ounce(s) By Mouth Twice Daily     CINNAMON PO Take 2 capsules by mouth daily.     Coenzyme Q10 (COQ10) 200 MG CAPS Take 1 tablet by mouth daily.     Fish Oil OIL Take by mouth daily.     fluticasone (FLONASE) 50 MCG/ACT nasal spray Place into the nose at bedtime.     glipiZIDE (GLUCOTROL) 5 MG tablet TAKE 1 TABLET (5 MG TOTAL) BY MOUTH 2 (TWO) TIMES DAILY BEFORE A MEAL. 180 tablet 1   glucosamine-chondroitin 500-400 MG tablet Take 1 tablet by mouth in the morning and at bedtime.     MAGNESIUM CITRATE PO Take 1 capsule by mouth in the morning and at bedtime.     metFORMIN (GLUCOPHAGE-XR) 500 MG 24 hr tablet TAKE 1 TABLET BY MOUTH EVERY DAY WITH BREAKFAST 90 tablet 1  Multiple Vitamin (MULTIVITAMIN) capsule Take 1 capsule by mouth daily.     mupirocin ointment (BACTROBAN) 2 % Apply 1 application topically 2 (two) times daily. 30 g 2   naproxen (NAPROSYN) 250 MG tablet PLEASE SEE ATTACHED FOR DETAILED DIRECTIONS     ONETOUCH DELICA LANCETS 85U MISC Use to check blood sugars once daily. 100 each 1   ONETOUCH VERIO test strip USE TO CHECK SUGARS ONCE OR TWICE DAILY . 100 strip 1   Turmeric 500 MG TABS Take 1 tablet by mouth daily.     vitamin C (ASCORBIC ACID) 500 MG tablet Take 500 mg by mouth daily.     amLODipine (NORVASC) 5 MG tablet Take 1  tablet (5 mg total) by mouth daily. 90 tablet 3   atorvastatin (LIPITOR) 20 MG tablet Take 1 tablet (20 mg total) by mouth daily. 90 tablet 3   furosemide (LASIX) 20 MG tablet Take 0.5 tablets (10 mg total) by mouth daily. 90 tablet 3   hydrochlorothiazide (HYDRODIURIL) 25 MG tablet Take 1 tablet (25 mg total) by mouth daily. 90 tablet 3   No current facility-administered medications for this visit.     Allergies:   Losartan, Lisinopril, Metformin and related, Sulfonamide derivatives, and Augmentin [amoxicillin-pot clavulanate]   Social History:  The patient  reports that he quit smoking about 58 years ago. His smoking use included cigarettes. He has a 2.00 pack-year smoking history. He has never used smokeless tobacco. He reports current alcohol use. He reports that he does not use drugs.   Family History:   family history includes Cancer in his mother; Crohn's disease in his brother; Heart attack (age of onset: 56) in his father; Heart disease in his brother, father, and paternal grandfather; Hyperlipidemia in his son; Hypertension in his son; Sleep apnea in his son; Thyroid disease in his son.    Review of Systems: Review of Systems  Constitutional: Negative.   HENT: Negative.    Respiratory: Negative.    Cardiovascular: Negative.   Gastrointestinal: Negative.   Musculoskeletal: Negative.   Neurological: Negative.   Psychiatric/Behavioral: Negative.    All other systems reviewed and are negative.   PHYSICAL EXAM: VS:  BP 130/64 (BP Location: Left Arm, Patient Position: Sitting, Cuff Size: Normal)   Pulse (!) 46   Ht 5' 11"  (1.803 m)   Wt 215 lb 2 oz (97.6 kg)   SpO2 96%   BMI 30.00 kg/m  , BMI Body mass index is 30 kg/m. Constitutional:  oriented to person, place, and time. No distress.  HENT:  Head: Grossly normal Eyes:  no discharge. No scleral icterus.  Neck: No JVD, no carotid bruits  Cardiovascular: Regular rate and rhythm, no murmurs appreciated Pulmonary/Chest:  Clear to auscultation bilaterally, no wheezes or rails Abdominal: Soft.  no distension.  no tenderness.  Musculoskeletal: Normal range of motion Neurological:  normal muscle tone. Coordination normal. No atrophy Skin: Skin warm and dry Psychiatric: normal affect, pleasant   Recent Labs: 05/10/2022: ALT 31; BUN 20; Creatinine, Ser 1.05; Potassium 4.4; Sodium 140    Lipid Panel Lab Results  Component Value Date   CHOL 105 05/10/2022   HDL 44.70 05/10/2022   LDLCALC 49 05/10/2022   TRIG 58.0 05/10/2022      Wt Readings from Last 3 Encounters:  08/06/22 215 lb 2 oz (97.6 kg)  05/10/22 212 lb 3.2 oz (96.3 kg)  11/09/21 207 lb 6.4 oz (94.1 kg)      ASSESSMENT AND PLAN:  Essential hypertension -  Plan: EKG 12-Lead Blood pressure is well controlled on today's visit. No changes made to the medications.  Hyperlipemia Cholesterol is at goal on the current lipid regimen. No changes to the medications were made. aortic atherosclerosis on x-ray  Controlled type 2 diabetes mellitus without complication, without long-term current use of insulin (HCC)   A1c trending upwards, recently changed diet Continue lifestyle modification, plays lots of golf Recommend he avoid sugary drinks, high carbs  Bradycardia -  Asymptomatic bradycardia No further work-up Discussed rate is 46, suggested he call for any orthostasis/near syncope symptoms  Aortic atherosclerosis Noted on x-ray cholesterol at goal,  non-smoker He is working on sugars  Chronic low back pain Stable   Total encounter time more than 30 minutes  Greater than 50% was spent in counseling and coordination of care with the patient      Orders Placed This Encounter  Procedures   EKG 12-Lead     Signed, Esmond Plants, M.D., Ph.D. 08/06/2022  [place], [place]

## 2022-08-06 ENCOUNTER — Encounter: Payer: Self-pay | Admitting: Cardiovascular Disease

## 2022-08-06 ENCOUNTER — Ambulatory Visit: Payer: Medicare HMO | Attending: Cardiovascular Disease | Admitting: Cardiovascular Disease

## 2022-08-06 VITALS — BP 130/64 | HR 46 | Ht 71.0 in | Wt 215.1 lb

## 2022-08-06 DIAGNOSIS — E785 Hyperlipidemia, unspecified: Secondary | ICD-10-CM

## 2022-08-06 DIAGNOSIS — E1169 Type 2 diabetes mellitus with other specified complication: Secondary | ICD-10-CM

## 2022-08-06 DIAGNOSIS — R001 Bradycardia, unspecified: Secondary | ICD-10-CM | POA: Diagnosis not present

## 2022-08-06 DIAGNOSIS — I10 Essential (primary) hypertension: Secondary | ICD-10-CM | POA: Diagnosis not present

## 2022-08-06 DIAGNOSIS — I700 Atherosclerosis of aorta: Secondary | ICD-10-CM | POA: Diagnosis not present

## 2022-08-06 MED ORDER — HYDROCHLOROTHIAZIDE 25 MG PO TABS: 25.0000 mg | ORAL_TABLET | Freq: Every day | ORAL | 3 refills | Status: DC

## 2022-08-06 MED ORDER — ATORVASTATIN CALCIUM 20 MG PO TABS: 20.0000 mg | ORAL_TABLET | Freq: Every day | ORAL | 3 refills | Status: DC

## 2022-08-06 MED ORDER — FUROSEMIDE 20 MG PO TABS: 10.0000 mg | ORAL_TABLET | Freq: Every day | ORAL | 3 refills | Status: DC

## 2022-08-06 MED ORDER — AMLODIPINE BESYLATE 5 MG PO TABS: 5.0000 mg | ORAL_TABLET | Freq: Every day | ORAL | 3 refills | Status: DC

## 2022-08-06 NOTE — Patient Instructions (Signed)
Medication Instructions:  No changes  If you need a refill on your cardiac medications before your next appointment, please call your pharmacy.   Lab work: No new labs needed  Testing/Procedures: No new testing needed  Follow-Up: At [facility], you and your health needs are our priority.  As part of our continuing mission to provide you with exceptional heart care, we have created designated Provider Care Teams.  These Care Teams include your primary Cardiologist (physician) and Advanced Practice Providers (APPs -  Physician Assistants and Nurse Practitioners) who all work together to provide you with the care you need, when you need it.  You will need a follow up appointment in 12 months  Providers on your designated Care Team:   Christopher Berge, NP Ryan Dunn, PA-C Cadence Furth, PA-C  COVID-19 Vaccine Information can be found at: [email or web address] For questions related to vaccine distribution or appointments, please email [email or web address] or call [phone].   

## 2022-08-27 DIAGNOSIS — M531 Cervicobrachial syndrome: Secondary | ICD-10-CM | POA: Diagnosis not present

## 2022-08-27 DIAGNOSIS — M9903 Segmental and somatic dysfunction of lumbar region: Secondary | ICD-10-CM | POA: Diagnosis not present

## 2022-08-27 DIAGNOSIS — M5441 Lumbago with sciatica, right side: Secondary | ICD-10-CM | POA: Diagnosis not present

## 2022-08-27 DIAGNOSIS — M9905 Segmental and somatic dysfunction of pelvic region: Secondary | ICD-10-CM | POA: Diagnosis not present

## 2022-08-27 DIAGNOSIS — M9901 Segmental and somatic dysfunction of cervical region: Secondary | ICD-10-CM | POA: Diagnosis not present

## 2022-08-27 DIAGNOSIS — M955 Acquired deformity of pelvis: Secondary | ICD-10-CM | POA: Diagnosis not present

## 2022-09-24 DIAGNOSIS — M531 Cervicobrachial syndrome: Secondary | ICD-10-CM | POA: Diagnosis not present

## 2022-09-24 DIAGNOSIS — M9901 Segmental and somatic dysfunction of cervical region: Secondary | ICD-10-CM | POA: Diagnosis not present

## 2022-09-24 DIAGNOSIS — M9903 Segmental and somatic dysfunction of lumbar region: Secondary | ICD-10-CM | POA: Diagnosis not present

## 2022-09-24 DIAGNOSIS — M955 Acquired deformity of pelvis: Secondary | ICD-10-CM | POA: Diagnosis not present

## 2022-09-24 DIAGNOSIS — M9905 Segmental and somatic dysfunction of pelvic region: Secondary | ICD-10-CM | POA: Diagnosis not present

## 2022-09-24 DIAGNOSIS — M5441 Lumbago with sciatica, right side: Secondary | ICD-10-CM | POA: Diagnosis not present

## 2022-10-24 DIAGNOSIS — Z20828 Contact with and (suspected) exposure to other viral communicable diseases: Secondary | ICD-10-CM | POA: Diagnosis not present

## 2022-10-29 DIAGNOSIS — M9905 Segmental and somatic dysfunction of pelvic region: Secondary | ICD-10-CM | POA: Diagnosis not present

## 2022-10-29 DIAGNOSIS — M955 Acquired deformity of pelvis: Secondary | ICD-10-CM | POA: Diagnosis not present

## 2022-10-29 DIAGNOSIS — M531 Cervicobrachial syndrome: Secondary | ICD-10-CM | POA: Diagnosis not present

## 2022-10-29 DIAGNOSIS — M5441 Lumbago with sciatica, right side: Secondary | ICD-10-CM | POA: Diagnosis not present

## 2022-10-29 DIAGNOSIS — M9903 Segmental and somatic dysfunction of lumbar region: Secondary | ICD-10-CM | POA: Diagnosis not present

## 2022-10-29 DIAGNOSIS — M9901 Segmental and somatic dysfunction of cervical region: Secondary | ICD-10-CM | POA: Diagnosis not present

## 2022-11-11 ENCOUNTER — Ambulatory Visit (INDEPENDENT_AMBULATORY_CARE_PROVIDER_SITE_OTHER): Payer: Medicare HMO | Admitting: Internal Medicine

## 2022-11-11 ENCOUNTER — Encounter: Payer: Self-pay | Admitting: Internal Medicine

## 2022-11-11 VITALS — BP 128/70 | HR 55 | Temp 98.1°F | Ht 70.0 in | Wt 209.8 lb

## 2022-11-11 DIAGNOSIS — I10 Essential (primary) hypertension: Secondary | ICD-10-CM

## 2022-11-11 DIAGNOSIS — I700 Atherosclerosis of aorta: Secondary | ICD-10-CM | POA: Diagnosis not present

## 2022-11-11 DIAGNOSIS — R001 Bradycardia, unspecified: Secondary | ICD-10-CM | POA: Diagnosis not present

## 2022-11-11 DIAGNOSIS — E785 Hyperlipidemia, unspecified: Secondary | ICD-10-CM | POA: Diagnosis not present

## 2022-11-11 DIAGNOSIS — Z23 Encounter for immunization: Secondary | ICD-10-CM | POA: Diagnosis not present

## 2022-11-11 DIAGNOSIS — E1169 Type 2 diabetes mellitus with other specified complication: Secondary | ICD-10-CM | POA: Diagnosis not present

## 2022-11-11 LAB — COMPREHENSIVE METABOLIC PANEL
ALT: 33 U/L (ref 0–53)
AST: 24 U/L (ref 0–37)
Albumin: 4.3 g/dL (ref 3.5–5.2)
Alkaline Phosphatase: 57 U/L (ref 39–117)
BUN: 16 mg/dL (ref 6–23)
CO2: 30 mEq/L (ref 19–32)
Calcium: 9.1 mg/dL (ref 8.4–10.5)
Chloride: 105 mEq/L (ref 96–112)
Creatinine, Ser: 0.95 mg/dL (ref 0.40–1.50)
GFR: 76.51 mL/min (ref >60.00)
Glucose, Bld: 130 mg/dL — ABNORMAL HIGH (ref 70–99)
Potassium: 4.2 mEq/L (ref 3.5–5.1)
Sodium: 141 mEq/L (ref 135–145)
Total Bilirubin: 0.8 mg/dL (ref 0.2–1.2)
Total Protein: 6.5 g/dL (ref 6.0–8.3)

## 2022-11-11 LAB — LIPID PANEL
Cholesterol: 106 mg/dL (ref 0–200)
HDL: 45.5 mg/dL (ref >39.00)
LDL Cholesterol: 45 mg/dL (ref 0–99)
NonHDL: 60.09
Total CHOL/HDL Ratio: 2
Triglycerides: 76 mg/dL (ref 0.0–149.0)
VLDL: 15.2 mg/dL (ref 0.0–40.0)

## 2022-11-11 LAB — TSH: TSH: 2.46 u[IU]/mL (ref 0.35–5.50)

## 2022-11-11 LAB — HEMOGLOBIN A1C: Hgb A1c MFr Bld: 6.9 % — ABNORMAL HIGH (ref 4.6–6.5)

## 2022-11-11 MED ORDER — GLIPIZIDE 5 MG PO TABS: 5.0000 mg | ORAL_TABLET | Freq: Two times a day (BID) | ORAL | 0 refills | Status: DC

## 2022-11-11 NOTE — Progress Notes (Signed)
Subjective:  Patient ID: Richard Hardy, male    DOB: 28-Jan-1946  Age: 76 y.o. MRN: [id number]  CC: The primary encounter diagnosis was Hyperlipidemia due to type 2 diabetes mellitus ([place]). Diagnoses of Bradycardia, Primary hypertension, Abdominal aortic atherosclerosis ([place]), and Need for immunization against influenza were also pertinent to this visit.   HPI [name] presents for follow up on type 2 DM, hypertension and aortic atherosclerosis   1) T2DM : has been checking sugars 2-3 times daily for the last 5 days. Fastings have been 148 after bedtime CBG of 92 . Takig 2.5 mg glipizide bid. Fasting was 160 after an evening of chicken noodle soup and no exercise.  Usually exercises daily but noted higher sugars after skipping exercise.   2) HTN: Patient is taking amlodipine and htcz  as prescribed and notes no adverse effects.  Home BP readings have not been done a .  he is avoiding added salt in her diet and  exercising regularly on the average of 7 days per week  .     Outpatient Medications Prior to Visit  Medication Sig Dispense Refill   amLODipine (NORVASC) 5 MG tablet Take 1 tablet (5 mg total) by mouth daily. 90 tablet 3   atorvastatin (LIPITOR) 20 MG tablet Take 1 tablet (20 mg total) by mouth daily. 90 tablet 3   Blood Glucose Monitoring Suppl (ONETOUCH VERIO IQ SYSTEM) w/Device KIT Use to check blood sugars once daily. 1 kit 0   chlorhexidine (PERIDEX) 0.12 % solution SMARTSIG:0.5 Ounce(s) By Mouth Twice Daily     CINNAMON PO Take 2 capsules by mouth daily.     Coenzyme Q10 (COQ10) 200 MG CAPS Take 1 tablet by mouth daily.     Fish Oil OIL Take by mouth daily.     fluticasone (FLONASE) 50 MCG/ACT nasal spray Place into the nose at bedtime.     furosemide (LASIX) 20 MG tablet Take 0.5 tablets (10 mg total) by mouth daily. 90 tablet 3   glucosamine-chondroitin 500-400 MG tablet Take 1 tablet by mouth in the morning and at bedtime.     hydrochlorothiazide (HYDRODIURIL) 25 MG  tablet Take 1 tablet (25 mg total) by mouth daily. 90 tablet 3   MAGNESIUM CITRATE PO Take 1 capsule by mouth in the morning and at bedtime.     metFORMIN (GLUCOPHAGE-XR) 500 MG 24 hr tablet TAKE 1 TABLET BY MOUTH EVERY DAY WITH BREAKFAST 90 tablet 1   Multiple Vitamin (MULTIVITAMIN) capsule Take 1 capsule by mouth daily.     mupirocin ointment (BACTROBAN) 2 % Apply 1 application topically 2 (two) times daily. 30 g 2   naproxen (NAPROSYN) 250 MG tablet PLEASE SEE ATTACHED FOR DETAILED DIRECTIONS     ONETOUCH DELICA LANCETS 78H MISC Use to check blood sugars once daily. 100 each 1   ONETOUCH VERIO test strip USE TO CHECK SUGARS ONCE OR TWICE DAILY . 100 strip 1   Turmeric 500 MG TABS Take 1 tablet by mouth daily.     vitamin C (ASCORBIC ACID) 500 MG tablet Take 500 mg by mouth daily.     glipiZIDE (GLUCOTROL) 5 MG tablet TAKE 1 TABLET (5 MG TOTAL) BY MOUTH 2 (TWO) TIMES DAILY BEFORE A MEAL. 180 tablet 1   No facility-administered medications prior to visit.    Review of Systems;  Patient denies headache, fevers, malaise, unintentional weight loss, skin rash, eye pain, sinus congestion and sinus pain, sore throat, dysphagia,  hemoptysis , cough, dyspnea,  wheezing, chest pain, palpitations, orthopnea, edema, abdominal pain, nausea, melena, diarrhea, constipation, flank pain, dysuria, hematuria, urinary  Frequency, nocturia, numbness, tingling, seizures,  Focal weakness, Loss of consciousness,  Tremor, insomnia, depression, anxiety, and suicidal ideation.      Objective:  BP 128/70   Pulse (!) 55   Temp 98.1 F (36.7 C) (Oral)   Ht _0  (1.778 m)   Wt 209 lb 12.8 oz (95.2 kg)   SpO2 95%   BMI 30.10 kg/m   BP Readings from Last 3 Encounters:  11/11/22 128/70  08/06/22 130/64  05/10/22 130/76    Wt Readings from Last 3 Encounters:  11/11/22 209 lb 12.8 oz (95.2 kg)  08/06/22 215 lb 2 oz (97.6 kg)  05/10/22 212 lb 3.2 oz (96.3 kg)    General appearance: alert, cooperative and  appears stated age Ears: normal TM's and external ear canals both ears Throat: lips, mucosa, and tongue normal; teeth and gums normal Neck: no adenopathy, no carotid bruit, supple, symmetrical, trachea midline and thyroid not enlarged, symmetric, no tenderness/mass/nodules Back: symmetric, no curvature. ROM normal. No CVA tenderness. Lungs: clear to auscultation bilaterally Heart: regular rate and rhythm, S1, S2 normal, no murmur, click, rub or gallop Abdomen: soft, non-tender; bowel sounds normal; no masses,  no organomegaly Pulses: 2+ and symmetric Skin: Skin color, texture, turgor normal. No rashes or lesions Lymph nodes: Cervical, supraclavicular, and axillary nodes normal. Neuro:  awake and interactive with normal mood and affect. Higher cortical functions are normal. Speech is clear without word-finding difficulty or dysarthria. Extraocular movements are intact. Visual fields of both eyes are grossly intact. Sensation to light touch is grossly intact bilaterally of upper and lower extremities. Motor examination shows 4+/5 symmetric hand grip and upper extremity and 5/5 lower extremity strength. There is no pronation or drift. Gait is non-ataxic   Lab Results  Component Value Date   HGBA1C 7.0 (H) 05/10/2022   HGBA1C 6.6 (H) 11/09/2021   HGBA1C 6.3 05/10/2021    Lab Results  Component Value Date   CREATININE 1.05 05/10/2022   CREATININE 0.96 11/09/2021   CREATININE 1.03 05/10/2021    Lab Results  Component Value Date   WBC 8.0 07/12/2019   HGB 14.9 07/12/2019   HCT 43.1 07/12/2019   PLT 162 07/12/2019   GLUCOSE 134 (H) 05/10/2022   CHOL 105 05/10/2022   TRIG 58.0 05/10/2022   HDL 44.70 05/10/2022   LDLDIRECT 55.0 05/10/2022   LDLCALC 49 05/10/2022   ALT 31 05/10/2022   AST 22 05/10/2022   NA 140 05/10/2022   K 4.4 05/10/2022   CL 104 05/10/2022   CREATININE 1.05 05/10/2022   BUN 20 05/10/2022   CO2 31 05/10/2022   TSH 2.320 07/09/2019   PSA 0.32 11/10/2019    HGBA1C 7.0 (H) 05/10/2022   MICROALBUR 1.1 05/10/2022    No results found.  Assessment & Plan:   Problem List Items Addressed This Visit     Hypertension    Well controlled on current regimen of amlodipine and hctz . Renal function stable, no changes today.       Hyperlipidemia due to type 2 diabetes mellitus ([place]) - Primary    He is managng diabetes with metformin and glipizide at the lowest dose,  2.5 mg bid (1/2 tablet) . His high morning sugars may be due to nocturnal lows.  Advised to check a 3 am sugar if bedtime sugar is < 100/   Tolerating atorvastatin  .  No history of  proteinuria.  Eye exams are done annually by [facility] in February   .   Lab Results  Component Value Date   HGBA1C 7.0 (H) 05/10/2022   Lab Results  Component Value Date   MICROALBUR 1.1 05/10/2022   MICROALBUR 1.2 05/10/2021   Lab Results  Component Value Date   LDLCALC 49 05/10/2022         Relevant Medications   glipiZIDE (GLUCOTROL) 5 MG tablet   Other Relevant Orders   Hemoglobin A1c   Comprehensive metabolic panel   Lipid panel   Bradycardia   Relevant Orders   TSH   Abdominal aortic atherosclerosis ([place])    We reviewed findings of prior CT scan  again and the prognostic implications.   Patient is tolerating high potency statin therapy       Other Visit Diagnoses     Need for immunization against influenza       Relevant Orders   Flu Vaccine QUAD High Dose(Fluad) (Completed)       Follow-up: Return in about 6 months (around 05/13/2023) for follow up diabetes.   Crecencio Mc, MD

## 2022-11-11 NOTE — Assessment & Plan Note (Signed)
We reviewed findings of prior CT scan  again and the prognostic implications.   Patient is tolerating high potency statin therapy

## 2022-11-11 NOTE — Patient Instructions (Addendum)
High mornings may be a reaction to a low blood sugar in the middle of the night,  Please Check your sugar at 2 -3 am the next time your bedtime sugar is < 100. (When you wake up to void)  Exercise is very beneficial to lowering blood sugars  so make sure you exercise on the days you "indulge"

## 2022-11-11 NOTE — Assessment & Plan Note (Signed)
Well controlled on current regimen of amlodipine and hctz . Renal function stable, no changes today.

## 2022-11-11 NOTE — Assessment & Plan Note (Addendum)
He is managng diabetes with metformin and glipizide at the lowest dose,  2.5 mg bid (1/2 tablet) . His high morning sugars may be due to nocturnal lows.  Advised to check a 3 am sugar if bedtime sugar is < 100/   Tolerating atorvastatin  .  No history of proteinuria.  Eye exams are done annually by [facility] in February   .   Lab Results  Component Value Date   HGBA1C 7.0 (H) 05/10/2022   Lab Results  Component Value Date   MICROALBUR 1.1 05/10/2022   MICROALBUR 1.2 05/10/2021    Lab Results  Component Value Date   LDLCALC 49 05/10/2022

## 2022-11-20 NOTE — Telephone Encounter (Signed)
Error

## 2022-11-26 DIAGNOSIS — M531 Cervicobrachial syndrome: Secondary | ICD-10-CM | POA: Diagnosis not present

## 2022-11-26 DIAGNOSIS — M9901 Segmental and somatic dysfunction of cervical region: Secondary | ICD-10-CM | POA: Diagnosis not present

## 2022-11-26 DIAGNOSIS — M5441 Lumbago with sciatica, right side: Secondary | ICD-10-CM | POA: Diagnosis not present

## 2022-11-26 DIAGNOSIS — M9903 Segmental and somatic dysfunction of lumbar region: Secondary | ICD-10-CM | POA: Diagnosis not present

## 2022-11-26 DIAGNOSIS — M955 Acquired deformity of pelvis: Secondary | ICD-10-CM | POA: Diagnosis not present

## 2022-11-26 DIAGNOSIS — M9905 Segmental and somatic dysfunction of pelvic region: Secondary | ICD-10-CM | POA: Diagnosis not present

## 2022-12-12 ENCOUNTER — Telehealth (INDEPENDENT_AMBULATORY_CARE_PROVIDER_SITE_OTHER): Payer: Medicare HMO | Admitting: Nurse Practitioner

## 2022-12-12 ENCOUNTER — Telehealth: Payer: Medicare HMO | Admitting: Family Medicine

## 2022-12-12 VITALS — Temp 101.0°F

## 2022-12-12 DIAGNOSIS — U071 COVID-19: Secondary | ICD-10-CM | POA: Diagnosis not present

## 2022-12-12 MED ORDER — NIRMATRELVIR/RITONAVIR (PAXLOVID)TABLET: 3.0000 | ORAL_TABLET | Freq: Two times a day (BID) | ORAL | 0 refills | Status: AC

## 2022-12-12 NOTE — Progress Notes (Addendum)
   Established Patient Office Visit  An audio-only tele-health visit was completed today for this patient. I connected with  Richard Hardy on 12/12/22 utilizing audio-only technology and verified that I am speaking with the correct person using two identifiers. The patient was located at their home, and I was located at the office of [place] [facility] during the encounter. I discussed the limitations of evaluation and management by telemedicine. The patient expressed understanding and agreed to proceed.     Subjective   Patient ID: Richard Hardy, male    DOB: [birth year]  Age: 77 y.o. MRN: [id number]  Chief Complaint  Patient presents with   Covid Positive    Symptom onset 5 days ago.  Tested positive for COVID at home.  Has history of type 2 diabetes older than 79 years of age.  Has been vaccinated x 2.  Last GFR 1 month ago 76.5.    Review of Systems  Constitutional:  Positive for fever and malaise/fatigue.  Respiratory:  Positive for sputum production (clear-yellow). Negative for cough and shortness of breath.   Cardiovascular:  Negative for chest pain.  Gastrointestinal:  Positive for diarrhea and nausea. Negative for vomiting.      Objective:     Temp (!) 101 F (38.3 C)    Physical Exam Comprehensive physical exam not completed today as office visit was conducted remotely.  Appears fairly well over video, does cough a couple of times but no signs of respiratory distress noted..  Patient was alert and oriented, and appeared to have appropriate judgment.   No results found for any visits on 12/12/22.    The ASCVD Risk score (Arnett DK, et al., 2019) failed to calculate for the following reasons:   The valid total cholesterol range is 130 to 320 mg/dL    Assessment & Plan:   Problem List Items Addressed This Visit       Other   COVID-19 - Primary    Acute, does fall in risk category of high risk of severe disease based on age and history of type  2 diabetes.  Would recommend a course of Paxlovid, patient told to monitor blood pressure closely and if he notices dizziness or systolic blood pressure less than 116 or diastolic blood pressure less than 60 while taking Paxlovid to call his primary care provider as this may interact with his amlodipine.  Told to hold his atorvastatin while taking Paxlovid.  Also encouraged rest, hydration, walking in home a few times a day as tolerated to promote circulation and lung expansion. Patient educated on current isolation guidelines as recommended by CDC.  Patient also educated on signs and symptoms of severe illness and when to call 911.  Patient reported understanding.      Relevant Medications   nirmatrelvir/ritonavir (PAXLOVID) 20 x 150 MG & 10 x '100MG'$  TABS    Return if symptoms worsen or fail to improve.    Ailene Ards, NP

## 2022-12-12 NOTE — Assessment & Plan Note (Addendum)
Acute, does fall in risk category of high risk of severe disease based on age and history of type 2 diabetes.  Would recommend a course of Paxlovid, patient told to monitor blood pressure closely and if he notices dizziness or systolic blood pressure less than 081 or diastolic blood pressure less than 60 while taking Paxlovid to call his primary care provider as this may interact with his amlodipine.  Told to hold his atorvastatin while taking Paxlovid.  Also encouraged rest, hydration, walking in home a few times a day as tolerated to promote circulation and lung expansion. Patient educated on current isolation guidelines as recommended by CDC.  Patient also educated on signs and symptoms of severe illness and when to call 911.  Patient reported understanding.

## 2022-12-14 ENCOUNTER — Encounter: Payer: Self-pay | Admitting: Emergency Medicine

## 2022-12-14 ENCOUNTER — Inpatient Hospital Stay
Admission: EM | Admit: 2022-12-14 | Discharge: 2022-12-16 | DRG: 640 | Disposition: A | Discharge status: Home / Self Care | Payer: Medicare HMO | Attending: Internal Medicine | Admitting: Internal Medicine

## 2022-12-14 DIAGNOSIS — Z880 Allergy status to penicillin: Secondary | ICD-10-CM | POA: Diagnosis not present

## 2022-12-14 DIAGNOSIS — E785 Hyperlipidemia, unspecified: Secondary | ICD-10-CM | POA: Diagnosis present

## 2022-12-14 DIAGNOSIS — R609 Edema, unspecified: Secondary | ICD-10-CM | POA: Diagnosis not present

## 2022-12-14 DIAGNOSIS — Z8249 Family history of ischemic heart disease and other diseases of the circulatory system: Secondary | ICD-10-CM

## 2022-12-14 DIAGNOSIS — Z79899 Other long term (current) drug therapy: Secondary | ICD-10-CM | POA: Diagnosis not present

## 2022-12-14 DIAGNOSIS — E871 Hypo-osmolality and hyponatremia: Secondary | ICD-10-CM | POA: Diagnosis not present

## 2022-12-14 DIAGNOSIS — I700 Atherosclerosis of aorta: Secondary | ICD-10-CM | POA: Diagnosis present

## 2022-12-14 DIAGNOSIS — G4730 Sleep apnea, unspecified: Secondary | ICD-10-CM | POA: Diagnosis present

## 2022-12-14 DIAGNOSIS — Z85828 Personal history of other malignant neoplasm of skin: Secondary | ICD-10-CM | POA: Diagnosis not present

## 2022-12-14 DIAGNOSIS — R197 Diarrhea, unspecified: Secondary | ICD-10-CM | POA: Diagnosis not present

## 2022-12-14 DIAGNOSIS — Z87891 Personal history of nicotine dependence: Secondary | ICD-10-CM

## 2022-12-14 DIAGNOSIS — G4733 Obstructive sleep apnea (adult) (pediatric): Secondary | ICD-10-CM | POA: Diagnosis present

## 2022-12-14 DIAGNOSIS — E119 Type 2 diabetes mellitus without complications: Secondary | ICD-10-CM | POA: Diagnosis not present

## 2022-12-14 DIAGNOSIS — R58 Hemorrhage, not elsewhere classified: Secondary | ICD-10-CM | POA: Diagnosis not present

## 2022-12-14 DIAGNOSIS — Z882 Allergy status to sulfonamides status: Secondary | ICD-10-CM

## 2022-12-14 DIAGNOSIS — R001 Bradycardia, unspecified: Secondary | ICD-10-CM | POA: Diagnosis present

## 2022-12-14 DIAGNOSIS — Z888 Allergy status to other drugs, medicaments and biological substances status: Secondary | ICD-10-CM

## 2022-12-14 DIAGNOSIS — Z7984 Long term (current) use of oral hypoglycemic drugs: Secondary | ICD-10-CM

## 2022-12-14 DIAGNOSIS — D72818 Other decreased white blood cell count: Secondary | ICD-10-CM | POA: Diagnosis not present

## 2022-12-14 DIAGNOSIS — D6959 Other secondary thrombocytopenia: Secondary | ICD-10-CM | POA: Diagnosis not present

## 2022-12-14 DIAGNOSIS — Z789 Other specified health status: Secondary | ICD-10-CM | POA: Insufficient documentation

## 2022-12-14 DIAGNOSIS — R112 Nausea with vomiting, unspecified: Secondary | ICD-10-CM

## 2022-12-14 DIAGNOSIS — R1111 Vomiting without nausea: Secondary | ICD-10-CM | POA: Diagnosis not present

## 2022-12-14 DIAGNOSIS — D7589 Other specified diseases of blood and blood-forming organs: Secondary | ICD-10-CM | POA: Insufficient documentation

## 2022-12-14 DIAGNOSIS — R7401 Elevation of levels of liver transaminase levels: Secondary | ICD-10-CM | POA: Diagnosis not present

## 2022-12-14 DIAGNOSIS — I10 Essential (primary) hypertension: Secondary | ICD-10-CM | POA: Diagnosis not present

## 2022-12-14 DIAGNOSIS — H8110 Benign paroxysmal vertigo, unspecified ear: Secondary | ICD-10-CM | POA: Diagnosis present

## 2022-12-14 DIAGNOSIS — U071 COVID-19: Secondary | ICD-10-CM | POA: Diagnosis present

## 2022-12-14 DIAGNOSIS — Z808 Family history of malignant neoplasm of other organs or systems: Secondary | ICD-10-CM | POA: Diagnosis not present

## 2022-12-14 DIAGNOSIS — Z743 Need for continuous supervision: Secondary | ICD-10-CM | POA: Diagnosis not present

## 2022-12-14 LAB — COMPREHENSIVE METABOLIC PANEL
ALT: 47 U/L — ABNORMAL HIGH (ref 0–44)
ALT: 42 U/L (ref 0–44)
ALT: 39 U/L (ref 0–44)
AST: 46 U/L — ABNORMAL HIGH (ref 15–41)
AST: 39 U/L (ref 15–41)
AST: 43 U/L — ABNORMAL HIGH (ref 15–41)
Albumin: 3.3 g/dL — ABNORMAL LOW (ref 3.5–5.0)
Albumin: 2.8 g/dL — ABNORMAL LOW (ref 3.5–5.0)
Albumin: 2.8 g/dL — ABNORMAL LOW (ref 3.5–5.0)
Alkaline Phosphatase: 43 U/L (ref 38–126)
Alkaline Phosphatase: 36 U/L — ABNORMAL LOW (ref 38–126)
Alkaline Phosphatase: 36 U/L — ABNORMAL LOW (ref 38–126)
Anion gap: 13 (ref 5–15)
Anion gap: 7 (ref 5–15)
Anion gap: 9 (ref 5–15)
BUN: 20 mg/dL (ref 8–23)
BUN: 17 mg/dL (ref 8–23)
BUN: 16 mg/dL (ref 8–23)
CO2: 20 mmol/L — ABNORMAL LOW (ref 22–32)
CO2: 21 mmol/L — ABNORMAL LOW (ref 22–32)
CO2: 23 mmol/L (ref 22–32)
Calcium: 7.7 mg/dL — ABNORMAL LOW (ref 8.9–10.3)
Calcium: 7 mg/dL — ABNORMAL LOW (ref 8.9–10.3)
Calcium: 7.4 mg/dL — ABNORMAL LOW (ref 8.9–10.3)
Chloride: 85 mmol/L — ABNORMAL LOW (ref 98–111)
Chloride: 90 mmol/L — ABNORMAL LOW (ref 98–111)
Chloride: 91 mmol/L — ABNORMAL LOW (ref 98–111)
Creatinine, Ser: 0.75 mg/dL (ref 0.61–1.24)
Creatinine, Ser: 0.71 mg/dL (ref 0.61–1.24)
Creatinine, Ser: 0.75 mg/dL (ref 0.61–1.24)
GFR, Estimated: >60 mL/min (ref >60)
GFR, Estimated: >60 mL/min (ref >60)
GFR, Estimated: >60 mL/min (ref >60)
Glucose, Bld: 193 mg/dL — ABNORMAL HIGH (ref 70–99)
Glucose, Bld: 183 mg/dL — ABNORMAL HIGH (ref 70–99)
Glucose, Bld: 138 mg/dL — ABNORMAL HIGH (ref 70–99)
Potassium: 3.5 mmol/L (ref 3.5–5.1)
Potassium: 3.3 mmol/L — ABNORMAL LOW (ref 3.5–5.1)
Potassium: 3.4 mmol/L — ABNORMAL LOW (ref 3.5–5.1)
Sodium: 118 mmol/L — CL (ref 135–145)
Sodium: 118 mmol/L — CL (ref 135–145)
Sodium: 123 mmol/L — ABNORMAL LOW (ref 135–145)
Total Bilirubin: 1.2 mg/dL (ref 0.3–1.2)
Total Bilirubin: 0.9 mg/dL (ref 0.3–1.2)
Total Bilirubin: 0.8 mg/dL (ref 0.3–1.2)
Total Protein: 6.3 g/dL — ABNORMAL LOW (ref 6.5–8.1)
Total Protein: 5.4 g/dL — ABNORMAL LOW (ref 6.5–8.1)
Total Protein: 5.4 g/dL — ABNORMAL LOW (ref 6.5–8.1)

## 2022-12-14 LAB — URINALYSIS, ROUTINE W REFLEX MICROSCOPIC
Bacteria, UA: NONE SEEN
Bilirubin Urine: NEGATIVE
Glucose, UA: >=500 mg/dL — AB
Hgb urine dipstick: NEGATIVE
Ketones, ur: 20 mg/dL — AB
Leukocytes,Ua: NEGATIVE
Nitrite: NEGATIVE
Protein, ur: 30 mg/dL — AB
Specific Gravity, Urine: 1.021 (ref 1.005–1.030)
pH: 5 (ref 5.0–8.0)

## 2022-12-14 LAB — CBC
HCT: 38.3 % — ABNORMAL LOW (ref 39.0–52.0)
Hemoglobin: 14.1 g/dL (ref 13.0–17.0)
MCH: 32.3 pg (ref 26.0–34.0)
MCHC: 36.8 g/dL — ABNORMAL HIGH (ref 30.0–36.0)
MCV: 87.8 fL (ref 80.0–100.0)
Platelets: 82 10*3/uL — ABNORMAL LOW (ref 150–400)
RBC: 4.36 MIL/uL (ref 4.22–5.81)
RDW: 11.6 % (ref 11.5–15.5)
WBC: 2.4 10*3/uL — ABNORMAL LOW (ref 4.0–10.5)
nRBC: 0 % (ref 0.0–0.2)

## 2022-12-14 LAB — CBG MONITORING, ED
Glucose-Capillary: 166 mg/dL — ABNORMAL HIGH (ref 70–99)
Glucose-Capillary: 143 mg/dL — ABNORMAL HIGH (ref 70–99)
Glucose-Capillary: 146 mg/dL — ABNORMAL HIGH (ref 70–99)

## 2022-12-14 LAB — MAGNESIUM: Magnesium: 1.6 mg/dL — ABNORMAL LOW (ref 1.7–2.4)

## 2022-12-14 LAB — SODIUM: Sodium: 125 mmol/L — ABNORMAL LOW (ref 135–145)

## 2022-12-14 LAB — SODIUM, URINE, RANDOM: Sodium, Ur: 49 mmol/L

## 2022-12-14 LAB — OSMOLALITY, URINE: Osmolality, Ur: 770 mOsm/kg (ref 300–900)

## 2022-12-14 LAB — OSMOLALITY: Osmolality: 257 mOsm/kg — ABNORMAL LOW (ref 275–295)

## 2022-12-14 LAB — LIPASE, BLOOD: Lipase: 27 U/L (ref 11–51)

## 2022-12-14 MED ORDER — ONDANSETRON HCL 4 MG/2ML IJ SOLN
4.0000 mg | Freq: Once | INTRAMUSCULAR | Status: AC
  Administered 2022-12-14: 4 mg via INTRAVENOUS
  Filled 2022-12-14: qty 2

## 2022-12-14 MED ORDER — LOPERAMIDE HCL 2 MG PO CAPS
2.0000 mg | ORAL_CAPSULE | Freq: Once | ORAL | Status: AC
  Administered 2022-12-14: 2 mg via ORAL
  Filled 2022-12-14: qty 1

## 2022-12-14 MED ORDER — ONDANSETRON HCL 4 MG/2ML IJ SOLN
4.0000 mg | Freq: Four times a day (QID) | INTRAMUSCULAR | Status: DC | PRN
  Administered 2022-12-14: 4 mg via INTRAVENOUS
  Filled 2022-12-14: qty 2

## 2022-12-14 MED ORDER — ENOXAPARIN SODIUM 40 MG/0.4ML IJ SOSY: 40.0000 mg | PREFILLED_SYRINGE | INTRAMUSCULAR | Status: DC

## 2022-12-14 MED ORDER — ACETAMINOPHEN 650 MG RE SUPP: 650.0000 mg | Freq: Four times a day (QID) | RECTAL | Status: DC | PRN

## 2022-12-14 MED ORDER — MAGNESIUM SULFATE IN D5W 1-5 GM/100ML-% IV SOLN
1.0000 g | Freq: Once | INTRAVENOUS | Status: AC
  Administered 2022-12-14: 1 g via INTRAVENOUS
  Filled 2022-12-14: qty 100

## 2022-12-14 MED ORDER — ONDANSETRON HCL 4 MG PO TABS: 4.0000 mg | ORAL_TABLET | Freq: Four times a day (QID) | ORAL | Status: DC | PRN

## 2022-12-14 MED ORDER — SODIUM CHLORIDE 1 G PO TABS
1.0000 g | ORAL_TABLET | Freq: Three times a day (TID) | ORAL | Status: DC
  Filled 2022-12-14: qty 1

## 2022-12-14 MED ORDER — TRAZODONE HCL 50 MG PO TABS: 50.0000 mg | ORAL_TABLET | Freq: Every evening | ORAL | Status: DC | PRN

## 2022-12-14 MED ORDER — SODIUM CHLORIDE 0.9 % IV BOLUS (SEPSIS)
1000.0000 mL | Freq: Once | INTRAVENOUS | Status: AC
  Administered 2022-12-14: 1000 mL via INTRAVENOUS

## 2022-12-14 MED ORDER — SODIUM CHLORIDE 3 % IV BOLUS: 250.0000 mL | Freq: Once | INTRAVENOUS | Status: DC

## 2022-12-14 MED ORDER — SODIUM CHLORIDE 3 % IV SOLN
INTRAVENOUS | Status: DC
  Filled 2022-12-14: qty 500

## 2022-12-14 MED ORDER — ONDANSETRON HCL 4 MG/2ML IJ SOLN
4.0000 mg | Freq: Once | INTRAMUSCULAR | Status: AC | PRN
  Administered 2022-12-14: 4 mg via INTRAVENOUS
  Filled 2022-12-14: qty 2

## 2022-12-14 MED ORDER — INSULIN ASPART 100 UNIT/ML IJ SOLN
0.0000 [IU] | Freq: Three times a day (TID) | INTRAMUSCULAR | Status: DC
  Administered 2022-12-14: 1 [IU] via SUBCUTANEOUS
  Administered 2022-12-14: 2 [IU] via SUBCUTANEOUS
  Administered 2022-12-15 (×3): 1 [IU] via SUBCUTANEOUS
  Filled 2022-12-14 (×5): qty 1

## 2022-12-14 MED ORDER — ACETAMINOPHEN 325 MG PO TABS: 650.0000 mg | ORAL_TABLET | Freq: Four times a day (QID) | ORAL | Status: DC | PRN

## 2022-12-14 MED ORDER — SODIUM CHLORIDE 0.9 % IV SOLN: INTRAVENOUS | Status: DC

## 2022-12-14 MED ORDER — ENOXAPARIN SODIUM 60 MG/0.6ML IJ SOSY
0.5000 mg/kg | PREFILLED_SYRINGE | INTRAMUSCULAR | Status: DC
  Administered 2022-12-14 – 2022-12-16 (×3): 47.5 mg via SUBCUTANEOUS
  Filled 2022-12-14 (×3): qty 0.6

## 2022-12-14 MED ORDER — POLYETHYLENE GLYCOL 3350 17 G PO PACK: 17.0000 g | PACK | Freq: Every day | ORAL | Status: DC | PRN

## 2022-12-14 MED ORDER — SODIUM CHLORIDE 0.9% FLUSH
3.0000 mL | Freq: Two times a day (BID) | INTRAVENOUS | Status: DC
  Administered 2022-12-14 – 2022-12-16 (×4): 3 mL via INTRAVENOUS

## 2022-12-14 NOTE — H&P (Signed)
History and Physical    Patient: Richard Hardy XLK:[id number] DOB: 1946-10-04 DOA: 12/14/2022 DOS: the patient was seen and examined on 12/14/2022 PCP: Crecencio Mc, MD  Patient coming from: Home  Chief Complaint:  Chief Complaint  Patient presents with   Emesis   HPI: Richard Hardy is a 77 y.o. male with medical history significant for hypertension, CVD, type 2 diabetes, OSA, BPPV, who presents with vomiting and diarrhea in the setting of newly diagnosed COVID infection.  Patient reports he has been feeling unwell for about a week, he took a home COVID test earlier this week which was positive.  His primary symptoms have been cough, nausea, and diarrhea.  He reports he has been unable to eat or drink well since the beginning of this week and has had constant daily diarrhea.  No blood in stool.  He had a video visit with his primary care office 2 days prior was prescribed Paxlovid which she has been taking.  He reports he has not really taken any of his medications for the past 2 days due to nausea.  He has been feeling weak and worn out but denies any confusion.  In the ED vital signs were unremarkable.  CMP notable for severe hyponatremia at 118, creatinine 0.75, chloride 85, bicarb 20, glucose 193, LFTs minimally elevated.  CBC showed new leukopenia to 2.4, along with new thrombocytopenia at 82, hemoglobin was normal.  He was given loperamide, ondansetron, given a 1 L bolus and started on a normal saline infusion, and admitted for further management.    Review of Systems: As mentioned in the history of present illness. All other systems reviewed and are negative. Past Medical History:  Diagnosis Date   Arthritis    Basal cell carcinoma 02/02/2020   L prox med calf - ED&C    Diabetes mellitus without complication ([place]) 3664   Dysrhythmia    Glucose intolerance (impaired glucose tolerance)    Hyperkalemia 05/21/2017   Secondary to ARB   Hyperlipidemia    Obesity    Sleep apnea    On  BiPAP, Dr. Richardson Landry   Thrombocythemia    Dr. Grayland Ormond   Past Surgical History:  Procedure Laterality Date   CATARACT EXTRACTION, BILATERAL     COLONOSCOPY WITH PROPOFOL N/A 06/23/2017   Procedure: COLONOSCOPY WITH PROPOFOL;  Surgeon: Manya Silvas, MD;  Location: [facility] ENDOSCOPY;  Service: Endoscopy;  Laterality: N/A;   COLONOSCOPY WITH PROPOFOL N/A 10/16/2020   Procedure: COLONOSCOPY WITH PROPOFOL;  Surgeon: Toledo, Benay Pike, MD;  Location: [facility] ENDOSCOPY;  Service: Gastroenterology;  Laterality: N/A;   EYE SURGERY     LITHOTRIPSY     Dr. Bernardo Heater   NASAL SEPTUM SURGERY     TONSILLECTOMY     Social History:  reports that he quit smoking about 59 years ago. His smoking use included cigarettes. He has a 2.00 pack-year smoking history. He has never used smokeless tobacco. He reports current alcohol use. He reports that he does not use drugs.  Allergies  Allergen Reactions   Losartan     Hyperkalemia    Lisinopril     cough   Metformin And Related     Stomach aches   Sulfonamide Derivatives     REACTION: hives   Augmentin [Amoxicillin-Pot Clavulanate] Rash    Family History  Problem Relation Age of Onset   Cancer Mother        brain   Heart attack Father 39   Heart disease Father  Heart disease Brother        s/p CABG   Crohn's disease Brother    Hyperlipidemia Son    Hypertension Son    Sleep apnea Son    Thyroid disease Son    Heart disease Paternal Grandfather     Prior to Admission medications   Medication Sig Start Date End Date Taking? Authorizing Provider  amLODipine (NORVASC) 5 MG tablet Take 1 tablet (5 mg total) by mouth daily. 08/06/22   Minna Merritts, MD  atorvastatin (LIPITOR) 20 MG tablet Take 1 tablet (20 mg total) by mouth daily. 08/06/22   Minna Merritts, MD  Blood Glucose Monitoring Suppl (ONETOUCH VERIO IQ SYSTEM) w/Device KIT Use to check blood sugars once daily. 01/05/19   Crecencio Mc, MD  chlorhexidine (PERIDEX) 0.12 % solution  SMARTSIG:0.5 Ounce(s) By Mouth Twice Daily 04/26/21   [provider]  CINNAMON PO Take 2 capsules by mouth daily.    [provider]  Coenzyme Q10 (COQ10) 200 MG CAPS Take 1 tablet by mouth daily.    [provider]  Fish Oil OIL Take by mouth daily.    [provider]  fluticasone (FLONASE) 50 MCG/ACT nasal spray Place into the nose at bedtime.    [provider]  furosemide (LASIX) 20 MG tablet Take 0.5 tablets (10 mg total) by mouth daily. 08/06/22   Minna Merritts, MD  glipiZIDE (GLUCOTROL) 5 MG tablet Take 1 tablet (5 mg total) by mouth 2 (two) times daily before a meal. 11/11/22   Crecencio Mc, MD  glucosamine-chondroitin 500-400 MG tablet Take 1 tablet by mouth in the morning and at bedtime.    [provider]  hydrochlorothiazide (HYDRODIURIL) 25 MG tablet Take 1 tablet (25 mg total) by mouth daily. 08/06/22   Minna Merritts, MD  MAGNESIUM CITRATE PO Take 1 capsule by mouth in the morning and at bedtime.    [provider]  metFORMIN (GLUCOPHAGE-XR) 500 MG 24 hr tablet TAKE 1 TABLET BY MOUTH EVERY DAY WITH BREAKFAST 07/11/22   Crecencio Mc, MD  Multiple Vitamin (MULTIVITAMIN) capsule Take 1 capsule by mouth daily.    [provider]  mupirocin ointment (BACTROBAN) 2 % Apply 1 application topically 2 (two) times daily. 08/16/21   Ralene Bathe, MD  naproxen (NAPROSYN) 250 MG tablet PLEASE SEE ATTACHED FOR DETAILED DIRECTIONS 04/26/21   [provider]  nirmatrelvir/ritonavir (PAXLOVID) 20 x 150 MG & 10 x '100MG'$  TABS Take 3 tablets by mouth 2 (two) times daily for 5 days. (Take nirmatrelvir 150 mg two tablets twice daily for 5 days and ritonavir 100 mg one tablet twice daily for 5 days) Patient GFR is 76 12/12/22 12/17/22  Ailene Ards, NP  [facility] DELICA LANCETS 67T MISC Use to check blood sugars once daily. 01/05/19   Crecencio Mc, MD  ONETOUCH VERIO test strip USE TO CHECK SUGARS ONCE OR TWICE DAILY .  05/14/21   Crecencio Mc, MD  Turmeric 500 MG TABS Take 1 tablet by mouth daily.    [provider]  vitamin C (ASCORBIC ACID) 500 MG tablet Take 500 mg by mouth daily.    [provider]    Physical Exam: Vitals:   12/14/22 0256 12/14/22 0321  BP:  128/65  Pulse:  (!) 58  Resp:  18  Temp:  97.6 F (36.4 C)  TempSrc:  Oral  SpO2:  93%  Weight: 96.3 kg   Height: '5\' 10"'$  (  1.778 m)    Physical Exam Constitutional:      General: He is not in acute distress.    Appearance: Normal appearance. He is ill-appearing. He is not toxic-appearing or diaphoretic.  HENT:     Head: Normocephalic and atraumatic.     Mouth/Throat:     Mouth: Mucous membranes are dry.  Eyes:     General: No scleral icterus. Cardiovascular:     Rate and Rhythm: Normal rate and regular rhythm.     Heart sounds: Normal heart sounds.  Pulmonary:     Effort: Pulmonary effort is normal.     Breath sounds: Normal breath sounds.  Abdominal:     General: Bowel sounds are normal. There is no distension.     Palpations: Abdomen is soft.     Tenderness: There is no abdominal tenderness.  Musculoskeletal:     Right lower leg: No edema.     Left lower leg: No edema.  Skin:    General: Skin is warm and dry.  Neurological:     General: No focal deficit present.     Mental Status: He is alert.  Psychiatric:        Mood and Affect: Mood normal.        Behavior: Behavior normal.        Thought Content: Thought content normal.        Judgment: Judgment normal.      Data Reviewed: Results for orders placed or performed during the hospital encounter of 12/14/22 (from the past 24 hour(s))  Magnesium     Status: Abnormal   Collection Time: 12/14/22  3:07 AM  Result Value Ref Range   Magnesium 1.6 (L) 1.7 - 2.4 mg/dL  Lipase, blood     Status: None   Collection Time: 12/14/22  3:08 AM  Result Value Ref Range   Lipase 27 11 - 51 U/L  Comprehensive metabolic panel     Status: Abnormal   Collection  Time: 12/14/22  3:08 AM  Result Value Ref Range   Sodium 118 (LL) 135 - 145 mmol/L   Potassium 3.5 3.5 - 5.1 mmol/L   Chloride 85 (L) 98 - 111 mmol/L   CO2 20 (L) 22 - 32 mmol/L   Glucose, Bld 193 (H) 70 - 99 mg/dL   BUN 20 8 - 23 mg/dL   Creatinine, Ser 0.75 0.61 - 1.24 mg/dL   Calcium 7.7 (L) 8.9 - 10.3 mg/dL   Total Protein 6.3 (L) 6.5 - 8.1 g/dL   Albumin 3.3 (L) 3.5 - 5.0 g/dL   AST 46 (H) 15 - 41 U/L   ALT 47 (H) 0 - 44 U/L   Alkaline Phosphatase 43 38 - 126 U/L   Total Bilirubin 1.2 0.3 - 1.2 mg/dL   GFR, Estimated >60 >60 mL/min   Anion gap 13 5 - 15  CBC     Status: Abnormal   Collection Time: 12/14/22  3:08 AM  Result Value Ref Range   WBC 2.4 (L) 4.0 - 10.5 K/uL   RBC 4.36 4.22 - 5.81 MIL/uL   Hemoglobin 14.1 13.0 - 17.0 g/dL   HCT 38.3 (L) 39.0 - 52.0 %   MCV 87.8 80.0 - 100.0 fL   MCH 32.3 26.0 - 34.0 pg   MCHC 36.8 (H) 30.0 - 36.0 g/dL   RDW 11.6 11.5 - 15.5 %   Platelets 82 (L) 150 - 400 K/uL   nRBC 0.0 0.0 - 0.2 %  Urinalysis, Routine w reflex  microscopic Urine, Clean Catch     Status: Abnormal   Collection Time: 12/14/22  7:43 AM  Result Value Ref Range   Color, Urine YELLOW (A) YELLOW   APPearance HAZY (A) CLEAR   Specific Gravity, Urine 1.021 1.005 - 1.030   pH 5.0 5.0 - 8.0   Glucose, UA >=500 (A) NEGATIVE mg/dL   Hgb urine dipstick NEGATIVE NEGATIVE   Bilirubin Urine NEGATIVE NEGATIVE   Ketones, ur 20 (A) NEGATIVE mg/dL   Protein, ur 30 (A) NEGATIVE mg/dL   Nitrite NEGATIVE NEGATIVE   Leukocytes,Ua NEGATIVE NEGATIVE   RBC / HPF 0-5 0 - 5 RBC/hpf   WBC, UA 0-5 0 - 5 WBC/hpf   Bacteria, UA NONE SEEN NONE SEEN   Squamous Epithelial / HPF 0-5 0 - 5 /HPF   Mucus PRESENT       There are no new results to review at this time.  Assessment and Plan: Richard Hardy is a 77 y.o. male with medical history significant for hypertension, CVD, type 2 diabetes, OSA, BPPV, who presents with vomiting and diarrhea in the setting of newly diagnosed COVID  infection and is found to have severe hyponatremia.  * Hyponatremia Severe hyponatremia with sodium on admission 118.  Does not appear to have any symptoms.  On exam and based on history patient appears to be quite volume depleted, already status post 1 L NS bolus and receiving NS at 125 cc an hour.  Patient also prescribed hydrochlorothiazide, suspect this has been contributing as well though underlying cause primarily hypovolemia from vomiting and diarrhea. - Monitor CMP every 6 hours - Continue NS GTT at 125 cc an hour  Known medical problems Type 2 diabetes-4 times daily insulin sliding scale correction factor.  Hold metformin and glipizide while admitted. Hyperlipidemia-hold atorvastatin given transaminitis Edema-hold furosemide  Transaminitis Suspect due to hypovolemia, continue to monitor.  Bicytopenia Leukopenia and thrombocytopenia present on admission, suspect this is due to his acute viral infection with COVID, continue to monitor for correction with resolution of illness.  COVID-19 Patient is high risk for acute worsening, has taken 1 day of Paxlovid, will continue remainder of course while inpatient.  Hypertension Patient on amlodipine and hydrochlorothiazide, normotensive. - Continue amlodipine - Discontinue hydrochlorothiazide      Advance Care Planning:   Code Status: Full Code , confirmed with patient  Consults: None  Family Communication: wife updated at bedisde  Severity of Illness: The appropriate patient status for this patient is INPATIENT. Inpatient status is judged to be reasonable and necessary in order to provide the required intensity of service to ensure the patient's safety. The patient's presenting symptoms, physical exam findings, and initial radiographic and laboratory data in the context of their chronic comorbidities is felt to place them at high risk for further clinical deterioration. Furthermore, it is not anticipated that the patient will be  medically stable for discharge from the hospital within 2 midnights of admission.   * I certify that at the point of admission it is my clinical judgment that the patient will require inpatient hospital care spanning beyond 2 midnights from the point of admission due to high intensity of service, high risk for further deterioration and high frequency of surveillance required.*  Author: Clarnce Flock, MD 12/14/2022 8:59 AM  For on call review [email or web address].

## 2022-12-14 NOTE — Assessment & Plan Note (Signed)
Patient is high risk for acute worsening, has taken 1 day of Paxlovid, will continue remainder of course while inpatient.

## 2022-12-14 NOTE — Assessment & Plan Note (Signed)
Leukopenia and thrombocytopenia present on admission, suspect this is due to his acute viral infection with COVID, continue to monitor for correction with resolution of illness.

## 2022-12-14 NOTE — ED Provider Notes (Signed)
[facility] Provider Note    Event Date/Time   First MD Initiated Contact with Patient 12/14/22 [phone]     (approximate)   History   Emesis   HPI  Richard Hardy is a 77 y.o. male with history of diabetes who presents to the emergency department with several days of nausea, vomiting and diarrhea.  Recently diagnosed with COVID-19 about 5 days ago.  Denies any fevers, cough, chest pain, shortness of breath, abdominal pain.  States he feels dehydrated.   History provided by patient.    Past Medical History:  Diagnosis Date   Arthritis    Basal cell carcinoma 02/02/2020   L prox med calf - ED&C    Diabetes mellitus without complication ([place]) 7322   Dysrhythmia    Glucose intolerance (impaired glucose tolerance)    Hyperkalemia 05/21/2017   Secondary to ARB   Hyperlipidemia    Obesity    Sleep apnea    On BiPAP, Dr. Richardson Landry   Thrombocythemia    Dr. Grayland Ormond    Past Surgical History:  Procedure Laterality Date   CATARACT EXTRACTION, BILATERAL     COLONOSCOPY WITH PROPOFOL N/A 06/23/2017   Procedure: COLONOSCOPY WITH PROPOFOL;  Surgeon: Manya Silvas, MD;  Location: [facility] ENDOSCOPY;  Service: Endoscopy;  Laterality: N/A;   COLONOSCOPY WITH PROPOFOL N/A 10/16/2020   Procedure: COLONOSCOPY WITH PROPOFOL;  Surgeon: Toledo, Benay Pike, MD;  Location: [facility] ENDOSCOPY;  Service: Gastroenterology;  Laterality: N/A;   EYE SURGERY     LITHOTRIPSY     Dr. Bernardo Heater   NASAL SEPTUM SURGERY     TONSILLECTOMY      MEDICATIONS:  Prior to Admission medications   Medication Sig Start Date End Date Taking? Authorizing Provider  amLODipine (NORVASC) 5 MG tablet Take 1 tablet (5 mg total) by mouth daily. 08/06/22   Minna Merritts, MD  atorvastatin (LIPITOR) 20 MG tablet Take 1 tablet (20 mg total) by mouth daily. 08/06/22   Minna Merritts, MD  Blood Glucose Monitoring Suppl (ONETOUCH VERIO IQ SYSTEM) w/Device KIT Use to check blood sugars once daily. 01/05/19    Crecencio Mc, MD  chlorhexidine (PERIDEX) 0.12 % solution SMARTSIG:0.5 Ounce(s) By Mouth Twice Daily 04/26/21   [provider]  CINNAMON PO Take 2 capsules by mouth daily.    [provider]  Coenzyme Q10 (COQ10) 200 MG CAPS Take 1 tablet by mouth daily.    [provider]  Fish Oil OIL Take by mouth daily.    [provider]  fluticasone (FLONASE) 50 MCG/ACT nasal spray Place into the nose at bedtime.    [provider]  furosemide (LASIX) 20 MG tablet Take 0.5 tablets (10 mg total) by mouth daily. 08/06/22   Minna Merritts, MD  glipiZIDE (GLUCOTROL) 5 MG tablet Take 1 tablet (5 mg total) by mouth 2 (two) times daily before a meal. 11/11/22   Crecencio Mc, MD  glucosamine-chondroitin 500-400 MG tablet Take 1 tablet by mouth in the morning and at bedtime.    [provider]  hydrochlorothiazide (HYDRODIURIL) 25 MG tablet Take 1 tablet (25 mg total) by mouth daily. 08/06/22   Minna Merritts, MD  MAGNESIUM CITRATE PO Take 1 capsule by mouth in the morning and at bedtime.    [provider]  metFORMIN (GLUCOPHAGE-XR) 500 MG 24 hr tablet TAKE 1 TABLET BY MOUTH EVERY DAY WITH BREAKFAST 07/11/22   Crecencio Mc, MD  Multiple Vitamin (MULTIVITAMIN) capsule  Take 1 capsule by mouth daily.    [provider]  mupirocin ointment (BACTROBAN) 2 % Apply 1 application topically 2 (two) times daily. 08/16/21   Ralene Bathe, MD  naproxen (NAPROSYN) 250 MG tablet PLEASE SEE ATTACHED FOR DETAILED DIRECTIONS 04/26/21   [provider]  nirmatrelvir/ritonavir (PAXLOVID) 20 x 150 MG & 10 x '100MG'$  TABS Take 3 tablets by mouth 2 (two) times daily for 5 days. (Take nirmatrelvir 150 mg two tablets twice daily for 5 days and ritonavir 100 mg one tablet twice daily for 5 days) Patient GFR is 76 12/12/22 12/17/22  Ailene Ards, NP  [facility] DELICA LANCETS 24M MISC Use to check blood sugars once daily. 01/05/19   Crecencio Mc, MD  ONETOUCH  VERIO test strip USE TO CHECK SUGARS ONCE OR TWICE DAILY . 05/14/21   Crecencio Mc, MD  Turmeric 500 MG TABS Take 1 tablet by mouth daily.    [provider]  vitamin C (ASCORBIC ACID) 500 MG tablet Take 500 mg by mouth daily.    [provider]    Physical Exam   Triage Vital Signs: ED Triage Vitals  Enc Vitals Group     BP 12/14/22 0321 128/65     Pulse Rate 12/14/22 0321 (!) 58     Resp 12/14/22 0321 18     Temp 12/14/22 0321 97.6 F (36.4 C)     Temp Source 12/14/22 0321 Oral     SpO2 12/14/22 0321 93 %     Weight 12/14/22 0256 212 lb 4.9 oz (96.3 kg)     Height 12/14/22 0256 '5\' 10"'$  (1.778 m)     Head Circumference --      Peak Flow --      Pain Score --      Pain Loc --      Pain Edu? --      Excl. in [place]? --     Most recent vital signs: Vitals:   12/14/22 0321  BP: 128/65  Pulse: (!) 58  Resp: 18  Temp: 97.6 F (36.4 C)  SpO2: 93%    CONSTITUTIONAL: Alert and oriented and responds appropriately to questions. Well-appearing; well-nourished, elderly HEAD: Normocephalic, atraumatic EYES: Conjunctivae clear, pupils appear equal, sclera nonicteric ENT: normal nose; dry appearing mucous membranes NECK: Supple, normal ROM CARD: RRR; S1 and S2 appreciated; no murmurs, no clicks, no rubs, no gallops RESP: Normal chest excursion without splinting or tachypnea; breath sounds clear and equal bilaterally; no wheezes, no rhonchi, no rales, no hypoxia or respiratory distress, speaking full sentences ABD/GI: Normal bowel sounds; non-distended; soft, non-tender, no rebound, no guarding, no peritoneal signs BACK: The back appears normal EXT: Normal ROM in all joints; no deformity noted, no edema; no cyanosis SKIN: Normal color for age and race; warm; no rash on exposed skin NEURO: Moves all extremities equally, normal speech PSYCH: The patient's mood and manner are appropriate.   ED Results / Procedures / Treatments   LABS: (all labs ordered are listed,  but only abnormal results are displayed) Labs Reviewed  COMPREHENSIVE METABOLIC PANEL - Abnormal; Notable for the following components:      Result Value   Sodium 118 (*)    Chloride 85 (*)    CO2 20 (*)    Glucose, Bld 193 (*)    Calcium 7.7 (*)    Total Protein 6.3 (*)    Albumin 3.3 (*)    AST 46 (*)    ALT 47 (*)  All other components within normal limits  CBC - Abnormal; Notable for the following components:   WBC 2.4 (*)    HCT 38.3 (*)    MCHC 36.8 (*)    Platelets 82 (*)    All other components within normal limits  MAGNESIUM - Abnormal; Notable for the following components:   Magnesium 1.6 (*)    All other components within normal limits  LIPASE, BLOOD  URINALYSIS, ROUTINE W REFLEX MICROSCOPIC     EKG:  EKG Interpretation  Date/Time:  Saturday December 14 2022 03:09:24 EST Ventricular Rate:  57 PR Interval:  230 QRS Duration: 74 QT Interval:  468 QTC Calculation: 455 R Axis:   62 Text Interpretation:  Poor data quality, interpretation may be adversely affected Sinus bradycardia with 1st degree A-V block with occasional Premature ventricular complexes with ventricular escape complexes Low voltage QRS Possible Anterolateral infarct , age undetermined Abnormal ECG When compared with ECG of 11-Aug-2007 10:47, Significant changes have occurred Confirmed by Pryor Curia [id number]) on 12/14/2022 6:32:00 AM         RADIOLOGY: My personal review and interpretation of imaging:    I have personally reviewed all radiology reports.   No results found.   PROCEDURES:  Critical Care performed: Yes, see critical care procedure note(s)   CRITICAL CARE Performed by: Pryor Curia   Total critical care time: 40 minutes  Critical care time was exclusive of separately billable procedures and treating other patients.  Critical care was necessary to treat or prevent imminent or life-threatening deterioration.  Critical care was time spent personally by me on the  following activities: development of treatment plan with patient and/or surrogate as well as nursing, discussions with consultants, evaluation of patient's response to treatment, examination of patient, obtaining history from patient or surrogate, ordering and performing treatments and interventions, ordering and review of laboratory studies, ordering and review of radiographic studies, pulse oximetry and re-evaluation of patient's condition.   Marland Kitchen1-3 Lead EKG Interpretation  Performed by: Eulas Schweitzer, Delice Bison, DO Authorized by: Tristine Langi, Delice Bison, DO     Interpretation: normal     ECG rate:  58   ECG rate assessment: bradycardic     Rhythm: sinus bradycardia     Ectopy: none     Conduction: normal       IMPRESSION / MDM / ASSESSMENT AND PLAN / ED COURSE  I reviewed the triage vital signs and the nursing notes.    Patient here with nausea, vomiting diarrhea after recent COVID-19 diagnosis.  The patient is on the cardiac monitor to evaluate for evidence of arrhythmia and/or significant heart rate changes.   DIFFERENTIAL DIAGNOSIS (includes but not limited to):   Viral gastroenteritis, dehydration, UTI, doubt appendicitis, colitis, bowel obstruction, perforation based on benign exam.   Patient's presentation is most consistent with acute presentation with potential threat to life or bodily function.   PLAN: Workup initiated from triage.  Patient has leukopenia consistent with viral illness.  He has significant hyponatremia likely from GI loss.  Normal creatinine.  Minimal elevation of AST and ALT but normal lipase.  Abdominal exam benign.  Will add on magnesium level.  Initial EKG showed significant artifact.  Will repeat.  Will discuss with the hospitalist for admission.  Will give IV fluids, antiemetics, antidiarrheal medications.   MEDICATIONS GIVEN IN ED: Medications  0.9 %  sodium chloride infusion ( Intravenous New Bag/Given 12/14/22 0624)  magnesium sulfate IVPB 1 g 100 mL (has no  administration in time range)  ondansetron [facility]) injection 4 mg (4 mg Intravenous Given 12/14/22 0303)  sodium chloride 0.9 % bolus 1,000 mL (1,000 mLs Intravenous New Bag/Given 12/14/22 0624)  ondansetron (ZOFRAN) injection 4 mg (4 mg Intravenous Given 12/14/22 0624)  loperamide (IMODIUM) capsule 2 mg (2 mg Oral Given 12/14/22 6378)     ED COURSE: Magnesium level also slightly low at 1.6.  Will give 1 g IV replacement.   CONSULTS:  Consulted and discussed patient's case with hospitalist, Dr. Damita Dunnings.  I have recommended admission and consulting physician agrees and will place admission orders.  Patient (and family if present) agree with this plan.   I reviewed all nursing notes, vitals, pertinent previous records.  All labs, EKGs, imaging ordered have been independently reviewed and interpreted by myself.    OUTSIDE RECORDS REVIEWED: Reviewed patient's last gastroenterology note on 08/23/2020.       FINAL CLINICAL IMPRESSION(S) / ED DIAGNOSES   Final diagnoses:  Hyponatremia  COVID-19  Nausea vomiting and diarrhea  Hypomagnesemia     Rx / DC Orders   ED Discharge Orders     None        Note:  This document was prepared using Dragon voice recognition software and may include unintentional dictation errors.   Charlie Char, Delice Bison, DO 12/14/22 [phone]

## 2022-12-14 NOTE — Progress Notes (Signed)
PHARMACIST - PHYSICIAN COMMUNICATION  CONCERNING:  Enoxaparin (Lovenox) for DVT Prophylaxis    RECOMMENDATION: Patient was prescribed enoxaprin '40mg'$  q24 hours for VTE prophylaxis.   Filed Weights   12/14/22 0256  Weight: 96.3 kg (212 lb 4.9 oz)    Body mass index is 30.46 kg/m.  Estimated Creatinine Clearance: 88.6 mL/min (by C-G formula based on SCr of 0.75 mg/dL).   Based on [place] patient is candidate for enoxaparin 0.'5mg'$ /kg TBW SQ every 24 hours based on BMI being >30.   DESCRIPTION: Pharmacy has adjusted enoxaparin dose per [facility] policy.  Patient is now receiving enoxaparin 47.5 mg every 24 hours    Pernell Dupre, PharmD Clinical Pharmacist  12/14/2022 8:46 AM

## 2022-12-14 NOTE — Progress Notes (Signed)
Lab Results  Component Value Date/Time   NA 123 (L) 12/14/2022 02:20 PM   NA 118 (LL) 12/14/2022 09:54 AM   NA 118 (LL) 12/14/2022 03:08 AM   Serum sodium now corrected by 5 after initiating 3% NS gtt, will stop to prevent overcorrection and continue to monitor Na q4h. RN notified of plan.

## 2022-12-14 NOTE — Assessment & Plan Note (Signed)
Patient on amlodipine and hydrochlorothiazide, normotensive. - Continue amlodipine - Discontinue hydrochlorothiazide

## 2022-12-14 NOTE — Assessment & Plan Note (Signed)
Suspect due to hypovolemia, continue to monitor.

## 2022-12-14 NOTE — ED Notes (Signed)
Pt oxygen saturation 88% on RA while sleeping. 2L [place] applied. NP notified.

## 2022-12-14 NOTE — Assessment & Plan Note (Signed)
Severe hyponatremia with sodium on admission 118.  Does not appear to have any symptoms.  On exam and based on history patient appears to be quite volume depleted, already status post 1 L NS bolus and receiving NS at 125 cc an hour.  Patient also prescribed hydrochlorothiazide, suspect this has been contributing as well though underlying cause primarily hypovolemia from vomiting and diarrhea. - Monitor CMP every 6 hours - Continue NS GTT at 125 cc an hour

## 2022-12-14 NOTE — ED Triage Notes (Signed)
Pt presents via EMS from home with complaints of emesis and diarrhea for the last several days. Dx with COVID 5 days ago. Airway patent - respirations equal and unlabored. Denies AP,  CP or SOB.

## 2022-12-14 NOTE — Progress Notes (Signed)
Notified by RN that sodium remains extremely low at 118.   Will give 250 mL 3% NS bolus and then resume 0.9% NS gtt, already scheduled to have sodium rechecked in 6 hours, further management pending that result.

## 2022-12-14 NOTE — Progress Notes (Signed)
MEDICATION RELATED CONSULT NOTE - INITIAL   Pharmacy Consult for Monitoring of Hypertonic (3%) Saline Administration  Indication: Hyponatremia   Allergies  Allergen Reactions   Losartan     Hyperkalemia    Lisinopril     cough   Metformin And Related     Stomach aches   Sulfonamide Derivatives     REACTION: hives   Augmentin [Amoxicillin-Pot Clavulanate] Rash    Patient Measurements: Height: '5\' 10"'$  (177.8 cm) Weight: 96.3 kg (212 lb 4.9 oz) IBW/kg (Calculated) : 73   Vital Signs: Temp: 97.6 F (36.4 C) (01/06 0932) Temp Source: Oral (01/06 0932) BP: 128/80 (01/06 0932) Pulse Rate: 60 (01/06 0932) Intake/Output from previous day: No intake/output data recorded. Intake/Output from this shift: Total I/O In: 1622.9 [I.V.:622.9; IV Piggyback:1000] Out: -   Labs: Recent Labs    12/14/22 0307 12/14/22 0308 12/14/22 0954  WBC  --  2.4*  --   HGB  --  14.1  --   HCT  --  38.3*  --   PLT  --  82*  --   CREATININE  --  0.75 0.71  MG 1.6*  --   --   ALBUMIN  --  3.3* 2.8*  PROT  --  6.3* 5.4*  AST  --  46* 39  ALT  --  47* 42  ALKPHOS  --  43 36*  BILITOT  --  1.2 0.9   Estimated Creatinine Clearance: 88.6 mL/min (by C-G formula based on SCr of 0.71 mg/dL).   Medical History: Past Medical History:  Diagnosis Date   Arthritis    Basal cell carcinoma 02/02/2020   L prox med calf - ED&C    Diabetes mellitus without complication ([place]) 6546   Dysrhythmia    Glucose intolerance (impaired glucose tolerance)    Hyperkalemia 05/21/2017   Secondary to ARB   Hyperlipidemia    Obesity    Sleep apnea    On BiPAP, Dr. Richardson Landry   Thrombocythemia    Dr. Grayland Ormond    Medications:  Infusions:   sodium chloride (hypertonic)      Assessment: 77 yo male admitted with severe hyponatremia. Patient has been receiving NaCl @ 177m/hr. Patient now being transitioned to hypertonic saline. Pharmacy has been consulted for monitoring.    Goal of Therapy:  Sodium rise no  more than 4 mEq/L over 2 hours and 6 mEq/L in 4 hours  Recommend no more then 149m/L rise in 24 hours  Plan:  Sodium Chloride (hypertonic) 3% started at 302mr.  Will check Sodium level 2 hours after initiation, then every 4 hours thereafter.  Pharmacy will evaluate and contact MD per consult   ShePernell DupreharmD, BCPS Clinical Pharmacist 12/14/2022 11:37 AM

## 2022-12-14 NOTE — Progress Notes (Signed)
       CROSS COVER NOTE  NAME: Richard Hardy MRN: [id number] DOB : [birth year] ATTENDING PHYSICIAN: Clarnce Flock, MD    Date of Service   12/14/2022   HPI/Events of Note   Message received from RN reporting desaturation to 88% while asleep, patient was placed on 2 L.  On review of the chart [name] has a history of OSA and he reports that he currently wears CPAP at home.  Has not had any nausea or vomiting at least since current RN took over at 7 PM.  Interventions   Assessment/Plan:  qHS CPAP      To reach the provider On-Call:   7AM- 7PM see care teams to locate the attending and reach out to them via [email or web address]. 7PM-7AM contact night-coverage If you still have difficulty reaching the appropriate provider, please page the [facility] (Director on Call) for [facility] on amion for assistance  This document was prepared using Set designer software and may include unintentional dictation errors.  Neomia Glass DNP, MBA, FNP-BC Nurse Practitioner [facility] Pager [phone]

## 2022-12-14 NOTE — Assessment & Plan Note (Addendum)
Type 2 diabetes-4 times daily insulin sliding scale correction factor.  Hold metformin and glipizide while admitted. Hyperlipidemia-hold atorvastatin given transaminitis Edema-hold furosemide

## 2022-12-15 ENCOUNTER — Other Ambulatory Visit: Payer: Self-pay

## 2022-12-15 DIAGNOSIS — E871 Hypo-osmolality and hyponatremia: Secondary | ICD-10-CM | POA: Diagnosis not present

## 2022-12-15 LAB — CBG MONITORING, ED
Glucose-Capillary: 136 mg/dL — ABNORMAL HIGH (ref 70–99)
Glucose-Capillary: 141 mg/dL — ABNORMAL HIGH (ref 70–99)
Glucose-Capillary: 124 mg/dL — ABNORMAL HIGH (ref 70–99)

## 2022-12-15 LAB — COMPREHENSIVE METABOLIC PANEL
ALT: 37 U/L (ref 0–44)
AST: 43 U/L — ABNORMAL HIGH (ref 15–41)
Albumin: 2.8 g/dL — ABNORMAL LOW (ref 3.5–5.0)
Alkaline Phosphatase: 36 U/L — ABNORMAL LOW (ref 38–126)
Anion gap: 8 (ref 5–15)
BUN: 14 mg/dL (ref 8–23)
CO2: 23 mmol/L (ref 22–32)
Calcium: 7.6 mg/dL — ABNORMAL LOW (ref 8.9–10.3)
Chloride: 97 mmol/L — ABNORMAL LOW (ref 98–111)
Creatinine, Ser: 0.75 mg/dL (ref 0.61–1.24)
GFR, Estimated: >60 mL/min (ref >60)
Glucose, Bld: 136 mg/dL — ABNORMAL HIGH (ref 70–99)
Potassium: 3.8 mmol/L (ref 3.5–5.1)
Sodium: 128 mmol/L — ABNORMAL LOW (ref 135–145)
Total Bilirubin: 0.8 mg/dL (ref 0.3–1.2)
Total Protein: 5.3 g/dL — ABNORMAL LOW (ref 6.5–8.1)

## 2022-12-15 LAB — CBC
HCT: 37.2 % — ABNORMAL LOW (ref 39.0–52.0)
Hemoglobin: 13.6 g/dL (ref 13.0–17.0)
MCH: 32.4 pg (ref 26.0–34.0)
MCHC: 36.6 g/dL — ABNORMAL HIGH (ref 30.0–36.0)
MCV: 88.6 fL (ref 80.0–100.0)
Platelets: 83 10*3/uL — ABNORMAL LOW (ref 150–400)
RBC: 4.2 MIL/uL — ABNORMAL LOW (ref 4.22–5.81)
RDW: 12 % (ref 11.5–15.5)
WBC: 5.2 10*3/uL (ref 4.0–10.5)
nRBC: 0 % (ref 0.0–0.2)

## 2022-12-15 LAB — SODIUM
Sodium: 127 mmol/L — ABNORMAL LOW (ref 135–145)
Sodium: 128 mmol/L — ABNORMAL LOW (ref 135–145)
Sodium: 131 mmol/L — ABNORMAL LOW (ref 135–145)
Sodium: 131 mmol/L — ABNORMAL LOW (ref 135–145)
Sodium: 133 mmol/L — ABNORMAL LOW (ref 135–145)

## 2022-12-15 LAB — GLUCOSE, CAPILLARY: Glucose-Capillary: 92 mg/dL (ref 70–99)

## 2022-12-15 MED ORDER — NIRMATRELVIR/RITONAVIR (PAXLOVID)TABLET
3.0000 | ORAL_TABLET | Freq: Two times a day (BID) | ORAL | Status: DC
  Administered 2022-12-15 – 2022-12-16 (×3): 3 via ORAL
  Filled 2022-12-15 (×3): qty 30

## 2022-12-15 MED ORDER — NIRMATRELVIR/RITONAVIR (PAXLOVID)TABLET: 3.0000 | ORAL_TABLET | Freq: Two times a day (BID) | ORAL | Status: DC

## 2022-12-15 MED ORDER — ATORVASTATIN CALCIUM 20 MG PO TABS: 20.0000 mg | ORAL_TABLET | Freq: Every day | ORAL | Status: DC

## 2022-12-15 NOTE — ED Notes (Signed)
Advised nurse that patient has ready bed 

## 2022-12-15 NOTE — Progress Notes (Signed)
PROGRESS NOTE  Richard Hardy QJF:[id number] DOB: Sep 08, 1946 DOA: 12/14/2022 PCP: Crecencio Mc, MD  Hospital Course/Subjective: Richard Hardy is a 77 y.o. male with medical history significant for hypertension, CVD, type 2 diabetes, OSA, BPPV, who presents with vomiting and diarrhea in the setting of newly diagnosed COVID infection.  He was found to have significant hyponatremia sodium 118 on admission.  He was admitted to the hospitalist service, started on normal saline infusion also received hypertonic saline and his sodium is now improved to 128 this morning.    His nausea vomiting and diarrhea have resolved.  He was placed on 2 L nasal cannula overnight, for hypoxia while sleeping.  Patient does wear CPAP at home.  Assessment/Plan:  Principal Problem:   Hyponatremia Active Problems:   Hypertension   Benign positional vertigo   Abdominal aortic atherosclerosis ([place])   COVID-19   Diabetes type 2, controlled (HCC)   Sleep apnea   Bicytopenia   Transaminitis   Known medical problems    Assessment and Plan: * Hyponatremia Severe hyponatremia with sodium on admission 118.  Does not appear to have any symptoms.  On exam and based on history patient appears to be quite volume depleted, already status post 1 L NS bolus and receiving NS at 125 cc an hour.  Patient also prescribed hydrochlorothiazide, suspect this has been contributing as well though underlying cause primarily hypovolemia from vomiting and diarrhea. - He is now status post hypertonic saline, sodium is improving - Continue to trend sodium levels Q 4 hours - continue to hold HCTZ and Lasix  Known medical problems Type 2 diabetes-4 times daily insulin sliding scale correction factor.  Hold metformin and glipizide while admitted. Hyperlipidemia-hold atorvastatin given transaminitis which is likely due to nausea and vomiting Edema-hold furosemide due to hyponatremia  Transaminitis Suspect due to hypovolemia, continue to  monitor.  Bicytopenia Leukopenia and thrombocytopenia present on admission, suspect this is due to his acute viral infection with COVID, continue to monitor for correction with resolution of illness.  COVID-19 Patient is high risk for acute worsening, has taken 1 day of Paxlovid, will continue remainder of course while inpatient.  Wife will bring home supply.  Hypertension Patient on amlodipine and hydrochlorothiazide, normotensive. - Hold amlodipine, can resume if hypertensive - Discontinue hydrochlorothiazide  DVT Prophylaxis: Lovenox  Code Status: Full code  Family Communication: Discussed with patient and wife at the bedside this morning.  Disposition Plan: Home when medically stable on room air, likely 1 to 2 days.  Consultants: None  Procedures: None  Antimicrobials: Anti-infectives (From admission, onward)    Start     Dose/Rate Route Frequency Ordered Stop   12/15/22 1000  nirmatrelvir/ritonavir (PAXLOVID) 3 tablet       Note to Pharmacy: Patient to take home supply once approved by pharmacy. (Take nirmatrelvir 150 mg two tablets twice daily for 5 days and ritonavir 100 mg one tablet twice daily for 5 days)   3 tablet Oral 2 times daily 12/15/22 0834 12/20/22 0959       Objective: Vitals:   12/15/22 0500 12/15/22 0630 12/15/22 0739 12/15/22 0800  BP: 123/66 119/61  113/60  Pulse: (!) 51 (!) 48  (!) 50  Resp: '18 18  20  '$ Temp:  98 F (36.7 C) 98.6 F (37 C)   TempSrc:   Oral   SpO2: 93% 94%  91%  Weight:      Height:        Intake/Output Summary (Last 24  hours) at 12/15/2022 0834 Last data filed at 12/14/2022 1123 Gross per 24 hour  Intake 622.92 ml  Output --  Net 622.92 ml   Filed Weights   12/14/22 0256  Weight: 96.3 kg   Exam: General:  Alert, oriented, calm, in no acute distress, on 2 L nasal cannula oxygen Eyes: EOMI, clear sclerea Neck: supple, no masses, trachea mildline  Cardiovascular: RRR, no murmurs or rubs, no peripheral edema   Respiratory: clear to auscultation bilaterally, no wheezes, no crackles  Abdomen: soft, nontender, nondistended, normal bowel tones heard  Skin: dry, no rashes  Musculoskeletal: no joint effusions, normal range of motion  Psychiatric: appropriate affect, normal speech  Neurologic: extraocular muscles intact, clear speech, moving all extremities with intact sensorium   Data Reviewed: CBC: Recent Labs  Lab 12/14/22 0308 12/15/22 0433  WBC 2.4* 5.2  HGB 14.1 13.6  HCT 38.3* 37.2*  MCV 87.8 88.6  PLT 82* 83*   Basic Metabolic Panel: Recent Labs  Lab 12/14/22 0307 12/14/22 0308 12/14/22 0308 12/14/22 0954 12/14/22 1420 12/14/22 1944 12/15/22 0100 12/15/22 0433  NA  --  118*   < > 118* 123* 125* 127* 128*  K  --  3.5  --  3.3* 3.4*  --   --  3.8  CL  --  85*  --  90* 91*  --   --  97*  CO2  --  20*  --  21* 23  --   --  23  GLUCOSE  --  193*  --  183* 138*  --   --  136*  BUN  --  20  --  17 16  --   --  14  CREATININE  --  0.75  --  0.71 0.75  --   --  0.75  CALCIUM  --  7.7*  --  7.0* 7.4*  --   --  7.6*  MG 1.6*  --   --   --   --   --   --   --    < > = values in this interval not displayed.   GFR: Estimated Creatinine Clearance: 88.6 mL/min (by C-G formula based on SCr of 0.75 mg/dL). Liver Function Tests: Recent Labs  Lab 12/14/22 0308 12/14/22 0954 12/14/22 1420 12/15/22 0433  AST 46* 39 43* 43*  ALT 47* 42 39 37  ALKPHOS 43 36* 36* 36*  BILITOT 1.2 0.9 0.8 0.8  PROT 6.3* 5.4* 5.4* 5.3*  ALBUMIN 3.3* 2.8* 2.8* 2.8*   Recent Labs  Lab 12/14/22 0308  LIPASE 27   No results for input(s): "AMMONIA" in the last 168 hours. Coagulation Profile: No results for input(s): "INR", "PROTIME" in the last 168 hours. Cardiac Enzymes: No results for input(s): "CKTOTAL", "CKMB", "CKMBINDEX", "TROPONINI" in the last 168 hours. BNP (last 3 results) No results for input(s): "PROBNP" in the last 8760 hours. HbA1C: No results for input(s): "HGBA1C" in the last 72  hours. CBG: Recent Labs  Lab 12/14/22 1202 12/14/22 1628 12/14/22 2155 12/15/22 0738  GLUCAP 166* 143* 146* 136*   Lipid Profile: No results for input(s): "CHOL", "HDL", "LDLCALC", "TRIG", "CHOLHDL", "LDLDIRECT" in the last 72 hours. Thyroid Function Tests: No results for input(s): "TSH", "T4TOTAL", "FREET4", "T3FREE", "THYROIDAB" in the last 72 hours. Anemia Panel: No results for input(s): "VITAMINB12", "FOLATE", "FERRITIN", "TIBC", "IRON", "RETICCTPCT" in the last 72 hours. Urine analysis:    Component Value Date/Time   COLORURINE YELLOW (A) 12/14/2022 0743   APPEARANCEUR HAZY (A) 12/14/2022  [place] 1.021 12/14/2022 0743   PHURINE 5.0 12/14/2022 0743   GLUCOSEU >=500 (A) 12/14/2022 0743   HGBUR NEGATIVE 12/14/2022 0743   BILIRUBINUR NEGATIVE 12/14/2022 0743   BILIRUBINUR neg 03/04/2016 1618   KETONESUR 20 (A) 12/14/2022 0743   PROTEINUR 30 (A) 12/14/2022 0743   UROBILINOGEN 0.2 03/04/2016 1618   NITRITE NEGATIVE 12/14/2022 0743   LEUKOCYTESUR NEGATIVE 12/14/2022 0743   Sepsis Labs: '@LABRCNTIP'$ (procalcitonin:4,lacticidven:4)  )No results found for this or any previous visit (from the past 240 hour(s)).   Studies: No results found.  Scheduled Meds:  atorvastatin  20 mg Oral Daily   enoxaparin (LOVENOX) injection  0.5 mg/kg Subcutaneous Q24H   insulin aspart  0-9 Units Subcutaneous TID WC   nirmatrelvir/ritonavir  3 tablet Oral BID   sodium chloride flush  3 mL Intravenous Q12H    Continuous Infusions:   LOS: 1 day   Time spent: 31 minutes  Alba Kriesel Marry Guan, MD [facility] Pager [phone]  If 7PM-7AM, please contact night-coverage [email or web address] Password [facility] 12/15/2022, 8:34 AM

## 2022-12-15 NOTE — ED Notes (Signed)
Attempted pt on room air, desatted down to 88-89%. Placed back on 2L [place]

## 2022-12-15 NOTE — ED Notes (Signed)
Pt's linen changed, wife assisting pt getting cleaned up. Will obtain temp when pt is cleaned up

## 2022-12-16 DIAGNOSIS — E871 Hypo-osmolality and hyponatremia: Secondary | ICD-10-CM | POA: Diagnosis not present

## 2022-12-16 LAB — SODIUM
Sodium: 134 mmol/L — ABNORMAL LOW (ref 135–145)
Sodium: 136 mmol/L (ref 135–145)
Sodium: 131 mmol/L — ABNORMAL LOW (ref 135–145)

## 2022-12-16 LAB — GLUCOSE, CAPILLARY
Glucose-Capillary: 109 mg/dL — ABNORMAL HIGH (ref 70–99)
Glucose-Capillary: 204 mg/dL — ABNORMAL HIGH (ref 70–99)

## 2022-12-16 NOTE — Progress Notes (Signed)
  Transition of Care [facility]) Screening Note   Patient Details  Name: Richard Hardy Date of Birth: June 17, 1946   Transition of Care [facility]) CM/SW Contact:    Tiburcio Bash, LCSW Phone Number: 12/16/2022, 1:08 PM    Transition of Care Department [facility]) has reviewed patient and no TOC needs have been identified at this time. We will continue to monitor patient advancement through interdisciplinary progression rounds. If new patient transition needs arise, please place a TOC consult.  Kelby Fam, [place], MSW, [place]

## 2022-12-16 NOTE — Discharge Summary (Addendum)
Discharge Summary  Richard Hardy [id number] DOB: Oct 05, 1946  PCP: Crecencio Mc, MD  Admit date: 12/14/2022 Discharge date: 12/16/2022  Recommendations for Outpatient Follow-up:  Please follow up with your PCP with repeat lab work including sodium level within the next 7 days.  Discharge Diagnoses:  Active Hospital Problems   Diagnosis Date Noted   Hyponatremia 12/14/2022   Sleep apnea 12/14/2022   Bicytopenia 12/14/2022   Transaminitis 12/14/2022   Known medical problems 12/14/2022   COVID-19 12/12/2022   Abdominal aortic atherosclerosis ([place]) 08/21/2020   Benign positional vertigo 10/02/2017   Hypertension 07/21/2013   Hyperglycemia due to Diabetes type 2, controlled ([place]) 06/05/2011    Resolved Hospital Problems  No resolved problems to display.   Discharge Condition: Stable   Diet recommendation: Diet Orders (From admission, onward)     Start     Ordered   12/14/22 0813  Diet heart healthy/carb modified Room service appropriate? Yes; Fluid consistency: Thin; Fluid restriction: 1200 mL Fluid  Diet effective now       Question Answer Comment  Diet-HS Snack? Nothing   Room service appropriate? Yes   Fluid consistency: Thin   Fluid restriction: 1200 mL Fluid      12/14/22 0813           HPI and Brief Hospital Course:  This is a pleasant 77 year old gentleman with a history of hypertension, CVD, non-insulin-dependent type 2 diabetes, obstructive sleep apnea, BPPV who presented with nausea vomiting diarrhea in the setting of recently diagnosed COVID infection.  He had been started on Paxlovid as an outpatient, he was evaluated in the emergency department due to his nausea vomiting diarrhea, found to have significant hyponatremia with sodium level 118 and was admitted to the hospitalist service.  His mental status was normal.  Due to worsening sodium levels, he was given an infusion of hypertonic saline and sodium improved over the last 48 hours.  His sodium did not  increase by more than 10 mmol/L in a 24-hour period.  His nausea vomiting has resolved, his home Paxlovid was resumed, and this morning he remained stable with improved sodium levels.  He has been on room air during his entire hospital stay, without any pulmonary symptoms.  As such, the patient is stable and ready for discharge home with his wife today.  Plan of care discussed in detail with the patient and his wife at the bedside, all questions were answered to their satisfaction and they are agreeable to discharge home today.  Patient and his wife were advised that he should have repeat blood work done within the next 7 days with his primary care provider, to include a BMP.  In the meantime, I am holding his hydrochlorothiazide and Lasix.  This can likely be safely resumed once stabilization of his sodium levels is confirmed.  Procedures: None  Consultations: None  Discharge details, plan of care and follow up instructions were discussed with patient and any available family or care providers. Patient and family are in agreement with discharge from the hospital today and all questions were answered to their satisfaction.  Discharge Exam: BP 122/71 (BP Location: Right Arm)   Pulse (!) 56   Temp 98.4 F (36.9 C) (Oral)   Resp 20   Ht '5\' 10"'$  (1.778 m)   Wt 96.3 kg   SpO2 93%   BMI 30.46 kg/m  General:  Alert, oriented, calm, in no acute distress  Eyes: EOMI, clear sclerea Neck: supple, no masses, trachea mildline  Cardiovascular: RRR, no murmurs or rubs, no peripheral edema  Respiratory: clear to auscultation bilaterally, no wheezes, no crackles  Abdomen: soft, nontender, nondistended, normal bowel tones heard  Skin: dry, no rashes  Musculoskeletal: no joint effusions, normal range of motion  Psychiatric: appropriate affect, normal speech  Neurologic: extraocular muscles intact, clear speech, moving all extremities with intact sensorium   Discharge Instructions You were cared for by a  hospitalist during your hospital stay. If you have any questions about your discharge medications or the care you received while you were in the hospital after you are discharged, you can call the unit and asked to speak with the hospitalist on call if the hospitalist that took care of you is not available. Once you are discharged, your primary care physician will handle any further medical issues. Please note that NO REFILLS for any discharge medications will be authorized once you are discharged, as it is imperative that you return to your primary care physician (or establish a relationship with a primary care physician if you do not have one) for your aftercare needs so that they can reassess your need for medications and monitor your lab values.   Allergies as of 12/16/2022       Reactions   Losartan    Hyperkalemia   Lisinopril    cough   Metformin And Related    Stomach aches. Has been tolerating 500 mg once daily   Sulfonamide Derivatives    REACTION: hives   Augmentin [amoxicillin-pot Clavulanate] Rash        Medication List     STOP taking these medications    furosemide 20 MG tablet Commonly known as: LASIX   hydrochlorothiazide 25 MG tablet Commonly known as: HYDRODIURIL       TAKE these medications    amLODipine 5 MG tablet Commonly known as: NORVASC Take 1 tablet (5 mg total) by mouth daily.   ascorbic acid 500 MG tablet Commonly known as: VITAMIN C Take 500 mg by mouth daily.   atorvastatin 20 MG tablet Commonly known as: LIPITOR Take 1 tablet (20 mg total) by mouth daily.   chlorhexidine 0.12 % solution Commonly known as: PERIDEX SMARTSIG:0.5 Ounce(s) By Mouth Twice Daily   CINNAMON PO Take 2 capsules by mouth daily.   CoQ10 200 MG Caps Take 1 tablet by mouth daily.   Fish Oil Oil Take by mouth daily.   fluticasone 50 MCG/ACT nasal spray Commonly known as: FLONASE Place into the nose at bedtime.   glipiZIDE 5 MG tablet Commonly known as:  GLUCOTROL Take 1 tablet (5 mg total) by mouth 2 (two) times daily before a meal.   glucosamine-chondroitin 500-400 MG tablet Take 1 tablet by mouth in the morning and at bedtime.   MAGNESIUM CITRATE PO Take 1 capsule by mouth in the morning and at bedtime.   metFORMIN 500 MG 24 hr tablet Commonly known as: GLUCOPHAGE-XR TAKE 1 TABLET BY MOUTH EVERY DAY WITH BREAKFAST   multivitamin capsule Take 1 capsule by mouth daily.   mupirocin ointment 2 % Commonly known as: BACTROBAN Apply 1 application topically 2 (two) times daily.   naproxen 250 MG tablet Commonly known as: NAPROSYN PLEASE SEE ATTACHED FOR DETAILED DIRECTIONS   nirmatrelvir/ritonavir 20 x 150 MG & 10 x '100MG'$  Tabs Commonly known as: PAXLOVID Take 3 tablets by mouth 2 (two) times daily for 5 days. (Take nirmatrelvir 150 mg two tablets twice daily for 5 days and ritonavir 100 mg one tablet twice daily for  5 days) Patient GFR is 76   OneTouch Delica Lancets 75Z Misc Use to check blood sugars once daily.   OneTouch Verio IQ System w/Device Kit Use to check blood sugars once daily.   OneTouch Verio test strip Generic drug: glucose blood USE TO CHECK SUGARS ONCE OR TWICE DAILY .   Turmeric 500 MG Tabs Take 1 tablet by mouth daily.       Allergies  Allergen Reactions   Losartan     Hyperkalemia    Lisinopril     cough   Metformin And Related     Stomach aches. Has been tolerating 500 mg once daily   Sulfonamide Derivatives     REACTION: hives   Augmentin [Amoxicillin-Pot Clavulanate] Rash    Follow-up Information     Crecencio Mc, MD Follow up in 1 week(s).   Specialty: Internal Medicine Why: With repeat BMP to check Na level Contact information: [address] [place] [address] [phone]                 The results of significant diagnostics from this hospitalization (including imaging, microbiology, ancillary and laboratory) are listed below for reference.     Significant Diagnostic Studies: No results found.  Microbiology: No results found for this or any previous visit (from the past 240 hour(s)).   Labs: Basic Metabolic Panel: Recent Labs  Lab 12/14/22 0307 12/14/22 0308 12/14/22 0308 12/14/22 0954 12/14/22 1420 12/14/22 1944 12/15/22 0433 12/15/22 0912 12/15/22 1805 12/15/22 2102 12/16/22 0046 12/16/22 0648 12/16/22 1059  NA  --  118*   < > 118* 123*   < > 128*   < > 131* 133* 134* 136 131*  K  --  3.5  --  3.3* 3.4*  --  3.8  --   --   --   --   --   --   CL  --  85*  --  90* 91*  --  97*  --   --   --   --   --   --   CO2  --  20*  --  21* 23  --  23  --   --   --   --   --   --   GLUCOSE  --  193*  --  183* 138*  --  136*  --   --   --   --   --   --   BUN  --  20  --  17 16  --  14  --   --   --   --   --   --   CREATININE  --  0.75  --  0.71 0.75  --  0.75  --   --   --   --   --   --   CALCIUM  --  7.7*  --  7.0* 7.4*  --  7.6*  --   --   --   --   --   --   MG 1.6*  --   --   --   --   --   --   --   --   --   --   --   --    < > = values in this interval not displayed.   Liver Function Tests: Recent Labs  Lab 12/14/22 0308 12/14/22 0954 12/14/22 1420 12/15/22 0433  AST 46* 39 43* 43*  ALT 47* 42  39 37  ALKPHOS 43 36* 36* 36*  BILITOT 1.2 0.9 0.8 0.8  PROT 6.3* 5.4* 5.4* 5.3*  ALBUMIN 3.3* 2.8* 2.8* 2.8*   Recent Labs  Lab 12/14/22 0308  LIPASE 27   No results for input(s): "AMMONIA" in the last 168 hours. CBC: Recent Labs  Lab 12/14/22 0308 12/15/22 0433  WBC 2.4* 5.2  HGB 14.1 13.6  HCT 38.3* 37.2*  MCV 87.8 88.6  PLT 82* 83*   Cardiac Enzymes: No results for input(s): "CKTOTAL", "CKMB", "CKMBINDEX", "TROPONINI" in the last 168 hours. BNP: BNP (last 3 results) No results for input(s): "BNP" in the last 8760 hours.  ProBNP (last 3 results) No results for input(s): "PROBNP" in the last 8760 hours.  CBG: Recent Labs  Lab 12/15/22 1148 12/15/22 1615 12/15/22 2210 12/16/22 0753  12/16/22 1123  GLUCAP 141* 124* 92 109* 204*    Time spent: > 30 minutes were spent in preparing this discharge including medication reconciliation, counseling, and coordination of care.  Signed:  Shanele Nissan Marry Guan, MD  [facility] 12/16/2022, 12:44 PM

## 2022-12-17 ENCOUNTER — Telehealth: Payer: Self-pay | Admitting: *Deleted

## 2022-12-17 NOTE — Patient Outreach (Signed)
  Care Coordination [facility] Note Transition Care Management Follow-up Telephone Call Date of discharge and from where: [facility] [phone] How have you been since you were released from the hospital? It is a slow progress. I can shave, wash my face. Eat a little toast then I rest a while.  Any questions or concerns? No  Items Reviewed: Did the pt receive and understand the discharge instructions provided? Yes  Medications obtained and verified? Yes RN made sure patient stopped the furosemide and HCTZ as per Dr ordered Other? No  Any new allergies since your discharge? No  Dietary orders reviewed? No Do you have support at home? Yes   Home Care and Equipment/Supplies: Were home health services ordered? no If so, what is the name of the agency? N/a  Has the agency set up a time to come to the patient's home? not applicable Were any new equipment or medical supplies ordered?  No What is the name of the medical supply agency? N/a Were you able to get the supplies/equipment? no Do you have any questions related to the use of the equipment or supplies? No  Functional Questionnaire: (I = Independent and D = Dependent) ADLs: I  Bathing/Dressing- I  Meal Prep- D  Eating- I  Maintaining continence- I  Transferring/Ambulation- I  Managing Meds- I  Follow up appointments reviewed:  PCP Hospital f/u appt confirmed? Yes  Dr Derrel Nip [phone] 11:00. [facility] f/u appt confirmed? No  . Are transportation arrangements needed? No  If their condition worsens, is the pt aware to call PCP or go to the Emergency Dept.? Yes Was the patient provided with contact information for the PCP's office or ED? Yes Was to pt encouraged to call back with questions or concerns? Yes  SDOH assessments and interventions completed:   Yes  SDOH Screenings   Food Insecurity: No Food Insecurity (12/17/2022)  Housing: Low Risk  (12/15/2022)  Transportation Needs: No Transportation Needs (12/17/2022)  Utilities: Not  At Risk (12/15/2022)  Depression (PHQ2-9): Low Risk  (11/11/2022)  Financial Resource Strain: Low Risk  (11/07/2021)  Physical Activity: Sufficiently Active (11/07/2021)  Social Connections: Unknown (11/07/2021)  Stress: No Stress Concern Present (11/07/2021)  Tobacco Use: Medium Risk (12/14/2022)     Care Coordination Interventions:  No Care Coordination interventions needed at this time.   Encounter Outcome:  Pt. Visit Completed    [place] [facility] [phone]

## 2022-12-23 ENCOUNTER — Ambulatory Visit (INDEPENDENT_AMBULATORY_CARE_PROVIDER_SITE_OTHER): Payer: Medicare HMO

## 2022-12-23 ENCOUNTER — Encounter: Payer: Self-pay | Admitting: Internal Medicine

## 2022-12-23 ENCOUNTER — Ambulatory Visit (INDEPENDENT_AMBULATORY_CARE_PROVIDER_SITE_OTHER): Payer: Medicare HMO | Admitting: Internal Medicine

## 2022-12-23 VITALS — BP 116/68 | HR 72 | Temp 97.6°F | Ht 70.0 in | Wt 208.2 lb

## 2022-12-23 DIAGNOSIS — R7401 Elevation of levels of liver transaminase levels: Secondary | ICD-10-CM | POA: Diagnosis not present

## 2022-12-23 DIAGNOSIS — E871 Hypo-osmolality and hyponatremia: Secondary | ICD-10-CM

## 2022-12-23 DIAGNOSIS — U071 COVID-19: Secondary | ICD-10-CM

## 2022-12-23 DIAGNOSIS — R918 Other nonspecific abnormal finding of lung field: Secondary | ICD-10-CM | POA: Diagnosis not present

## 2022-12-23 DIAGNOSIS — E1169 Type 2 diabetes mellitus with other specified complication: Secondary | ICD-10-CM

## 2022-12-23 DIAGNOSIS — E785 Hyperlipidemia, unspecified: Secondary | ICD-10-CM

## 2022-12-23 DIAGNOSIS — J189 Pneumonia, unspecified organism: Secondary | ICD-10-CM | POA: Diagnosis not present

## 2022-12-23 DIAGNOSIS — U099 Post covid-19 condition, unspecified: Secondary | ICD-10-CM | POA: Diagnosis not present

## 2022-12-23 DIAGNOSIS — I700 Atherosclerosis of aorta: Secondary | ICD-10-CM | POA: Diagnosis not present

## 2022-12-23 LAB — HEPATIC FUNCTION PANEL
ALT: 57 U/L — ABNORMAL HIGH (ref 0–53)
AST: 27 U/L (ref 0–37)
Albumin: 3.8 g/dL (ref 3.5–5.2)
Alkaline Phosphatase: 52 U/L (ref 39–117)
Bilirubin, Direct: 0.1 mg/dL (ref 0.0–0.3)
Total Bilirubin: 0.6 mg/dL (ref 0.2–1.2)
Total Protein: 6.4 g/dL (ref 6.0–8.3)

## 2022-12-23 LAB — BASIC METABOLIC PANEL
BUN: 15 mg/dL (ref 6–23)
CO2: 31 mEq/L (ref 19–32)
Calcium: 9.2 mg/dL (ref 8.4–10.5)
Chloride: 97 mEq/L (ref 96–112)
Creatinine, Ser: 1 mg/dL (ref 0.40–1.50)
GFR: 71.89 mL/min (ref >60.00)
Glucose, Bld: 300 mg/dL — ABNORMAL HIGH (ref 70–99)
Potassium: 4.6 mEq/L (ref 3.5–5.1)
Sodium: 136 mEq/L (ref 135–145)

## 2022-12-23 NOTE — Assessment & Plan Note (Signed)
He has resumed statin , which was stopped (presumedly bc of liver enzyme elevation )

## 2022-12-23 NOTE — Patient Instructions (Signed)
I'm glad you are feeling better!  If your sodium is still low,  you will need to stop the hctz   You will have antibodies to COVID for at least 3 months

## 2022-12-23 NOTE — Assessment & Plan Note (Signed)
Given his report of cough productive of blood streaked sputum during COVID infection, need to document any infiltrate with chest x ray: he has bilateral infiltrates seen c/w atypical pneumonia.  Azithromycin prescribed. Daily use of a probiotic advised for 3 weeks.

## 2022-12-23 NOTE — Assessment & Plan Note (Signed)
His statin was suspended while taking paxlovid

## 2022-12-23 NOTE — Assessment & Plan Note (Signed)
Secondary to recurrent emesis and diarrhea in the setting of chronic hctz use. .  Sodium of 118 was corrected to 131 over 24 hours using hypertonic saline and he has resumed use of hctz .  Lab Results  Component Value Date   NA 131 (L) 12/16/2022   K 3.8 12/15/2022   CL 97 (L) 12/15/2022   CO2 23 12/15/2022

## 2022-12-23 NOTE — Progress Notes (Signed)
Subjective:  Patient ID: Richard Hardy, male    DOB: 04/08/1946  Age: 77 y.o. MRN: [id number]  CC: The primary encounter diagnosis was Hyponatremia. Diagnoses of COVID-19 virus infection, Transaminitis, Hyperlipidemia due to type 2 diabetes mellitus ([place]), and Abdominal aortic atherosclerosis ([place]) were also pertinent to this visit.   HPI Richard Hardy presents for  Chief Complaint  Patient presents with   Hospitalization Follow-up   77 yr old male with type 2 DM , hypertension managed with hctz admitted to [facility] on Jan 6 with profound dehydration and hyponatremia secondary  to COVID infection .  Patient had been treated as an outpatient with Paxlovid, but developed uncontrolled vomiting described as "projective vomiting " as well as diarrhea.     He required hypertonic saline infusions for 24 hours for admission sodium of 118 .     He reports that Once hospitalized he started having cough productive of  thick mucous that was red tinged but no chest x ray was done .   Sodium was corrected to 131 over 24 hour period with hyperteonic saline and serial monitoring every 6 hours    Furosemide, atorvastatin  and hctz were stopped at discharge on Jan 7 . He has resumed hctz and atorvastatin since finishing paxlovid.  He only uses furosemide prn and has not used it since d/c.   Has been home since Jan 7, felt pretty rough at first ,  fatigued and weak,  but states that he has been gaining strength  each day. Nausea has resolved .  Appetite has returned and he is  eating regularly and walking around the house.   Outpatient Medications Prior to Visit  Medication Sig Dispense Refill   amLODipine (NORVASC) 5 MG tablet Take 1 tablet (5 mg total) by mouth daily. 90 tablet 3   atorvastatin (LIPITOR) 20 MG tablet Take 1 tablet (20 mg total) by mouth daily. 90 tablet 3   Blood Glucose Monitoring Suppl (ONETOUCH VERIO IQ SYSTEM) w/Device KIT Use to check blood sugars once daily. 1 kit 0   chlorhexidine  (PERIDEX) 0.12 % solution SMARTSIG:0.5 Ounce(s) By Mouth Twice Daily     CINNAMON PO Take 2 capsules by mouth daily.     Coenzyme Q10 (COQ10) 200 MG CAPS Take 1 tablet by mouth daily.     Fish Oil OIL Take by mouth daily.     fluticasone (FLONASE) 50 MCG/ACT nasal spray Place into the nose at bedtime.     glipiZIDE (GLUCOTROL) 5 MG tablet Take 1 tablet (5 mg total) by mouth 2 (two) times daily before a meal. 180 tablet 0   glucosamine-chondroitin 500-400 MG tablet Take 1 tablet by mouth in the morning and at bedtime.     MAGNESIUM CITRATE PO Take 1 capsule by mouth in the morning and at bedtime.     metFORMIN (GLUCOPHAGE-XR) 500 MG 24 hr tablet TAKE 1 TABLET BY MOUTH EVERY DAY WITH BREAKFAST 90 tablet 1   Multiple Vitamin (MULTIVITAMIN) capsule Take 1 capsule by mouth daily.     mupirocin ointment (BACTROBAN) 2 % Apply 1 application topically 2 (two) times daily. 30 g 2   naproxen (NAPROSYN) 250 MG tablet PLEASE SEE ATTACHED FOR DETAILED DIRECTIONS     ONETOUCH DELICA LANCETS 96E MISC Use to check blood sugars once daily. 100 each 1   ONETOUCH VERIO test strip USE TO CHECK SUGARS ONCE OR TWICE DAILY . 100 strip 1   Turmeric 500 MG TABS Take 1 tablet by  mouth daily.     vitamin C (ASCORBIC ACID) 500 MG tablet Take 500 mg by mouth daily.     No facility-administered medications prior to visit.    Review of Systems;  Patient denies headache, fevers, malaise, unintentional weight loss, skin rash, eye pain, sinus congestion and sinus pain, sore throat, dysphagia,  hemoptysis , cough, dyspnea, wheezing, chest pain, palpitations, orthopnea, edema, abdominal pain, nausea, melena, diarrhea, constipation, flank pain, dysuria, hematuria, urinary  Frequency, nocturia, numbness, tingling, seizures,  Focal weakness, Loss of consciousness,  Tremor, insomnia, depression, anxiety, and suicidal ideation.      Objective:  BP 116/68   Pulse 72   Temp 97.6 F (36.4 C) (Oral)   Ht '5\' 10"'$  (1.778 m)   Wt 208  lb 3.2 oz (94.4 kg)   SpO2 96%   BMI 29.87 kg/m   BP Readings from Last 3 Encounters:  12/23/22 116/68  12/16/22 122/71  11/11/22 128/70    Wt Readings from Last 3 Encounters:  12/23/22 208 lb 3.2 oz (94.4 kg)  12/14/22 212 lb 4.9 oz (96.3 kg)  11/11/22 209 lb 12.8 oz (95.2 kg)    Physical Exam Vitals reviewed.  Constitutional:      General: He is not in acute distress.    Appearance: Normal appearance. He is normal weight. He is not ill-appearing, toxic-appearing or diaphoretic.  HENT:     Head: Normocephalic.  Eyes:     General: No scleral icterus.       Right eye: No discharge.        Left eye: No discharge.     Conjunctiva/sclera: Conjunctivae normal.  Cardiovascular:     Rate and Rhythm: Normal rate and regular rhythm.     Heart sounds: Normal heart sounds.  Pulmonary:     Effort: Pulmonary effort is normal. No respiratory distress.     Breath sounds: Normal breath sounds.  Musculoskeletal:        General: Normal range of motion.     Cervical back: Normal range of motion.  Skin:    General: Skin is warm and dry.  Neurological:     General: No focal deficit present.     Mental Status: He is alert and oriented to person, place, and time. Mental status is at baseline.  Psychiatric:        Mood and Affect: Mood normal.        Behavior: Behavior normal.        Thought Content: Thought content normal.        Judgment: Judgment normal.     Lab Results  Component Value Date   HGBA1C 6.9 (H) 11/11/2022   HGBA1C 7.0 (H) 05/10/2022   HGBA1C 6.6 (H) 11/09/2021    Lab Results  Component Value Date   CREATININE 0.75 12/15/2022   CREATININE 0.75 12/14/2022   CREATININE 0.71 12/14/2022    Lab Results  Component Value Date   WBC 5.2 12/15/2022   HGB 13.6 12/15/2022   HCT 37.2 (L) 12/15/2022   PLT 83 (L) 12/15/2022   GLUCOSE 136 (H) 12/15/2022   CHOL 106 11/11/2022   TRIG 76.0 11/11/2022   HDL 45.50 11/11/2022   LDLDIRECT 55.0 05/10/2022   LDLCALC 45  11/11/2022   ALT 37 12/15/2022   AST 43 (H) 12/15/2022   NA 131 (L) 12/16/2022   K 3.8 12/15/2022   CL 97 (L) 12/15/2022   CREATININE 0.75 12/15/2022   BUN 14 12/15/2022   CO2 23 12/15/2022   TSH 2.46  11/11/2022   PSA 0.32 11/10/2019   HGBA1C 6.9 (H) 11/11/2022   MICROALBUR 1.1 05/10/2022    No results found.  Assessment & Plan:  .Hyponatremia Assessment & Plan: Secondary to recurrent emesis and diarrhea in the setting of chronic hctz use. .  Sodium of 118 was corrected to 131 over 24 hours using hypertonic saline and he has resumed use of hctz .  Lab Results  Component Value Date   NA 131 (L) 12/16/2022   K 3.8 12/15/2022   CL 97 (L) 12/15/2022   CO2 23 12/15/2022     Orders: -     Basic metabolic panel  DVVOH-60 virus infection Assessment & Plan: Given his report of cough productive of blood streaked sputum during COVID infection, need to document any infiltrate for future reference.  Clinically resolved.   Orders: -     DG Chest 2 View; Future -     Hepatic function panel  Transaminitis Assessment & Plan: Noted during treatment with Paxlovid. Repeat due  Lab Results  Component Value Date   ALT 37 12/15/2022   AST 43 (H) 12/15/2022   ALKPHOS 36 (L) 12/15/2022   BILITOT 0.8 12/15/2022      Hyperlipidemia due to type 2 diabetes mellitus ([place]) Assessment & Plan: His statin was suspended while taking paxlovid    Abdominal aortic atherosclerosis ([facility]) Assessment & Plan: He has resumed statin , which was stopped (presumedly bc of liver enzyme elevation )      I provided 30 minutes of face-to-face time during this encounter reviewing patient's last visit with me, patient's  most recent visit with cardiology,  nephrology,  and neurology,  recent surgical and non surgical procedures, previous  labs and imaging studies, counseling on currently addressed issues,  and post visit ordering to diagnostics and therapeutics .   Follow-up: No follow-ups on  file.   Crecencio Mc, MD

## 2022-12-23 NOTE — Assessment & Plan Note (Signed)
Noted during treatment with Paxlovid. Repeat due  Lab Results  Component Value Date   ALT 37 12/15/2022   AST 43 (H) 12/15/2022   ALKPHOS 36 (L) 12/15/2022   BILITOT 0.8 12/15/2022

## 2022-12-24 MED ORDER — AZITHROMYCIN 500 MG PO TABS: 500.0000 mg | ORAL_TABLET | Freq: Every day | ORAL | 0 refills | Status: DC

## 2022-12-24 NOTE — Addendum Note (Signed)
Addended by: Crecencio Mc on: 12/24/2022 01:05 PM   Modules accepted: Orders

## 2022-12-31 ENCOUNTER — Encounter: Payer: Self-pay | Admitting: Internal Medicine

## 2023-01-02 ENCOUNTER — Other Ambulatory Visit: Payer: Self-pay | Admitting: Internal Medicine

## 2023-01-07 DIAGNOSIS — Z0184 Encounter for antibody response examination: Secondary | ICD-10-CM | POA: Diagnosis not present

## 2023-01-07 DIAGNOSIS — M531 Cervicobrachial syndrome: Secondary | ICD-10-CM | POA: Diagnosis not present

## 2023-01-07 DIAGNOSIS — M9905 Segmental and somatic dysfunction of pelvic region: Secondary | ICD-10-CM | POA: Diagnosis not present

## 2023-01-07 DIAGNOSIS — M5441 Lumbago with sciatica, right side: Secondary | ICD-10-CM | POA: Diagnosis not present

## 2023-01-07 DIAGNOSIS — M955 Acquired deformity of pelvis: Secondary | ICD-10-CM | POA: Diagnosis not present

## 2023-01-07 DIAGNOSIS — M9901 Segmental and somatic dysfunction of cervical region: Secondary | ICD-10-CM | POA: Diagnosis not present

## 2023-01-07 DIAGNOSIS — M9903 Segmental and somatic dysfunction of lumbar region: Secondary | ICD-10-CM | POA: Diagnosis not present

## 2023-01-20 DIAGNOSIS — D3131 Benign neoplasm of right choroid: Secondary | ICD-10-CM | POA: Diagnosis not present

## 2023-01-20 DIAGNOSIS — E119 Type 2 diabetes mellitus without complications: Secondary | ICD-10-CM | POA: Diagnosis not present

## 2023-01-20 DIAGNOSIS — Z961 Presence of intraocular lens: Secondary | ICD-10-CM | POA: Diagnosis not present

## 2023-01-20 LAB — HM DIABETES EYE EXAM

## 2023-01-24 ENCOUNTER — Ambulatory Visit (INDEPENDENT_AMBULATORY_CARE_PROVIDER_SITE_OTHER): Payer: Medicare HMO

## 2023-01-24 VITALS — Ht 70.0 in | Wt 208.0 lb

## 2023-01-24 DIAGNOSIS — Z0000 Encounter for general adult medical examination without abnormal findings: Secondary | ICD-10-CM | POA: Diagnosis not present

## 2023-01-24 NOTE — Progress Notes (Cosign Needed Addendum)
Subjective:   Richard Hardy is a 77 y.o. male who presents for Medicare Annual/Subsequent preventive examination.  Review of Systems    No ROS.  Medicare Wellness Virtual Visit.  Visual/audio telehealth visit, UTA vital signs.   See social history for additional risk factors.   Cardiac Risk Factors include: advanced age (>78mn, >>19women);male gender;diabetes mellitus     Objective:    Today's Vitals   01/24/23 0824  Weight: 208 lb (94.3 kg)  Height: 5' 10"$  (1.778 m)   Body mass index is 29.84 kg/m.     01/24/2023    8:32 AM 12/15/2022    7:45 AM 11/07/2021    8:37 AM 10/16/2020    1:09 PM 07/12/2020    2:14 PM 07/12/2019   10:25 AM 04/29/2018    8:35 AM  Advanced Directives  Does Patient Have a Medical Advance Directive? Yes No Yes Yes Yes Yes Yes  Type of AParamedicof APlacedoLiving will  H[place]ain[place]Living will Out of facility DNR (pink MOST or yellow form) HFanning SpringsLiving will HImperialLiving will [place]ilfordLiving will  Does patient want to make changes to medical advance directive? No - Patient declined  No - Patient declined  No - Patient declined No - Patient declined No - Patient declined  Copy of HGolden Trianglein Chart? Yes - validated most recent copy scanned in chart (See row information)  Yes - validated most recent copy scanned in chart (See row information)  Yes - validated most recent copy scanned in chart (See row information) Yes - validated most recent copy scanned in chart (See row information) Yes    Current Medications (verified) Outpatient Encounter Medications as of 01/24/2023  Medication Sig   amLODipine (NORVASC) 5 MG tablet Take 1 tablet (5 mg total) by mouth daily.   atorvastatin (LIPITOR) 20 MG tablet Take 1 tablet (20 mg total) by mouth daily.   Blood Glucose Monitoring Suppl (ONETOUCH VERIO IQ SYSTEM) w/Device KIT Use to check blood  sugars once daily.   chlorhexidine (PERIDEX) 0.12 % solution SMARTSIG:0.5 Ounce(s) By Mouth Twice Daily   CINNAMON PO Take 2 capsules by mouth daily.   Coenzyme Q10 (COQ10) 200 MG CAPS Take 1 tablet by mouth daily.   Fish Oil OIL Take by mouth daily.   fluticasone (FLONASE) 50 MCG/ACT nasal spray Place into the nose at bedtime.   glipiZIDE (GLUCOTROL) 5 MG tablet Take 1 tablet (5 mg total) by mouth 2 (two) times daily before a meal.   glucosamine-chondroitin 500-400 MG tablet Take 1 tablet by mouth in the morning and at bedtime.   MAGNESIUM CITRATE PO Take 1 capsule by mouth in the morning and at bedtime.   metFORMIN (GLUCOPHAGE-XR) 500 MG 24 hr tablet TAKE 1 TABLET BY MOUTH EVERY DAY WITH BREAKFAST   Multiple Vitamin (MULTIVITAMIN) capsule Take 1 capsule by mouth daily.   mupirocin ointment (BACTROBAN) 2 % Apply 1 application topically 2 (two) times daily.   naproxen (NAPROSYN) 250 MG tablet PLEASE SEE ATTACHED FOR DETAILED DIRECTIONS   ONETOUCH DELICA LANCETS [id number] Use to check blood sugars once daily.   ONETOUCH VERIO test strip USE TO CHECK SUGARS ONCE OR TWICE DAILY .   Turmeric 500 MG TABS Take 1 tablet by mouth daily.   vitamin C (ASCORBIC ACID) 500 MG tablet Take 500 mg by mouth daily.   [DISCONTINUED] azithromycin (ZITHROMAX) 500 MG tablet Take 1 tablet (500  mg total) by mouth daily.   No facility-administered encounter medications on file as of 01/24/2023.    Allergies (verified) Losartan, Lisinopril, Metformin and related, Sulfonamide derivatives, and Augmentin [amoxicillin-pot clavulanate]   History: Past Medical History:  Diagnosis Date   Arthritis    Basal cell carcinoma 02/02/2020   L prox med calf - ED&C    Diabetes mellitus without complication ([place]) [id number]   Dysrhythmia    Glucose intolerance (impaired glucose tolerance)    Hyperkalemia 05/21/2017   Secondary to ARB   Hyperlipidemia    Obesity    Sleep apnea    On BiPAP, Dr. Richardson Landry   Thrombocythemia    Dr.  Grayland Ormond   Past Surgical History:  Procedure Laterality Date   CATARACT EXTRACTION, BILATERAL     COLONOSCOPY WITH PROPOFOL N/A 06/23/2017   Procedure: COLONOSCOPY WITH PROPOFOL;  Surgeon: Manya Silvas, MD;  [facility] ENDOSCOPY;  Service: Endoscopy;  Laterality: N/A;   COLONOSCOPY WITH PROPOFOL N/A 10/16/2020   Procedure: COLONOSCOPY WITH PROPOFOL;  Surgeon: Toledo, Benay Pike, MD;  Location: [facility] ENDOSCOPY;  Service: Gastroenterology;  Laterality: N/A;   EYE SURGERY     LITHOTRIPSY     Dr. Bernardo Heater   NASAL SEPTUM SURGERY     TONSILLECTOMY     Family History  Problem Relation Age of Onset   Cancer Mother        brain   Heart attack Father 18   Heart disease Father    Heart disease Brother        s/p CABG   Crohn's disease Brother    Hyperlipidemia Son    Hypertension Son    Sleep apnea Son    Thyroid disease Son    Heart disease Paternal Grandfather    Social History   Socioeconomic History   Marital status: Married    Spouse name: Not on file   Number of children: Not on file   Years of education: Not on file   Highest education level: Not on file  Occupational History   Not on file  Tobacco Use   Smoking status: Former    Packs/day: 0.50    Years: 4.00    Total pack years: 2.00    Types: Cigarettes    Quit date: 12/10/1963    Years since quitting: 59.1   Smokeless tobacco: Never  Vaping Use   Vaping Use: Never used  Substance and Sexual Activity   Alcohol use: Yes    Comment: Occasional-approx 4 beers per month   Drug use: No   Sexual activity: Not on file  Other Topics Concern   Not on file  Social History Narrative   Lives in [place] with wife. 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      Diet - regular, healthy      Exercise - walks with work and goes to the [employer]: Low Risk  (01/24/2023)   Overall Financial Resource Strain (CARDIA)     Difficulty of Paying Living Expenses: Not hard at all  Food Insecurity: No Food Insecurity (01/24/2023)   Hunger Vital Sign    Worried About Running Out of Food in the Last Year: Never true    [place] in the Last Year: Never true  Transportation Needs: No Transportation Needs (01/24/2023)   PRAPARE - Hydrologist (Medical): No  Lack of Transportation (Non-Medical): No  Physical Activity: Sufficiently Active (01/24/2023)   Exercise Vital Sign    Days of Exercise per Week: 5 days    Minutes of Exercise per Session: 30 min  Stress: No Stress Concern Present (01/24/2023)   [place]    Feeling of Stress : Not at all  Social Connections: Unknown (01/24/2023)   Social Connection and Isolation Panel [NHANES]    Frequency of Communication with Friends and Family: More than three times a week    Frequency of Social Gatherings with Friends and Family: Not on file    Attends Religious Services: Not on file    Active Member of Clubs or Organizations: Yes    Attends Archivist Meetings: More than 4 times per year    Marital Status: Married    Tobacco Counseling Counseling given: Not Answered   Clinical Intake:  Pre-visit preparation completed: Yes        Diabetes: Yes (Follwed by PCP)  How often do you need to have someone help you when you read instructions, pamphlets, or other written materials from your doctor or pharmacy?: 1 - Never  Nutrition Risk Assessment: Does the patient have any non-healing wounds?  No  Has the patient had any unintentional weight loss or weight gain?  No   Diabetes: Is the patient diabetic?  Yes  If diabetic, was a CBG obtained today?  No  Did the patient bring in their glucometer from home?  No  How often do you monitor your CBG's? daily.   Financial Strains and Diabetes Management: Are you having any financial strains with the device,  your supplies or your medication? No .  Does the patient want to be seen by Chronic Care Management for management of their diabetes?  No  Would the patient like to be referred to a Nutritionist or for Diabetic Management?  No     Interpreter Needed?: No      Activities of Daily Living    01/24/2023    8:27 AM 12/15/2022    5:52 PM  In your present state of health, do you have any difficulty performing the following activities:  Hearing? 0   Vision? 0   Difficulty concentrating or making decisions? 0   Walking or climbing stairs? 0   Dressing or bathing? 0   Doing errands, shopping? 0 0  Preparing Food and eating ? N   Using the Toilet? N   In the past six months, have you accidently leaked urine? N   Do you have problems with loss of bowel control? N   Managing your Medications? N   Managing your Finances? Y   Comment Wife manages   Housekeeping or managing your Housekeeping? N     Patient Care Team: Crecencio Mc, MD as PCP - General (Internal Medicine) Minna Merritts, MD as PCP - Cardiology (Cardiology) Jackolyn Confer, MD (Internal Medicine) Minna Merritts, MD as Consulting Physician (Cardiology)  Indicate any recent Medical Services you may have received from other than [facility] providers in the past year (date may be approximate).     Assessment:   This is a routine wellness examination for [facility].  I connected with  Richard Hardy on 01/24/23 by a audio enabled telemedicine application and verified that I am speaking with the correct person using two identifiers.  Patient Location: Home  Provider Location: Office/Clinic  I discussed the limitations of evaluation and management  by telemedicine. The patient expressed understanding and agreed to proceed.   Hearing/Vision screen Hearing Screening - Comments:: Patient is able to hear conversational tones without difficulty. No issues reported. Vision Screening - Comments:: Followed by [facility],  Dr. George Ina Wears reading glasses  No retinopathy Cataract extraction, bilateral They have seen their ophthalmologist in the last 12 months.    Dietary issues and exercise activities discussed: Current Exercise Habits: Home exercise routine, Type of exercise: walking (golfing), Time (Minutes): 30, Frequency (Times/Week): 5, Weekly Exercise (Minutes/Week): 150, Intensity: Mild Low carb diet Good water intake   Goals Addressed               This Visit's Progress     Patient Stated     Weight (lb) < 200 lb (90.7 kg) (pt-stated)   208 lb (94.3 kg)     Lose about 10 lbs in weight Lower A1C         Depression Screen    01/24/2023    8:31 AM 12/23/2022   11:17 AM 11/11/2022    8:13 AM 11/09/2021    9:28 AM 11/07/2021    8:34 AM 05/10/2021    9:27 AM 07/12/2020    2:12 PM  PHQ 2/9 Scores  PHQ - 2 Score 0 0 0 0 0 0 0  PHQ- 9 Score      0     Fall Risk    01/24/2023    8:32 AM 12/23/2022   11:16 AM 11/11/2022    8:13 AM 11/09/2021    9:28 AM 11/07/2021    8:38 AM  Fall Risk   Falls in the past year? 0 0 0 0 0  Number falls in past yr: 0 0 0  0  Injury with Fall? 0 0 0  0  Risk for fall due to :  No Fall Risks No Fall Risks No Fall Risks   Follow up Falls evaluation completed;Falls prevention discussed Falls evaluation completed Falls evaluation completed Falls evaluation completed Falls evaluation completed    FALL RISK PREVENTION PERTAINING TO THE HOME: Home free of loose throw rugs in walkways, pet beds, electrical cords, etc? Yes  Adequate lighting in your home to reduce risk of falls? Yes   ASSISTIVE DEVICES UTILIZED TO PREVENT FALLS: Life alert? No  Use of a cane, walker or w/c? No   TIMED UP AND GO: Was the test performed? No .   Cognitive Function:    04/29/2018    8:50 AM 04/28/2017    8:39 AM  MMSE - Mini Mental State Exam  Orientation to time 5 5  Orientation to Place 5 5  Registration 3 3  Attention/ Calculation 5 5  Recall 3 3  Language- name 2  objects 2 2  Language- repeat 1 1  Language- follow 3 step command 3 3  Language- read & follow direction 1 1  Write a sentence 1 1  Copy design 1 1  Total score 30 30        01/24/2023    8:33 AM 07/12/2020    2:18 PM 07/12/2019   10:26 AM  6CIT Screen  What Year? 0 points 0 points 0 points  What month? 0 points 0 points 0 points  What time? 0 points  0 points  Count back from 20 0 points  0 points  Months in reverse 0 points 0 points 0 points  Repeat phrase 0 points  0 points  Total Score 0 points  0 points  Immunizations Immunization History  Administered Date(s) Administered   Fluad Quad(high Dose 65+) 11/20/2020, 11/11/2022   Influenza, High Dose Seasonal PF 11/20/2016, 09/29/2017, 09/30/2018, 10/07/2019, 10/05/2021   Influenza,inj,Quad PF,6+ Mos 08/26/2014, 08/29/2015   Influenza-Unspecified 09/20/2012, 09/24/2013, 09/22/2021   PFIZER(Purple Top)SARS-COV-2 Vaccination 01/03/2020, 01/24/2020   Pneumococcal Conjugate-13 05/25/2014   Pneumococcal Polysaccharide-23 07/21/2012, 09/30/2018   Tdap 06/20/2013   Zoster Recombinat (Shingrix) 07/30/2022, 09/23/2022   Zoster, Live 06/20/2013   Screening Tests Health Maintenance  Topic Date Due   COVID-19 Vaccine (3 - Pfizer risk series) 03/09/2023 (Originally 02/21/2020)   Diabetic kidney evaluation - Urine ACR  05/11/2023   FOOT EXAM  05/11/2023   HEMOGLOBIN A1C  05/13/2023   DTaP/Tdap/Td (2 - Td or Tdap) 06/21/2023   Diabetic kidney evaluation - eGFR measurement  12/24/2023   OPHTHALMOLOGY EXAM  01/21/2024   Medicare Annual Wellness (AWV)  01/25/2024   Pneumonia Vaccine 68+ Years old  Completed   INFLUENZA VACCINE  Completed   Hepatitis C Screening  Completed   Zoster Vaccines- Shingrix  Completed   HPV VACCINES  Aged Out   COLONOSCOPY (Pts 45-52yr Insurance coverage will need to be confirmed)  Discontinued   Health Maintenance There are no preventive care reminders to display for this patient.  Lung Cancer  Screening: (Low Dose CT Chest recommended if Age 79-80years, 30 pack-year currently smoking OR have quit w/in 15years.) does not qualify.   Hepatitis C Screening: Completed 2018.  Vision Screening: Recommended annual ophthalmology exams for early detection of glaucoma and other disorders of the eye.  Dental Screening: Recommended annual dental exams for proper oral hygiene  Community Resource Referral / Chronic Care Management: CRR required this visit?  No   CCM required this visit?  No      Plan:     I have personally reviewed and noted the following in the patient's chart:   Medical and social history Use of alcohol, tobacco or illicit drugs  Current medications and supplements including opioid prescriptions. Patient is not currently taking opioid prescriptions. Functional ability and status Nutritional status Physical activity Advanced directives List of other physicians Hospitalizations, surgeries, and ER visits in previous 12 months Vitals Screenings to include cognitive, depression, and falls Referrals and appointments  In addition, I have reviewed and discussed with patient certain preventive protocols, quality metrics, and best practice recommendations. A written personalized care plan for preventive services as well as general preventive health recommendations were provided to patient.     DRiver [place] LPN   [id number]    I have reviewed the above information and agree with above.   TDeborra Medina MD

## 2023-01-24 NOTE — Patient Instructions (Addendum)
Richard Hardy , Thank you for taking time to come for your Medicare Wellness Visit. I appreciate your ongoing commitment to your health goals. Please review the following plan we discussed and let me know if I can assist you in the future.   These are the goals we discussed:  Goals       Patient Stated     Weight (lb) < 200 lb (90.7 kg) (pt-stated)      Lose about 10 lbs in weight Lower A1C          This is a list of the screening recommended for you and due dates:  Health Maintenance  Topic Date Due   COVID-19 Vaccine (3 - Pfizer risk series) 03/09/2023*   Yearly kidney health urinalysis for diabetes  05/11/2023   Complete foot exam   05/11/2023   Hemoglobin A1C  05/13/2023   DTaP/Tdap/Td vaccine (2 - Td or Tdap) 06/21/2023   Yearly kidney function blood test for diabetes  12/24/2023   Eye exam for diabetics  01/21/2024   Medicare Annual Wellness Visit  01/25/2024   Pneumonia Vaccine  Completed   Flu Shot  Completed   Hepatitis C Screening: USPSTF Recommendation to screen - Ages 18-79 yo.  Completed   Zoster (Shingles) Vaccine  Completed   HPV Vaccine  Aged Out   Colon Cancer Screening  Discontinued  *Topic was postponed. The date shown is not the original due date.    Advanced directives: on file.  Conditions/risks identified: none new.  Next appointment: Follow up in one year for your annual wellness visit.   Preventive Care 31 Years and Older, Male  Preventive care refers to lifestyle choices and visits with your health care provider that can promote health and wellness. What does preventive care include? A yearly physical exam. This is also called an annual well check. Dental exams once or twice a year. Routine eye exams. Ask your health care provider how often you should have your eyes checked. Personal lifestyle choices, including: Daily care of your teeth and gums. Regular physical activity. Eating a healthy diet. Avoiding tobacco and drug use. Limiting  alcohol use. Practicing safe sex. Taking low doses of aspirin every day. Taking vitamin and mineral supplements as recommended by your health care provider. What happens during an annual well check? The services and screenings done by your health care provider during your annual well check will depend on your age, overall health, lifestyle risk factors, and family history of disease. Counseling  Your health care provider may ask you questions about your: Alcohol use. Tobacco use. Drug use. Emotional well-being. Home and relationship well-being. Sexual activity. Eating habits. History of falls. Memory and ability to understand (cognition). Work and work Statistician. Screening  You may have the following tests or measurements: Height, weight, and BMI. Blood pressure. Lipid and cholesterol levels. These may be checked every 5 years, or more frequently if you are over 51 years old. Skin check. Lung cancer screening. You may have this screening every year starting at age 23 if you have a 30-pack-year history of smoking and currently smoke or have quit within the past 15 years. Fecal occult blood test (FOBT) of the stool. You may have this test every year starting at age 4. Flexible sigmoidoscopy or colonoscopy. You may have a sigmoidoscopy every 5 years or a colonoscopy every 10 years starting at age 51. Prostate cancer screening. Recommendations will vary depending on your family history and other risks. Hepatitis C blood test. Hepatitis  B blood test. Sexually transmitted disease (STD) testing. Diabetes screening. This is done by checking your blood sugar (glucose) after you have not eaten for a while (fasting). You may have this done every 1-3 years. Abdominal aortic aneurysm (AAA) screening. You may need this if you are a current or former smoker. Osteoporosis. You may be screened starting at age 39 if you are at high risk. Talk with your health care provider about your test results,  treatment options, and if necessary, the need for more tests. Vaccines  Your health care provider may recommend certain vaccines, such as: Influenza vaccine. This is recommended every year. Tetanus, diphtheria, and acellular pertussis (Tdap, Td) vaccine. You may need a Td booster every 10 years. Zoster vaccine. You may need this after age 33. Pneumococcal 13-valent conjugate (PCV13) vaccine. One dose is recommended after age 56. Pneumococcal polysaccharide (PPSV23) vaccine. One dose is recommended after age 34. Talk to your health care provider about which screenings and vaccines you need and how often you need them. This information is not intended to replace advice given to you by your health care provider. Make sure you discuss any questions you have with your health care provider. Document Released: 12/22/2015 Document Revised: 08/14/2016 Document Reviewed: 09/26/2015 Elsevier Interactive Patient Education  2017 [place] Prevention in the Home Falls can cause injuries. They can happen to people of all ages. There are many things you can do to make your home safe and to help prevent falls. What can I do on the outside of my home? Regularly fix the edges of walkways and driveways and fix any cracks. Remove anything that might make you trip as you walk through a door, such as a raised step or threshold. Trim any bushes or trees on the path to your home. Use bright outdoor lighting. Clear any walking paths of anything that might make someone trip, such as rocks or tools. Regularly check to see if handrails are loose or broken. Make sure that both sides of any steps have handrails. Any raised decks and porches should have guardrails on the edges. Have any leaves, snow, or ice cleared regularly. Use sand or salt on walking paths during winter. Clean up any spills in your garage right away. This includes oil or grease spills. What can I do in the bathroom? Use night  lights. Install grab bars by the toilet and in the tub and shower. Do not use towel bars as grab bars. Use non-skid mats or decals in the tub or shower. If you need to sit down in the shower, use a plastic, non-slip stool. Keep the floor dry. Clean up any water that spills on the floor as soon as it happens. Remove soap buildup in the tub or shower regularly. Attach bath mats securely with double-sided non-slip rug tape. Do not have throw rugs and other things on the floor that can make you trip. What can I do in the bedroom? Use night lights. Make sure that you have a light by your bed that is easy to reach. Do not use any sheets or blankets that are too big for your bed. They should not hang down onto the floor. Have a firm chair that has side arms. You can use this for support while you get dressed. Do not have throw rugs and other things on the floor that can make you trip. What can I do in the kitchen? Clean up any spills right away. Avoid walking on wet floors. Keep  items that you use a lot in easy-to-reach places. If you need to reach something above you, use a strong step stool that has a grab bar. Keep electrical cords out of the way. Do not use floor polish or wax that makes floors slippery. If you must use wax, use non-skid floor wax. Do not have throw rugs and other things on the floor that can make you trip. What can I do with my stairs? Do not leave any items on the stairs. Make sure that there are handrails on both sides of the stairs and use them. Fix handrails that are broken or loose. Make sure that handrails are as long as the stairways. Check any carpeting to make sure that it is firmly attached to the stairs. Fix any carpet that is loose or worn. Avoid having throw rugs at the top or bottom of the stairs. If you do have throw rugs, attach them to the floor with carpet tape. Make sure that you have a light switch at the top of the stairs and the bottom of the stairs. If  you do not have them, ask someone to add them for you. What else can I do to help prevent falls? Wear shoes that: Do not have high heels. Have rubber bottoms. Are comfortable and fit you well. Are closed at the toe. Do not wear sandals. If you use a stepladder: Make sure that it is fully opened. Do not climb a closed stepladder. Make sure that both sides of the stepladder are locked into place. Ask someone to hold it for you, if possible. Clearly mark and make sure that you can see: Any grab bars or handrails. First and last steps. Where the edge of each step is. Use tools that help you move around (mobility aids) if they are needed. These include: Canes. Walkers. Scooters. Crutches. Turn on the lights when you go into a dark area. Replace any light bulbs as soon as they burn out. Set up your furniture so you have a clear path. Avoid moving your furniture around. If any of your floors are uneven, fix them. If there are any pets around you, be aware of where they are. Review your medicines with your doctor. Some medicines can make you feel dizzy. This can increase your chance of falling. Ask your doctor what other things that you can do to help prevent falls. This information is not intended to replace advice given to you by your health care provider. Make sure you discuss any questions you have with your health care provider. Document Released: 09/21/2009 Document Revised: 05/02/2016 Document Reviewed: 12/30/2014 Elsevier Interactive Patient Education  2017 [employer].

## 2023-02-06 ENCOUNTER — Other Ambulatory Visit: Payer: Self-pay | Admitting: Internal Medicine

## 2023-02-06 ENCOUNTER — Ambulatory Visit (INDEPENDENT_AMBULATORY_CARE_PROVIDER_SITE_OTHER): Payer: Medicare HMO

## 2023-02-06 ENCOUNTER — Other Ambulatory Visit (INDEPENDENT_AMBULATORY_CARE_PROVIDER_SITE_OTHER): Payer: Medicare HMO

## 2023-02-06 DIAGNOSIS — R918 Other nonspecific abnormal finding of lung field: Secondary | ICD-10-CM | POA: Diagnosis not present

## 2023-02-06 DIAGNOSIS — R7401 Elevation of levels of liver transaminase levels: Secondary | ICD-10-CM | POA: Diagnosis not present

## 2023-02-06 DIAGNOSIS — U071 COVID-19: Secondary | ICD-10-CM | POA: Diagnosis not present

## 2023-02-06 DIAGNOSIS — J189 Pneumonia, unspecified organism: Secondary | ICD-10-CM

## 2023-02-06 LAB — COMPREHENSIVE METABOLIC PANEL
ALT: 30 U/L (ref 0–53)
AST: 25 U/L (ref 0–37)
Albumin: 3.9 g/dL (ref 3.5–5.2)
Alkaline Phosphatase: 57 U/L (ref 39–117)
BUN: 18 mg/dL (ref 6–23)
CO2: 29 mEq/L (ref 19–32)
Calcium: 9.3 mg/dL (ref 8.4–10.5)
Chloride: 104 mEq/L (ref 96–112)
Creatinine, Ser: 0.93 mg/dL (ref 0.40–1.50)
GFR: 78.36 mL/min (ref >60.00)
Glucose, Bld: 271 mg/dL — ABNORMAL HIGH (ref 70–99)
Potassium: 4 mEq/L (ref 3.5–5.1)
Sodium: 140 mEq/L (ref 135–145)
Total Bilirubin: 0.7 mg/dL (ref 0.2–1.2)
Total Protein: 6.2 g/dL (ref 6.0–8.3)

## 2023-02-06 NOTE — Addendum Note (Signed)
Addended by: Leeanne Rio on: 02/06/2023 08:39 AM   Modules accepted: Orders

## 2023-02-07 ENCOUNTER — Encounter: Payer: Self-pay | Admitting: Internal Medicine

## 2023-02-07 ENCOUNTER — Other Ambulatory Visit: Payer: Self-pay | Admitting: Internal Medicine

## 2023-02-11 DIAGNOSIS — M955 Acquired deformity of pelvis: Secondary | ICD-10-CM | POA: Diagnosis not present

## 2023-02-11 DIAGNOSIS — M9903 Segmental and somatic dysfunction of lumbar region: Secondary | ICD-10-CM | POA: Diagnosis not present

## 2023-02-11 DIAGNOSIS — M9905 Segmental and somatic dysfunction of pelvic region: Secondary | ICD-10-CM | POA: Diagnosis not present

## 2023-02-11 DIAGNOSIS — M9901 Segmental and somatic dysfunction of cervical region: Secondary | ICD-10-CM | POA: Diagnosis not present

## 2023-02-11 DIAGNOSIS — M5441 Lumbago with sciatica, right side: Secondary | ICD-10-CM | POA: Diagnosis not present

## 2023-02-11 DIAGNOSIS — M531 Cervicobrachial syndrome: Secondary | ICD-10-CM | POA: Diagnosis not present

## 2023-03-13 ENCOUNTER — Encounter: Payer: Self-pay | Admitting: Dermatology

## 2023-03-13 ENCOUNTER — Ambulatory Visit: Payer: Medicare HMO | Admitting: Dermatology

## 2023-03-13 VITALS — BP 129/67

## 2023-03-13 DIAGNOSIS — L570 Actinic keratosis: Secondary | ICD-10-CM | POA: Diagnosis not present

## 2023-03-13 DIAGNOSIS — L711 Rhinophyma: Secondary | ICD-10-CM

## 2023-03-13 DIAGNOSIS — L578 Other skin changes due to chronic exposure to nonionizing radiation: Secondary | ICD-10-CM | POA: Diagnosis not present

## 2023-03-13 DIAGNOSIS — L821 Other seborrheic keratosis: Secondary | ICD-10-CM | POA: Diagnosis not present

## 2023-03-13 DIAGNOSIS — Z1283 Encounter for screening for malignant neoplasm of skin: Secondary | ICD-10-CM

## 2023-03-13 DIAGNOSIS — L820 Inflamed seborrheic keratosis: Secondary | ICD-10-CM | POA: Diagnosis not present

## 2023-03-13 DIAGNOSIS — L719 Rosacea, unspecified: Secondary | ICD-10-CM

## 2023-03-13 DIAGNOSIS — Z7189 Other specified counseling: Secondary | ICD-10-CM

## 2023-03-13 DIAGNOSIS — D229 Melanocytic nevi, unspecified: Secondary | ICD-10-CM | POA: Diagnosis not present

## 2023-03-13 DIAGNOSIS — D692 Other nonthrombocytopenic purpura: Secondary | ICD-10-CM

## 2023-03-13 DIAGNOSIS — Z85828 Personal history of other malignant neoplasm of skin: Secondary | ICD-10-CM

## 2023-03-13 DIAGNOSIS — L814 Other melanin hyperpigmentation: Secondary | ICD-10-CM

## 2023-03-13 DIAGNOSIS — D1801 Hemangioma of skin and subcutaneous tissue: Secondary | ICD-10-CM

## 2023-03-13 NOTE — Progress Notes (Signed)
Follow-Up Visit   Subjective  Richard Hardy is a 77 y.o. male who presents for the following: Skin Cancer Screening and Full Body Skin Exam, hx of BCC, AKs The patient presents for Total-Body Skin Exam (TBSE) for skin cancer screening and mole check. The patient has spots, moles and lesions to be evaluated, some may be new or changing and the patient has concerns that these could be cancer.  The following portions of the chart were reviewed this encounter and updated as appropriate: medications, allergies, medical history  Review of Systems:  No other skin or systemic complaints except as noted in HPI or Assessment and Plan.  Objective  Well appearing patient in no apparent distress; mood and affect are within normal limits.  A full examination was performed including scalp, head, eyes, ears, nose, lips, neck, chest, axillae, abdomen, back, buttocks, bilateral upper extremities, bilateral lower extremities, hands, feet, fingers, toes, fingernails, and toenails. All findings within normal limits unless otherwise noted below.   Relevant physical exam findings are noted in the Assessment and Plan.   Assessment & Plan   LENTIGINES, SEBORRHEIC KERATOSES, HEMANGIOMAS - Benign normal skin lesions - Benign-appearing - Call for any changes  MELANOCYTIC NEVI - Tan-brown and/or pink-flesh-colored symmetric macules and papules - Benign appearing on exam today - Observation - Call clinic for new or changing moles - Recommend daily use of broad spectrum spf 30+ sunscreen to sun-exposed areas.   ACTINIC DAMAGE - Chronic condition, secondary to cumulative UV/sun exposure - diffuse scaly erythematous macules with underlying dyspigmentation - Recommend daily broad spectrum sunscreen SPF 30+ to sun-exposed areas, reapply every 2 hours as needed.  - Staying in the shade or wearing long sleeves, sun glasses (UVA+UVB protection) and wide brim hats (4-inch brim around the entire circumference of the  hat) are also recommended for sun protection.  - Call for new or changing lesions.  SKIN CANCER SCREENING PERFORMED TODAY.  HISTORY OF BASAL CELL CARCINOMA OF THE SKIN - No evidence of recurrence today - Recommend regular full body skin exams - Recommend daily broad spectrum sunscreen SPF 30+ to sun-exposed areas, reapply every 2 hours as needed.  - Call if any new or changing lesions are noted between office visits  -L prox medial calf   ACTINIC KERATOSIS Exam: Erythematous thin papules/macules with gritty scale  Actinic keratoses are precancerous spots that appear secondary to cumulative UV radiation exposure/sun exposure over time. They are chronic with expected duration over 1 year. A portion of actinic keratoses will progress to squamous cell carcinoma of the skin. It is not possible to reliably predict which spots will progress to skin cancer and so treatment is recommended to prevent development of skin cancer.  Recommend daily broad spectrum sunscreen SPF 30+ to sun-exposed areas, reapply every 2 hours as needed.  Recommend staying in the shade or wearing long sleeves, sun glasses (UVA+UVB protection) and wide brim hats (4-inch brim around the entire circumference of the hat). Call for new or changing lesions.  Treatment Plan:  Prior to procedure, discussed risks of blister formation, small wound, skin dyspigmentation, or rare scar following cryotherapy. Recommend Vaseline ointment to treated areas while healing.  Destruction Procedure Note Destruction method: cryotherapy   Informed consent: discussed and consent obtained   Lesion destroyed using liquid nitrogen: Yes   Outcome: patient tolerated procedure well with no complications   Post-procedure details: wound care instructions given   Locations: bil ears x 11 # of Lesions Treated: 11   INFLAMED  SEBORRHEIC KERATOSIS Exam: Erythematous keratotic or waxy stuck-on papule or plaque.  Symptomatic, irritating, patient would  like treated.  Benign-appearing.  Call clinic for new or changing lesions.   Prior to procedure, discussed risks of blister formation, small wound, skin dyspigmentation, or rare scar following treatment. Recommend Vaseline ointment to treated areas while healing.  Destruction Procedure Note Destruction method: cryotherapy   Informed consent: discussed and consent obtained   Lesion destroyed using liquid nitrogen: Yes   Outcome: patient tolerated procedure well with no complications   Post-procedure details: wound care instructions given   Locations: face x 2 # of Lesions Treated: 2  ROSACEA WITH RHINOPHYMA Exam Face with erythema and telangiectasias thickening of the skin of the nose Rosacea is a chronic progressive skin condition usually affecting the face of adults, causing redness and/or acne bumps. It is treatable but not curable. It sometimes affects the eyes (ocular rosacea) as well. It may respond to topical and/or systemic medication and can flare with stress, sun exposure, alcohol, exercise, topical steroids (including hydrocortisone/cortisone 10) and some foods.  Daily application of broad spectrum spf 30+ sunscreen to face is recommended to reduce flares.  Treatment Plan Benign, observe Discussed treatment options of laser topical and oral medication and surgical treatment of rhinophyma.  Patient declines at this time.Marland Kitchen.     Purpura - Chronic; persistent and recurrent.  Treatable, but not curable. - Violaceous macules and patches - Benign - Related to trauma, age, sun damage and/or use of blood thinners, chronic use of topical and/or oral steroids - Observe - Can use OTC arnica containing moisturizer such as Dermend Bruise Formula if desired - Call for worsening or other concerns   Return in about 1 year (around 03/12/2024) for TBSE, Hx of BCC, Hx of AKs.  Richard Hardy, Richard Hardy, RMA, am acting as scribe for Armida Sansavid Gunther Zawadzki, MD .  Documentation: Richard Hardy have reviewed the above  documentation for accuracy and completeness, and Richard Hardy agree with the above.  Armida Sansavid Katena Petitjean, MD

## 2023-03-13 NOTE — Patient Instructions (Addendum)
Cryotherapy Aftercare  Wash gently with soap and water everyday.   Apply Vaseline and Band-Aid daily until healed.     Due to recent changes in healthcare laws, you may see results of your pathology and/or laboratory studies on MyChart before the doctors have had a chance to review them. We understand that in some cases there may be results that are confusing or concerning to you. Please understand that not all results are received at the same time and often the doctors may need to interpret multiple results in order to provide you with the best plan of care or course of treatment. Therefore, we ask that you please give us 2 business days to thoroughly review all your results before contacting the office for clarification. Should we see a critical lab result, you will be contacted sooner.   If You Need Anything After Your Visit  If you have any questions or concerns for your doctor, please call our main line at [phone] and press option 4 to reach your doctor's medical assistant. If no one answers, please leave a voicemail as directed and we will return your call as soon as possible. Messages left after 4 pm will be answered the following business day.   You may also send us a message via MyChart. We typically respond to MyChart messages within 1-2 business days.  For prescription refills, please ask your pharmacy to contact our office. Our fax number is [phone].  If you have an urgent issue when the clinic is closed that cannot wait until the next business day, you can page your doctor at the number below.    Please note that while we do our best to be available for urgent issues outside of office hours, we are not available 24/7.   If you have an urgent issue and are unable to reach us, you may choose to seek medical care at your doctor's office, retail clinic, urgent care center, or emergency room.  If you have a medical emergency, please immediately call 911 or go to the  emergency department.  Pager Numbers  - Dr. Kowalski: [phone]  - Dr. Moye: [phone]  - Dr. Stewart: [phone]  In the event of inclement weather, please call our main line at [phone] for an update on the status of any delays or closures.  Dermatology Medication Tips: Please keep the boxes that topical medications come in in order to help keep track of the instructions about where and how to use these. Pharmacies typically print the medication instructions only on the boxes and not directly on the medication tubes.   If your medication is too expensive, please contact our office at [phone] option 4 or send us a message through MyChart.   We are unable to tell what your co-pay for medications will be in advance as this is different depending on your insurance coverage. However, we may be able to find a substitute medication at lower cost or fill out paperwork to get insurance to cover a needed medication.   If a prior authorization is required to get your medication covered by your insurance company, please allow us 1-2 business days to complete this process.  Drug prices often vary depending on where the prescription is filled and some pharmacies may offer cheaper prices.  The website www.goodrx.com contains coupons for medications through different pharmacies. The prices here do not account for what the cost may be with help from insurance (it may be cheaper with your insurance), but the website can   give you the price if you did not use any insurance.  - You can print the associated coupon and take it with your prescription to the pharmacy.  - You may also stop by our office during regular business hours and pick up a GoodRx coupon card.  - If you need your prescription sent electronically to a different pharmacy, notify our office through [facility] MyChart or by phone at [phone] option 4.     Si Usted Necesita Algo Despus de Su Visita  Tambin puede  enviarnos un mensaje a travs de MyChart. Por lo general respondemos a los mensajes de MyChart en el transcurso de 1 a 2 das hbiles.  Para renovar recetas, por favor pida a su farmacia que se ponga en contacto con nuestra oficina. Nuestro nmero de fax es el [phone].  Si tiene un asunto urgente cuando la clnica est cerrada y que no puede esperar hasta el siguiente da hbil, puede llamar/localizar a su doctor(a) al nmero que aparece a continuacin.   Por favor, tenga en cuenta que aunque hacemos todo lo posible para estar disponibles para asuntos urgentes fuera del horario de oficina, no estamos disponibles las 24 horas del da, los 7 das de la semana.   Si tiene un problema urgente y no puede comunicarse con nosotros, puede optar por buscar atencin mdica  en el consultorio de su doctor(a), en una clnica privada, en un centro de atencin urgente o en una sala de emergencias.  Si tiene una emergencia mdica, por favor llame inmediatamente al 911 o vaya a la sala de emergencias.  Nmeros de bper  - Dr. Kowalski: [phone]  - Dra. Moye: [phone]  - Dra. Stewart: [phone]  En caso de inclemencias del tiempo, por favor llame a nuestra lnea principal al [phone] para una actualizacin sobre el estado de cualquier retraso o cierre.  Consejos para la medicacin en dermatologa: Por favor, guarde las cajas en las que vienen los medicamentos de uso tpico para ayudarle a seguir las instrucciones sobre dnde y cmo usarlos. Las farmacias generalmente imprimen las instrucciones del medicamento slo en las cajas y no directamente en los tubos del medicamento.   Si su medicamento es muy caro, por favor, pngase en contacto con nuestra oficina llamando al [phone] y presione la opcin 4 o envenos un mensaje a travs de MyChart.   No podemos decirle cul ser su copago por los medicamentos por adelantado ya que esto es diferente dependiendo de la cobertura de su seguro.  Sin embargo, es posible que podamos encontrar un medicamento sustituto a menor costo o llenar un formulario para que el seguro cubra el medicamento que se considera necesario.   Si se requiere una autorizacin previa para que su compaa de seguros cubra su medicamento, por favor permtanos de 1 a 2 das hbiles para completar este proceso.  Los precios de los medicamentos varan con frecuencia dependiendo del lugar de dnde se surte la receta y alguna farmacias pueden ofrecer precios ms baratos.  El sitio web www.goodrx.com tiene cupones para medicamentos de diferentes farmacias. Los precios aqu no tienen en cuenta lo que podra costar con la ayuda del seguro (puede ser ms barato con su seguro), pero el sitio web puede darle el precio si no utiliz ningn seguro.  - Puede imprimir el cupn correspondiente y llevarlo con su receta a la farmacia.  - Tambin puede pasar por nuestra oficina durante el horario de atencin regular y recoger una tarjeta de cupones de GoodRx.  -   Si necesita que su receta se enve electrnicamente a una farmacia diferente, informe a nuestra oficina a travs de MyChart de [facility] o por telfono llamando al [phone] y presione la opcin 4.  

## 2023-03-15 ENCOUNTER — Encounter: Payer: Self-pay | Admitting: Dermatology

## 2023-03-18 DIAGNOSIS — M9905 Segmental and somatic dysfunction of pelvic region: Secondary | ICD-10-CM | POA: Diagnosis not present

## 2023-03-18 DIAGNOSIS — M955 Acquired deformity of pelvis: Secondary | ICD-10-CM | POA: Diagnosis not present

## 2023-03-18 DIAGNOSIS — M5441 Lumbago with sciatica, right side: Secondary | ICD-10-CM | POA: Diagnosis not present

## 2023-03-18 DIAGNOSIS — M531 Cervicobrachial syndrome: Secondary | ICD-10-CM | POA: Diagnosis not present

## 2023-03-18 DIAGNOSIS — M9903 Segmental and somatic dysfunction of lumbar region: Secondary | ICD-10-CM | POA: Diagnosis not present

## 2023-03-18 DIAGNOSIS — M9901 Segmental and somatic dysfunction of cervical region: Secondary | ICD-10-CM | POA: Diagnosis not present

## 2023-04-29 DIAGNOSIS — M955 Acquired deformity of pelvis: Secondary | ICD-10-CM | POA: Diagnosis not present

## 2023-04-29 DIAGNOSIS — M5441 Lumbago with sciatica, right side: Secondary | ICD-10-CM | POA: Diagnosis not present

## 2023-04-29 DIAGNOSIS — M9905 Segmental and somatic dysfunction of pelvic region: Secondary | ICD-10-CM | POA: Diagnosis not present

## 2023-04-29 DIAGNOSIS — M9901 Segmental and somatic dysfunction of cervical region: Secondary | ICD-10-CM | POA: Diagnosis not present

## 2023-04-29 DIAGNOSIS — M531 Cervicobrachial syndrome: Secondary | ICD-10-CM | POA: Diagnosis not present

## 2023-04-29 DIAGNOSIS — M9903 Segmental and somatic dysfunction of lumbar region: Secondary | ICD-10-CM | POA: Diagnosis not present

## 2023-05-06 ENCOUNTER — Other Ambulatory Visit: Payer: Self-pay | Admitting: Internal Medicine

## 2023-05-14 ENCOUNTER — Encounter: Payer: Self-pay | Admitting: Internal Medicine

## 2023-05-14 ENCOUNTER — Ambulatory Visit (INDEPENDENT_AMBULATORY_CARE_PROVIDER_SITE_OTHER): Payer: Medicare HMO | Admitting: Internal Medicine

## 2023-05-14 VITALS — BP 126/60 | HR 45 | Temp 97.5°F | Resp 17 | Ht 70.0 in | Wt 212.0 lb

## 2023-05-14 DIAGNOSIS — T50905A Adverse effect of unspecified drugs, medicaments and biological substances, initial encounter: Secondary | ICD-10-CM

## 2023-05-14 DIAGNOSIS — E875 Hyperkalemia: Secondary | ICD-10-CM

## 2023-05-14 DIAGNOSIS — Z789 Other specified health status: Secondary | ICD-10-CM | POA: Insufficient documentation

## 2023-05-14 DIAGNOSIS — I10 Essential (primary) hypertension: Secondary | ICD-10-CM

## 2023-05-14 DIAGNOSIS — I872 Venous insufficiency (chronic) (peripheral): Secondary | ICD-10-CM | POA: Diagnosis not present

## 2023-05-14 DIAGNOSIS — Z7984 Long term (current) use of oral hypoglycemic drugs: Secondary | ICD-10-CM | POA: Diagnosis not present

## 2023-05-14 DIAGNOSIS — E119 Type 2 diabetes mellitus without complications: Secondary | ICD-10-CM

## 2023-05-14 DIAGNOSIS — E785 Hyperlipidemia, unspecified: Secondary | ICD-10-CM

## 2023-05-14 DIAGNOSIS — E1169 Type 2 diabetes mellitus with other specified complication: Secondary | ICD-10-CM | POA: Diagnosis not present

## 2023-05-14 LAB — LIPID PANEL
Cholesterol: 91 mg/dL (ref 0–200)
HDL: 43.8 mg/dL (ref >39.00)
LDL Cholesterol: 37 mg/dL (ref 0–99)
NonHDL: 47.51
Total CHOL/HDL Ratio: 2
Triglycerides: 51 mg/dL (ref 0.0–149.0)
VLDL: 10.2 mg/dL (ref 0.0–40.0)

## 2023-05-14 LAB — COMPREHENSIVE METABOLIC PANEL
ALT: 28 U/L (ref 0–53)
AST: 23 U/L (ref 0–37)
Albumin: 4.1 g/dL (ref 3.5–5.2)
Alkaline Phosphatase: 53 U/L (ref 39–117)
BUN: 22 mg/dL (ref 6–23)
CO2: 24 mEq/L (ref 19–32)
Calcium: 9 mg/dL (ref 8.4–10.5)
Chloride: 105 mEq/L (ref 96–112)
Creatinine, Ser: 0.94 mg/dL (ref 0.40–1.50)
GFR: 77.21 mL/min (ref >60.00)
Glucose, Bld: 143 mg/dL — ABNORMAL HIGH (ref 70–99)
Potassium: 4.1 mEq/L (ref 3.5–5.1)
Sodium: 140 mEq/L (ref 135–145)
Total Bilirubin: 0.9 mg/dL (ref 0.2–1.2)
Total Protein: 6.5 g/dL (ref 6.0–8.3)

## 2023-05-14 LAB — HEMOGLOBIN A1C: Hgb A1c MFr Bld: 7.3 % — ABNORMAL HIGH (ref 4.6–6.5)

## 2023-05-14 LAB — LDL CHOLESTEROL, DIRECT: Direct LDL: 46 mg/dL

## 2023-05-14 MED ORDER — FUROSEMIDE 20 MG PO TABS: 20.0000 mg | ORAL_TABLET | Freq: Every day | ORAL | 0 refills | Status: DC

## 2023-05-14 MED ORDER — TRIAMCINOLONE ACETONIDE 0.1 % EX CREA: 1.0000 | TOPICAL_CREAM | Freq: Two times a day (BID) | CUTANEOUS | 1 refills | Status: DC

## 2023-05-14 MED ORDER — METFORMIN HCL ER 500 MG PO TB24: ORAL_TABLET | ORAL | 1 refills | Status: DC

## 2023-05-14 NOTE — Assessment & Plan Note (Signed)
Secondary to cough

## 2023-05-14 NOTE — Progress Notes (Signed)
.  h 

## 2023-05-14 NOTE — Assessment & Plan Note (Signed)
Secondary to ARB trial

## 2023-05-14 NOTE — Assessment & Plan Note (Signed)
Well controlled on current regimen pf amlodipine 5 mg daily .He is ACE/ARB intolerant. , no changes today.   Adding furosemide for use 1-2 times per week to manage edema which is aggravating his venous stasis dermatitis

## 2023-05-14 NOTE — Assessment & Plan Note (Signed)
Historically well-controlled on cmetformin and glipizide at sub maximal doses.  Patient is  up to date on annual eye exam and foot exam is normal today. Patient has no microalbuminuria and is intolerant of ACE/ARB.   Patient is tolerating statin therapy for CAD risk reduction .

## 2023-05-14 NOTE — Patient Instructions (Addendum)
I recommend using Cetaphil very day  and wearing compression socks when you are going to be up on your feet all day  Put them on FIRST THING IN THE MORNING (WHILE YOUR LEGS ARE SKINNY  You can use triamcinolone  if needed  for itching  You can use the fluid pill (furosemide)  once or twice a week for swelling   If you are going to be workig outside and skipping lunch,  I would suspend the glipizide for that morning

## 2023-05-14 NOTE — Progress Notes (Signed)
Subjective:  Patient ID: Richard Hardy, male    DOB: 02/06/1946  Age: 77 y.o. MRN: [id number]  CC: The primary encounter diagnosis was Hyperlipidemia due to type 2 diabetes mellitus (HCC). Diagnoses of Venous stasis dermatitis of both lower extremities, Venous insufficiency of both lower extremities, Controlled type 2 diabetes mellitus without complication, without long-term current use of insulin (HCC), ACE inhibitor intolerance, Drug-induced hyperkalemia, and Primary hypertension were also pertinent to this visit.   HPI JASE VOLKOV presents for  Chief Complaint  Patient presents with   Medical Management of Chronic Issues    6 mth diabetic f/u    He feels generally well, notes a gradual improvement in his energy level  is walking daily  and doing lawnwork  (push lawnmower).  Using G2 and water for hydration. checking blood sugars once daily at variable times.  BS have been under 130 fasting and < 150 post prandially.  One recent hypoglyemic event  (BS 76) after  working in the yard.   Taking  glipizide and metformin  as directed. Following a carbohydrate modified diet 6 days per week. Denies numbness, burning and tingling of extremities. Appetite is good.   Hypertension: patient checks blood pressure twice weekly at home.  Readings have been for the most part < 130/80 at rest . Patient is following a reduce salt diet most days and is taking medications as prescribed      Venous stasis dermatitis :  not wearing stockings.  Gets rash is aggravated by mowing the grass (push mowing_  Fire ant bite on right medial ankle   Outpatient Medications Prior to Visit  Medication Sig Dispense Refill   amLODipine (NORVASC) 5 MG tablet Take 1 tablet (5 mg total) by mouth daily. 90 tablet 3   atorvastatin (LIPITOR) 20 MG tablet Take 1 tablet (20 mg total) by mouth daily. 90 tablet 3   Blood Glucose Monitoring Suppl (ONETOUCH VERIO IQ SYSTEM) w/Device KIT Use to check blood sugars once daily. 1 kit 0    chlorhexidine (PERIDEX) 0.12 % solution SMARTSIG:0.5 Ounce(s) By Mouth Twice Daily     CINNAMON PO Take 2 capsules by mouth daily.     Coenzyme Q10 (COQ10) 200 MG CAPS Take 1 tablet by mouth daily.     Fish Oil OIL Take by mouth daily.     fluticasone (FLONASE) 50 MCG/ACT nasal spray Place into the nose at bedtime.     glipiZIDE (GLUCOTROL) 5 MG tablet TAKE 1 TABLET (5 MG TOTAL) BY MOUTH TWICE A DAY BEFORE MEALS 180 tablet 1   glucosamine-chondroitin 500-400 MG tablet Take 1 tablet by mouth in the morning and at bedtime.     MAGNESIUM CITRATE PO Take 1 capsule by mouth in the morning and at bedtime.     Multiple Vitamin (MULTIVITAMIN) capsule Take 1 capsule by mouth daily.     mupirocin ointment (BACTROBAN) 2 % Apply 1 application topically 2 (two) times daily. 30 g 2   ONETOUCH DELICA LANCETS 33G MISC Use to check blood sugars once daily. 100 each 1   ONETOUCH VERIO test strip USE TO CHECK SUGARS ONCE OR TWICE DAILY . 100 strip 1   Turmeric 500 MG TABS Take 1 tablet by mouth daily.     vitamin C (ASCORBIC ACID) 500 MG tablet Take 500 mg by mouth daily.     naproxen (NAPROSYN) 250 MG tablet PLEASE SEE ATTACHED FOR DETAILED DIRECTIONS     metFORMIN (GLUCOPHAGE-XR) 500 MG 24 hr tablet TAKE  1 TABLET BY MOUTH EVERY DAY WITH BREAKFAST 90 tablet 1   No facility-administered medications prior to visit.    Review of Systems;  Patient denies headache, fevers, malaise, unintentional weight loss, skin rash, eye pain, sinus congestion and sinus pain, sore throat, dysphagia,  hemoptysis , cough, dyspnea, wheezing, chest pain, palpitations, orthopnea, edema, abdominal pain, nausea, melena, diarrhea, constipation, flank pain, dysuria, hematuria, urinary  Frequency, nocturia, numbness, tingling, seizures,  Focal weakness, Loss of consciousness,  Tremor, insomnia, depression, anxiety, and suicidal ideation.      Objective:  BP 126/60   Pulse (!) 45   Temp (!) 97.5 F (36.4 C) (Oral)   Resp 17   Ht  5\' 10"  (1.778 m)   Wt 212 lb (96.2 kg)   SpO2 97%   BMI 30.42 kg/m   BP Readings from Last 3 Encounters:  05/14/23 126/60  03/13/23 129/67  12/23/22 116/68    Wt Readings from Last 3 Encounters:  05/14/23 212 lb (96.2 kg)  01/24/23 208 lb (94.3 kg)  12/23/22 208 lb 3.2 oz (94.4 kg)    Physical Exam Vitals reviewed.  Constitutional:      General: He is not in acute distress.    Appearance: Normal appearance. He is normal weight. He is not ill-appearing, toxic-appearing or diaphoretic.  HENT:     Head: Normocephalic.  Eyes:     General: No scleral icterus.       Right eye: No discharge.        Left eye: No discharge.     Conjunctiva/sclera: Conjunctivae normal.  Cardiovascular:     Rate and Rhythm: Normal rate and regular rhythm.     Heart sounds: Normal heart sounds.  Pulmonary:     Effort: Pulmonary effort is normal. No respiratory distress.     Breath sounds: Normal breath sounds.  Musculoskeletal:        General: Normal range of motion.     Cervical back: Normal range of motion.  Skin:    General: Skin is warm and dry.     Findings: Rash present. Rash is macular.          Comments: Venous stasis dermatitis   Neurological:     General: No focal deficit present.     Mental Status: He is alert and oriented to person, place, and time. Mental status is at baseline.  Psychiatric:        Mood and Affect: Mood normal.        Behavior: Behavior normal.        Thought Content: Thought content normal.        Judgment: Judgment normal.    Lab Results  Component Value Date   HGBA1C 6.9 (H) 11/11/2022   HGBA1C 7.0 (H) 05/10/2022   HGBA1C 6.6 (H) 11/09/2021    Lab Results  Component Value Date   CREATININE 0.93 02/06/2023   CREATININE 1.00 12/23/2022   CREATININE 0.75 12/15/2022    Lab Results  Component Value Date   WBC 5.2 12/15/2022   HGB 13.6 12/15/2022   HCT 37.2 (L) 12/15/2022   PLT 83 (L) 12/15/2022   GLUCOSE 271 (H) 02/06/2023   CHOL 106  11/11/2022   TRIG 76.0 11/11/2022   HDL 45.50 11/11/2022   LDLDIRECT 55.0 05/10/2022   LDLCALC 45 11/11/2022   ALT 30 02/06/2023   AST 25 02/06/2023   NA 140 02/06/2023   K 4.0 02/06/2023   CL 104 02/06/2023   CREATININE 0.93 02/06/2023   BUN 18  02/06/2023   CO2 29 02/06/2023   TSH 2.46 11/11/2022   PSA 0.32 11/10/2019   HGBA1C 6.9 (H) 11/11/2022   MICROALBUR 1.1 05/10/2022    No results found.  Assessment & Plan:  .Hyperlipidemia due to type 2 diabetes mellitus [facility]) Assessment & Plan: Tolerating atorvastatin  Orders: -     Microalbumin / creatinine urine ratio -     Hemoglobin A1c -     Comprehensive metabolic panel -     Lipid panel -     LDL cholesterol, direct  Venous stasis dermatitis of both lower extremities  Venous insufficiency of both lower extremities Assessment & Plan: Aggravated by  push mowing the grass.  Triamcinolone prescribed  encouraged to start using his compression socks.    Controlled type 2 diabetes mellitus without complication, without long-term current use of insulin (HCC) Assessment & Plan: Historically well-controlled on cmetformin and glipizide at sub maximal doses.  Patient is  up to date on annual eye exam and foot exam is normal today. Patient has no microalbuminuria and is intolerant of ACE/ARB.   Patient is tolerating statin therapy for CAD risk reduction .     ACE inhibitor intolerance Assessment & Plan: Secondary to cough   Drug-induced hyperkalemia Assessment & Plan: Secondary to ARB trial    Primary hypertension Assessment & Plan: Well controlled on current regimen pf amlodipine 5 mg daily .He is ACE/ARB intolerant. , no changes today.   Adding furosemide for use 1-2 times per week to manage edema which is aggravating his venous stasis dermatitis    Other orders -     metFORMIN HCl ER; TAKE 1 TABLET BY MOUTH EVERY DAY WITH BREAKFAST  Dispense: 90 tablet; Refill: 1 -     Furosemide; Take 1 tablet (20 mg total) by  mouth daily. As needed for fluid retention  Dispense: 30 tablet; Refill: 0 -     Triamcinolone Acetonide; Apply 1 Application topically 2 (two) times daily. As needed for itching  Dispense: 80 g; Refill: 1     I provided 30 minutes of face-to-face time during this encounter reviewing patient's last visit with me, patient's  most recent visit with cardiology,  nephrology,  and neurology,  recent surgical and non surgical procedures, previous  labs and imaging studies, counseling on currently addressed issues,  and post visit ordering to diagnostics and therapeutics .   Follow-up: No follow-ups on file.   Sherlene Shams, MD

## 2023-05-14 NOTE — Assessment & Plan Note (Signed)
Aggravated by  push mowing the grass.  Triamcinolone prescribed  encouraged to start using his compression socks.

## 2023-05-14 NOTE — Assessment & Plan Note (Signed)
Tolerating atorvastatin  ?

## 2023-05-15 LAB — MICROALBUMIN / CREATININE URINE RATIO
Creatinine,U: 132.8 mg/dL
Microalb Creat Ratio: 1.1 mg/g (ref 0.0–30.0)
Microalb, Ur: 1.5 mg/dL (ref 0.0–1.9)

## 2023-06-02 ENCOUNTER — Telehealth: Payer: Self-pay | Admitting: Internal Medicine

## 2023-06-02 DIAGNOSIS — E119 Type 2 diabetes mellitus without complications: Secondary | ICD-10-CM

## 2023-06-02 NOTE — Telephone Encounter (Signed)
Blood sugar readings dropped off .Given to Tull[facility]'s CMA. Label created.

## 2023-06-02 NOTE — Telephone Encounter (Signed)
Placed in yellow results folder.  °

## 2023-06-03 DIAGNOSIS — M9901 Segmental and somatic dysfunction of cervical region: Secondary | ICD-10-CM | POA: Diagnosis not present

## 2023-06-03 DIAGNOSIS — M5441 Lumbago with sciatica, right side: Secondary | ICD-10-CM | POA: Diagnosis not present

## 2023-06-03 DIAGNOSIS — M955 Acquired deformity of pelvis: Secondary | ICD-10-CM | POA: Diagnosis not present

## 2023-06-03 DIAGNOSIS — M9903 Segmental and somatic dysfunction of lumbar region: Secondary | ICD-10-CM | POA: Diagnosis not present

## 2023-06-03 DIAGNOSIS — M531 Cervicobrachial syndrome: Secondary | ICD-10-CM | POA: Diagnosis not present

## 2023-06-03 DIAGNOSIS — M9905 Segmental and somatic dysfunction of pelvic region: Secondary | ICD-10-CM | POA: Diagnosis not present

## 2023-06-04 ENCOUNTER — Encounter: Payer: Self-pay | Admitting: Internal Medicine

## 2023-06-04 NOTE — Assessment & Plan Note (Signed)
June review: His blood sugars are only high after breakfast based on readings submitted.  Try changing to more protein based breakfasts,  (less cereal, bread) or go for a walk after breakfast to help lower sugar

## 2023-06-06 ENCOUNTER — Other Ambulatory Visit: Payer: Self-pay | Admitting: Internal Medicine

## 2023-07-08 ENCOUNTER — Other Ambulatory Visit: Payer: Self-pay | Admitting: Cardiovascular Disease

## 2023-07-08 DIAGNOSIS — M955 Acquired deformity of pelvis: Secondary | ICD-10-CM | POA: Diagnosis not present

## 2023-07-08 DIAGNOSIS — M9901 Segmental and somatic dysfunction of cervical region: Secondary | ICD-10-CM | POA: Diagnosis not present

## 2023-07-08 DIAGNOSIS — M5441 Lumbago with sciatica, right side: Secondary | ICD-10-CM | POA: Diagnosis not present

## 2023-07-08 DIAGNOSIS — M9903 Segmental and somatic dysfunction of lumbar region: Secondary | ICD-10-CM | POA: Diagnosis not present

## 2023-07-08 DIAGNOSIS — M531 Cervicobrachial syndrome: Secondary | ICD-10-CM | POA: Diagnosis not present

## 2023-07-08 DIAGNOSIS — M9905 Segmental and somatic dysfunction of pelvic region: Secondary | ICD-10-CM | POA: Diagnosis not present

## 2023-07-08 NOTE — Telephone Encounter (Signed)
Please contact pt for future appointment. Pt due for 12 month f/u. 

## 2023-07-09 ENCOUNTER — Telehealth: Payer: Self-pay | Admitting: Cardiovascular Disease

## 2023-07-09 NOTE — Telephone Encounter (Signed)
Left voice mail to schedule appt

## 2023-07-09 NOTE — Telephone Encounter (Signed)
Pt needs to schedule appt from recall.

## 2023-07-10 NOTE — Telephone Encounter (Signed)
Pt is scheduled on 9/17

## 2023-08-04 ENCOUNTER — Other Ambulatory Visit: Payer: Self-pay | Admitting: Internal Medicine

## 2023-08-12 ENCOUNTER — Encounter: Payer: Self-pay | Admitting: Nurse Practitioner

## 2023-08-12 ENCOUNTER — Telehealth: Payer: Self-pay

## 2023-08-12 ENCOUNTER — Ambulatory Visit (INDEPENDENT_AMBULATORY_CARE_PROVIDER_SITE_OTHER): Payer: Medicare HMO | Admitting: Nurse Practitioner

## 2023-08-12 VITALS — BP 120/80 | HR 66 | Temp 98.0°F | Resp 16 | Ht 70.0 in | Wt 215.0 lb

## 2023-08-12 DIAGNOSIS — S91331A Puncture wound without foreign body, right foot, initial encounter: Secondary | ICD-10-CM

## 2023-08-12 DIAGNOSIS — Z23 Encounter for immunization: Secondary | ICD-10-CM

## 2023-08-12 MED ORDER — LEVOFLOXACIN 500 MG PO TABS: 500.0000 mg | ORAL_TABLET | Freq: Every day | ORAL | 0 refills | Status: AC

## 2023-08-12 NOTE — Telephone Encounter (Signed)
Patient's wife, Mathayus Heinig, called to state patient stepped on a rusty nail (bottom of his right foot) on 08/10/2023.  Harriett Sine states patient is diabetic.  Patient states his ankles are swollen from diabetes.  Soaking with epsom salts and standing in peroxide.  Put antibiotic cream on it.  Patient states he believes he is close to his TDAP expiring.   I scheduled an appointment for patient at [facility] today at 2pm.

## 2023-08-12 NOTE — Progress Notes (Signed)
Established Patient Visit  Patient: Richard Hardy   DOB: 11-27-1946   77 y.o. Male  MRN: [id number] Visit Date: 08/12/2023  Subjective:    Chief Complaint  Patient presents with   Wound Check    Stepped on nail Sunday afternoon-  Right foot.   Needs TD    Wound Check He was originally treated 2 to 3 days ago. Previous treatment included wound cleansing or irrigation. His temperature was unmeasured prior to arrival. There has been no drainage from the wound. There is no redness present. The swelling has worsened. The pain has not changed. He has no difficulty moving the affected extremity or digit.  Hx of type 2 DIABETES with hgbA1c at 7.9%  Reviewed medical, surgical, and social history today  Medications: Outpatient Medications Prior to Visit  Medication Sig   amLODipine (NORVASC) 5 MG tablet TAKE 1 TABLET (5 MG TOTAL) BY MOUTH DAILY.   atorvastatin (LIPITOR) 20 MG tablet Take 1 tablet (20 mg total) by mouth daily.   Blood Glucose Monitoring Suppl (ONETOUCH VERIO IQ SYSTEM) w/Device KIT Use to check blood sugars once daily.   chlorhexidine (PERIDEX) 0.12 % solution SMARTSIG:0.5 Ounce(s) By Mouth Twice Daily   CINNAMON PO Take 2 capsules by mouth daily.   Coenzyme Q10 (COQ10) 200 MG CAPS Take 1 tablet by mouth daily.   Fish Oil OIL Take by mouth daily.   fluticasone (FLONASE) 50 MCG/ACT nasal spray Place into the nose at bedtime.   furosemide (LASIX) 20 MG tablet Take 1 tablet (20 mg total) by mouth daily. As needed for fluid retention   glipiZIDE (GLUCOTROL) 5 MG tablet TAKE 1 TABLET (5 MG TOTAL) BY MOUTH TWICE A DAY BEFORE MEALS   glucosamine-chondroitin 500-400 MG tablet Take 1 tablet by mouth in the morning and at bedtime.   MAGNESIUM CITRATE PO Take 1 capsule by mouth in the morning and at bedtime.   metFORMIN (GLUCOPHAGE-XR) 500 MG 24 hr tablet TAKE 1 TABLET BY MOUTH EVERY DAY WITH BREAKFAST   Multiple Vitamin (MULTIVITAMIN) capsule Take 1 capsule by mouth daily.    mupirocin ointment (BACTROBAN) 2 % Apply 1 application topically 2 (two) times daily.   ONETOUCH DELICA LANCETS 33G MISC Use to check blood sugars once daily.   ONETOUCH VERIO test strip USE TO CHECK SUGARS ONCE OR TWICE DAILY .   triamcinolone cream (KENALOG) 0.1 % Apply 1 Application topically 2 (two) times daily. As needed for itching   Turmeric 500 MG TABS Take 1 tablet by mouth daily.   vitamin C (ASCORBIC ACID) 500 MG tablet Take 500 mg by mouth daily.   No facility-administered medications prior to visit.   Reviewed past medical and social history.   ROS per HPI above      Objective:  BP 120/80 (BP Location: Left Arm, Patient Position: Sitting, Cuff Size: Large)   Pulse 66   Temp 98 F (36.7 C)   Resp 16   Ht 5\' 10"  (1.778 m)   Wt 215 lb (97.5 kg)   SpO2 99%   BMI 30.85 kg/m      Physical Exam Vitals and nursing note reviewed.  Constitutional:      General: He is not in acute distress. Cardiovascular:     Pulses: Normal pulses.          Dorsalis pedis pulses are 2+ on the right side.       Posterior tibial  pulses are 2+ on the right side.  Pulmonary:     Effort: Pulmonary effort is normal.  Musculoskeletal:     Right foot: Normal range of motion.       Feet:  Feet:     Right foot:     Skin integrity: Skin breakdown and callus present. No ulcer, blister, erythema, warmth, dry skin or fissure.     Toenail Condition: Right toenails are normal.     Comments: Puncture wound is clean, no foreign objected imbeded Neurological:     Mental Status: He is alert.     No results found for any visits on 08/12/23.    Assessment & Plan:    Problem List Items Addressed This Visit   None Visit Diagnoses     Puncture wound of right foot, initial encounter    -  Primary   Relevant Medications   levofloxacin (LEVAQUIN) 500 MG tablet   Other Relevant Orders   Td : Tetanus/diphtheria >7yo Preservative  free   Immunization due       Relevant Orders   Td :  Tetanus/diphtheria >7yo Preservative  free     F/up with pcp if no improvement in 1week. Keep wound clean and dry at all times  Wear shoes at all times. Use compression stocking to help with LE edema. Watch for hypoglycemic episodes due to interaction between levaquin and glipizide  Return if symptoms worsen or fail to improve.     Alysia Penna, NP

## 2023-08-12 NOTE — Patient Instructions (Addendum)
F/up with pcp if no improvement in 1week. Keep wound clean and dry at all times  Wear shoes at all times. Use compression stocking to help with LE edema. Watch for hypoglycemic episodes due to interaction between levaquin and glipizide  Puncture Wound A puncture wound is an injury that is caused by a sharp, thin object that goes through your skin. A puncture wound usually does not leave a large opening in your skin, so it may not bleed a lot. However, when you get a puncture wound, dirt or other materials (foreign bodies) can be forced into your wound and can break off inside. This increases the chance of infection, such as tetanus. There are many sharp, pointed objects that can cause puncture wounds, including teeth, nails, splinters of glass, fishhooks, and needles. Treatment may include the following steps: Washing out the wound with a germ-free (sterile) salt-water solution. Having surgery to open the wound and remove materials from it. Your wound may be: Closed with stitches (sutures). Covered with antibiotic ointment and a bandage (dressing). Depending on what caused the injury, you may also need a tetanus shot or a rabies shot. Follow these instructions at home: Medicines Take or apply over-the-counter and prescription medicines only as told by your doctor. If you were prescribed antibiotics, take or apply them as told by your doctor. Do not stop using them even if you start to feel better. Bathing Keep the bandage dry as told by your doctor. Do not take baths, swim, or use a hot tub. Ask your doctor about taking showers or sponge baths. Wound care  Follow instructions from your doctor about how to take care of your wound. Make sure you: Wash your hands with soap and water for at least 20 seconds before and after you change your bandage. If you cannot use soap and water, use hand sanitizer. Change your bandage. Leave stitches or skin glue in place for at least 2 weeks. Leave tape  strips alone unless you are told to take them off. You may trim the edges of the tape strips if they curl up. Clean the wound as told by your doctor. Do not scratch or pick at the wound. Check your wound every day for signs of infection. Watch for: Redness, swelling, or pain. Fluid or blood. Warmth. Pus or a bad smell. General instructions Raise (elevate) the injured area above the level of your heart while you are sitting or lying down. If your puncture wound is in your foot, ask your doctor if you need to avoid putting weight on your foot and for how long. Use crutches as told by your doctor. Keep all follow-up visits. Contact a doctor if: You got a tetanus shot and you have any of these problems at the injection site: Swelling. Very bad pain. Redness. Bleeding. You have any of these signs of infection in your wound area: Redness, swelling, or pain. Fluid or blood. Warmth. Pus or a bad smell. You have a fever. Your stitches come out. You notice something coming out of the wound, such as wood or glass. Medicine does not help your pain. You start to lose feeling (have numbness) around the wound. Get help right away if: You have very bad swelling around the wound. You have a red streak going away from your wound. You start to get painful skin lumps. The wound is on your hand or foot and you: Cannot move a finger or toe like normal. Notice that your fingers or toes look pale or  blue. Summary A puncture wound is an injury that is caused by a sharp, thin object that goes through your skin. Treatment may include washing out the wound and having surgery to open the wound to clean it. Follow instructions from your doctor about how to take care of your wound. Contact your doctor if you have more redness, swelling, or pain at the site of your wound. This information is not intended to replace advice given to you by your health care provider. Make sure you discuss any questions you have  with your health care provider. Document Revised: 12/26/2021 Document Reviewed: 12/26/2021 Elsevier Patient Education  2024 [employer].

## 2023-08-12 NOTE — Telephone Encounter (Signed)
noted 

## 2023-08-13 ENCOUNTER — Other Ambulatory Visit: Payer: Self-pay | Admitting: Cardiovascular Disease

## 2023-08-13 DIAGNOSIS — I10 Essential (primary) hypertension: Secondary | ICD-10-CM

## 2023-08-20 ENCOUNTER — Other Ambulatory Visit: Payer: Self-pay | Admitting: Cardiovascular Disease

## 2023-08-25 NOTE — Progress Notes (Unsigned)
Patient ID: Richard Hardy, male   DOB: [birth year], 77 y.o.   MRN: [id number] Cardiology Office Note  Date:  08/26/2023   ID:  Richard Hardy, DOB 1946/04/04, MRN [id number]  PCP:  Richard Shams, MD   Chief Complaint  Patient presents with   Follow-up    Per patient no complaints at this time    HPI:  Richard Hardy is a 77 year-old male with a history of   hyperlipidemia,  glucose intolerance, obstructive sleep apnea on CPAP,  Borderline diabetes,   Asymptomatic bradycardia strong family history of coronary artery disease  who returns for routine followup of his hyperlipidemia. 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      LOV 8/23 In follow-up today reports doing relatively well Reports difficult time getting over COVID end of December into beginning of January 2024 with hospitalization  Has restarted his exercise program walking in [place] Stress at home, wife with cancer, recent lumpectomy, needs XRT  Denies chest pain concerning for angina Reports having bilateral leg swelling Not on Lasix but does take HCTZ daily High fluid intake  Labs reviewed:  LDL 46 Total chol 91,   EKG personally reviewed by myself on todays visit EKG Interpretation Date/Time:  Tuesday August 26 2023 15:12:09 EDT Ventricular Rate:  52 PR Interval:  206 QRS Duration:  102 QT Interval:  442 QTC Calculation: 411 R Axis:   -27  Text Interpretation: Sinus bradycardia Low voltage QRS When compared with ECG of 14-Dec-2022 07:41, QRS axis Shifted left Questionable change in initial forces of Anterior leads ST no longer depressed in Anterior leads Nonspecific T wave abnormality no longer evident in Anterior leads Confirmed by Richard Hardy [phone]) on 08/26/2023 3:36:03 PM   previously worked  at [employer], long hours Remains on CPAP   seen by Richard Hardy of hematology oncology for prior drop in his platelets. Aspirin was held   PMH:   has a past medical history of  Actinic keratosis, Arthritis, Basal cell carcinoma (02/02/2020), Diabetes mellitus without complication (HCC) (2009), Dysrhythmia, Glucose intolerance (impaired glucose tolerance), Hyperkalemia (05/21/2017), Hyperlipidemia, Obesity, Sleep apnea, and Thrombocythemia.  PSH:    Past Surgical History:  Procedure Laterality Date   CATARACT EXTRACTION, BILATERAL     COLONOSCOPY WITH PROPOFOL N/A 06/23/2017   Procedure: COLONOSCOPY WITH PROPOFOL;  Surgeon: Richard Jun, MD;  Location: [facility] ENDOSCOPY;  Service: Endoscopy;  Laterality: N/A;   COLONOSCOPY WITH PROPOFOL N/A 10/16/2020   Procedure: COLONOSCOPY WITH PROPOFOL;  Surgeon: Toledo, Richard Nearing, MD;  Location: [facility] ENDOSCOPY;  Service: Gastroenterology;  Laterality: N/A;   EYE SURGERY     LITHOTRIPSY     Dr. Lonna Hardy   NASAL SEPTUM SURGERY     TONSILLECTOMY      Current Outpatient Medications  Medication Sig Dispense Refill   amLODipine (NORVASC) 5 MG tablet TAKE 1 TABLET (5 MG TOTAL) BY MOUTH DAILY. 90 tablet 0   atorvastatin (LIPITOR) 20 MG tablet TAKE 1 TABLET BY MOUTH EVERY DAY 90 tablet 0   Blood Glucose Monitoring Suppl (ONETOUCH VERIO IQ SYSTEM) w/Device KIT Use to check blood sugars once daily. 1 kit 0   chlorhexidine (PERIDEX) 0.12 % solution SMARTSIG:0.5 Ounce(s) By Mouth Twice Daily     CINNAMON PO Take 2 capsules by mouth daily.     Coenzyme Q10 (COQ10) 200 MG CAPS Take 1 tablet by mouth daily.     Fish Oil OIL Take by mouth daily.     fluticasone (FLONASE)  50 MCG/ACT nasal spray Place into the nose at bedtime.     furosemide (LASIX) 20 MG tablet Take 1 tablet (20 mg total) by mouth daily. As needed for fluid retention 30 tablet 0   glipiZIDE (GLUCOTROL) 5 MG tablet TAKE 1 TABLET (5 MG TOTAL) BY MOUTH TWICE A DAY BEFORE MEALS 180 tablet 1   glucosamine-chondroitin 500-400 MG tablet Take 1 tablet by mouth in the morning and at bedtime.     hydrochlorothiazide (HYDRODIURIL) 25 MG tablet TAKE 1 TABLET (25 MG TOTAL) BY MOUTH  DAILY. 90 tablet 0   MAGNESIUM CITRATE PO Take 1 capsule by mouth in the morning and at bedtime.     metFORMIN (GLUCOPHAGE-XR) 500 MG 24 hr tablet TAKE 1 TABLET BY MOUTH EVERY DAY WITH BREAKFAST 90 tablet 1   Multiple Vitamin (MULTIVITAMIN) capsule Take 1 capsule by mouth daily.     mupirocin ointment (BACTROBAN) 2 % Apply 1 application topically 2 (two) times daily. 30 g 2   ONETOUCH DELICA LANCETS 33G MISC Use to check blood sugars once daily. 100 each 1   ONETOUCH VERIO test strip USE TO CHECK SUGARS ONCE OR TWICE DAILY . 100 strip 1   Turmeric 500 MG TABS Take 1 tablet by mouth daily.     vitamin C (ASCORBIC ACID) 500 MG tablet Take 500 mg by mouth daily.     triamcinolone cream (KENALOG) 0.1 % Apply 1 Application topically 2 (two) times daily. As needed for itching (Patient not taking: Reported on 08/26/2023) 80 g 1   No current facility-administered medications for this visit.     Allergies:   Losartan, Lisinopril, Metformin and related, Sulfonamide derivatives, and Augmentin [amoxicillin-pot clavulanate]   Social History:  The patient  reports that he quit smoking about 59 years ago. His smoking use included cigarettes. He started smoking about 63 years ago. He has a 2 pack-year smoking history. He has never used smokeless tobacco. He reports current alcohol use. He reports that he does not use drugs.   Family History:   family history includes Cancer in his mother; Crohn's disease in his brother; Heart attack (age of onset: 82) in his father; Heart disease in his brother, father, and paternal grandfather; Hyperlipidemia in his son; Hypertension in his son; Sleep apnea in his son; Thyroid disease in his son.    Review of Systems: Review of Systems  Constitutional: Negative.   HENT: Negative.    Respiratory: Negative.    Cardiovascular: Negative.   Gastrointestinal: Negative.   Musculoskeletal: Negative.   Neurological: Negative.   Psychiatric/Behavioral: Negative.    All other  systems reviewed and are negative.   PHYSICAL EXAM: VS:  BP 136/68 (BP Location: Left Arm, Patient Position: Sitting, Cuff Size: Large)   Pulse (!) 52   Ht 5\' 10"  (1.778 m)   Wt 217 lb 9.6 oz (98.7 kg)   SpO2 95%   BMI 31.22 kg/m  , BMI Body mass index is 31.22 kg/m. Constitutional:  oriented to person, place, and time. No distress.  HENT:  Head: Grossly normal Eyes:  no discharge. No scleral icterus.  Neck: No JVD, no carotid bruits  Cardiovascular: Regular rate and rhythm, no murmurs appreciated Trace to 1+ pitting lower extremity edema Pulmonary/Chest: Clear to auscultation bilaterally, no wheezes or rails Abdominal: Soft.  no distension.  no tenderness.  Musculoskeletal: Normal range of motion Neurological:  normal muscle tone. Coordination normal. No atrophy Skin: Skin warm and dry Psychiatric: normal affect, pleasant  Recent Labs: 11/11/2022:  TSH 2.46 12/14/2022: Magnesium 1.6 12/15/2022: Hemoglobin 13.6; Platelets 83 05/14/2023: ALT 28; BUN 22; Creatinine, Ser 0.94; Potassium 4.1; Sodium 140    Lipid Panel Lab Results  Component Value Date   CHOL 91 05/14/2023   HDL 43.80 05/14/2023   LDLCALC 37 05/14/2023   TRIG 51.0 05/14/2023      Wt Readings from Last 3 Encounters:  08/26/23 217 lb 9.6 oz (98.7 kg)  08/12/23 215 lb (97.5 kg)  05/14/23 212 lb (96.2 kg)      ASSESSMENT AND PLAN:  Essential hypertension - Plan: EKG 12-Lead Leg swelling noted on exam bilaterally We have recommended he hold his amlodipine 5 mg daily Suggest Lasix 2-3 times per week in addition to his HCTZ daily Moderate fluid intake  Hyperlipemia aortic atherosclerosis on x-ray Cholesterol is at goal on the current lipid regimen. No changes to the medications were made.  Controlled type 2 diabetes mellitus without complication, without long-term current use of insulin (HCC)   A1c trending upwards, restarted exercise program Recommend he moderate his carbohydrates,  avoid sugary  drinks  Bradycardia -  Asymptomatic bradycardia No further work-up  Aortic atherosclerosis Noted on x-ray cholesterol at goal,  non-smoker Diabetes management as above  Chronic low back pain Stable   Total encounter time more than 30 minutes  Greater than 50% was spent in counseling and coordination of care with the patient      Orders Placed This Encounter  Procedures   EKG 12-Lead     Signed, Dossie Arbour, M.D., Ph.D. 08/26/2023  [facility] [place], [place] [phone]

## 2023-08-26 ENCOUNTER — Encounter: Payer: Self-pay | Admitting: Cardiovascular Disease

## 2023-08-26 ENCOUNTER — Ambulatory Visit: Payer: Medicare HMO | Attending: Cardiovascular Disease | Admitting: Cardiovascular Disease

## 2023-08-26 VITALS — BP 136/68 | HR 52 | Ht 70.0 in | Wt 217.6 lb

## 2023-08-26 DIAGNOSIS — I10 Essential (primary) hypertension: Secondary | ICD-10-CM | POA: Diagnosis not present

## 2023-08-26 DIAGNOSIS — R001 Bradycardia, unspecified: Secondary | ICD-10-CM | POA: Diagnosis not present

## 2023-08-26 DIAGNOSIS — E785 Hyperlipidemia, unspecified: Secondary | ICD-10-CM

## 2023-08-26 DIAGNOSIS — E119 Type 2 diabetes mellitus without complications: Secondary | ICD-10-CM | POA: Diagnosis not present

## 2023-08-26 DIAGNOSIS — I700 Atherosclerosis of aorta: Secondary | ICD-10-CM | POA: Diagnosis not present

## 2023-08-26 DIAGNOSIS — E1169 Type 2 diabetes mellitus with other specified complication: Secondary | ICD-10-CM | POA: Diagnosis not present

## 2023-08-26 DIAGNOSIS — Z7984 Long term (current) use of oral hypoglycemic drugs: Secondary | ICD-10-CM

## 2023-08-26 MED ORDER — FUROSEMIDE 20 MG PO TABS: 20.0000 mg | ORAL_TABLET | ORAL | 3 refills | Status: DC

## 2023-08-26 NOTE — Patient Instructions (Addendum)
Medication Instructions:  Stop amlodipine Start lasix/furosemide 20 mg two to three days a week  If you need a refill on your cardiac medications before your next appointment, please call your pharmacy.   Lab work: No new labs needed  Testing/Procedures: No new testing needed  Follow-Up: At [facility], you and your health needs are our priority.  As part of our continuing mission to provide you with exceptional heart care, we have created designated Provider Care Teams.  These Care Teams include your primary Cardiologist (physician) and Advanced Practice Providers (APPs -  Physician Assistants and Nurse Practitioners) who all work together to provide you with the care you need, when you need it.  You will need a follow up appointment in 12 months  Providers on your designated Care Team:   Nicolasa Ducking, NP Eula Listen, PA-C Cadence Fransico Michael, [place]  COVID-19 Vaccine Information can be found at: [email or web address] For questions related to vaccine distribution or appointments, please email vaccine@[email or web address] .[email or web address] or call [phone].

## 2023-08-27 DIAGNOSIS — M5441 Lumbago with sciatica, right side: Secondary | ICD-10-CM | POA: Diagnosis not present

## 2023-08-27 DIAGNOSIS — M955 Acquired deformity of pelvis: Secondary | ICD-10-CM | POA: Diagnosis not present

## 2023-08-27 DIAGNOSIS — M9901 Segmental and somatic dysfunction of cervical region: Secondary | ICD-10-CM | POA: Diagnosis not present

## 2023-08-27 DIAGNOSIS — M9905 Segmental and somatic dysfunction of pelvic region: Secondary | ICD-10-CM | POA: Diagnosis not present

## 2023-08-27 DIAGNOSIS — M9903 Segmental and somatic dysfunction of lumbar region: Secondary | ICD-10-CM | POA: Diagnosis not present

## 2023-08-27 DIAGNOSIS — M531 Cervicobrachial syndrome: Secondary | ICD-10-CM | POA: Diagnosis not present

## 2023-10-07 DIAGNOSIS — M5441 Lumbago with sciatica, right side: Secondary | ICD-10-CM | POA: Diagnosis not present

## 2023-10-07 DIAGNOSIS — M531 Cervicobrachial syndrome: Secondary | ICD-10-CM | POA: Diagnosis not present

## 2023-10-07 DIAGNOSIS — M9905 Segmental and somatic dysfunction of pelvic region: Secondary | ICD-10-CM | POA: Diagnosis not present

## 2023-10-07 DIAGNOSIS — M9901 Segmental and somatic dysfunction of cervical region: Secondary | ICD-10-CM | POA: Diagnosis not present

## 2023-10-07 DIAGNOSIS — M955 Acquired deformity of pelvis: Secondary | ICD-10-CM | POA: Diagnosis not present

## 2023-10-07 DIAGNOSIS — M9903 Segmental and somatic dysfunction of lumbar region: Secondary | ICD-10-CM | POA: Diagnosis not present

## 2023-10-09 ENCOUNTER — Encounter: Payer: Self-pay | Admitting: Internal Medicine

## 2023-10-09 MED ORDER — ONETOUCH VERIO VI STRP: ORAL_STRIP | 1 refills | Status: DC

## 2023-11-05 DIAGNOSIS — M9901 Segmental and somatic dysfunction of cervical region: Secondary | ICD-10-CM | POA: Diagnosis not present

## 2023-11-05 DIAGNOSIS — M9903 Segmental and somatic dysfunction of lumbar region: Secondary | ICD-10-CM | POA: Diagnosis not present

## 2023-11-05 DIAGNOSIS — M955 Acquired deformity of pelvis: Secondary | ICD-10-CM | POA: Diagnosis not present

## 2023-11-05 DIAGNOSIS — M9905 Segmental and somatic dysfunction of pelvic region: Secondary | ICD-10-CM | POA: Diagnosis not present

## 2023-11-05 DIAGNOSIS — M5441 Lumbago with sciatica, right side: Secondary | ICD-10-CM | POA: Diagnosis not present

## 2023-11-05 DIAGNOSIS — M531 Cervicobrachial syndrome: Secondary | ICD-10-CM | POA: Diagnosis not present

## 2023-11-09 ENCOUNTER — Other Ambulatory Visit: Payer: Self-pay | Admitting: Cardiovascular Disease

## 2023-11-09 DIAGNOSIS — I10 Essential (primary) hypertension: Secondary | ICD-10-CM

## 2023-11-13 ENCOUNTER — Ambulatory Visit: Payer: Medicare HMO | Admitting: Internal Medicine

## 2023-11-17 ENCOUNTER — Ambulatory Visit (INDEPENDENT_AMBULATORY_CARE_PROVIDER_SITE_OTHER): Payer: Medicare HMO | Admitting: Internal Medicine

## 2023-11-17 ENCOUNTER — Encounter: Payer: Self-pay | Admitting: Internal Medicine

## 2023-11-17 VITALS — BP 126/76 | HR 66 | Ht 70.0 in | Wt 215.2 lb

## 2023-11-17 DIAGNOSIS — E1169 Type 2 diabetes mellitus with other specified complication: Secondary | ICD-10-CM | POA: Diagnosis not present

## 2023-11-17 DIAGNOSIS — Z23 Encounter for immunization: Secondary | ICD-10-CM

## 2023-11-17 DIAGNOSIS — E663 Overweight: Secondary | ICD-10-CM

## 2023-11-17 DIAGNOSIS — I10 Essential (primary) hypertension: Secondary | ICD-10-CM

## 2023-11-17 DIAGNOSIS — E119 Type 2 diabetes mellitus without complications: Secondary | ICD-10-CM | POA: Diagnosis not present

## 2023-11-17 DIAGNOSIS — E785 Hyperlipidemia, unspecified: Secondary | ICD-10-CM | POA: Diagnosis not present

## 2023-11-17 DIAGNOSIS — R7401 Elevation of levels of liver transaminase levels: Secondary | ICD-10-CM

## 2023-11-17 DIAGNOSIS — Z7984 Long term (current) use of oral hypoglycemic drugs: Secondary | ICD-10-CM

## 2023-11-17 LAB — LIPID PANEL
Cholesterol: 115 mg/dL (ref 0–200)
HDL: 35.1 mg/dL — ABNORMAL LOW (ref >39.00)
LDL Cholesterol: 60 mg/dL (ref 0–99)
NonHDL: 80.35
Total CHOL/HDL Ratio: 3
Triglycerides: 102 mg/dL (ref 0.0–149.0)
VLDL: 20.4 mg/dL (ref 0.0–40.0)

## 2023-11-17 LAB — CBC WITH DIFFERENTIAL/PLATELET
Basophils Absolute: 0 10*3/uL (ref 0.0–0.1)
Basophils Relative: 0.8 % (ref 0.0–3.0)
Eosinophils Absolute: 0.1 10*3/uL (ref 0.0–0.7)
Eosinophils Relative: 2.6 % (ref 0.0–5.0)
HCT: 48 % (ref 39.0–52.0)
Hemoglobin: 15.9 g/dL (ref 13.0–17.0)
Lymphocytes Relative: 32 % (ref 12.0–46.0)
Lymphs Abs: 1.6 10*3/uL (ref 0.7–4.0)
MCHC: 33 g/dL (ref 30.0–36.0)
MCV: 98.2 fL (ref 78.0–100.0)
Monocytes Absolute: 0.5 10*3/uL (ref 0.1–1.0)
Monocytes Relative: 9.2 % (ref 3.0–12.0)
Neutro Abs: 2.8 10*3/uL (ref 1.4–7.7)
Neutrophils Relative %: 55.4 % (ref 43.0–77.0)
Platelets: 159 10*3/uL (ref 150.0–400.0)
RBC: 4.89 Mil/uL (ref 4.22–5.81)
RDW: 13.6 % (ref 11.5–15.5)
WBC: 5 10*3/uL (ref 4.0–10.5)

## 2023-11-17 LAB — COMPREHENSIVE METABOLIC PANEL
ALT: 36 U/L (ref 0–53)
AST: 26 U/L (ref 0–37)
Albumin: 4.4 g/dL (ref 3.5–5.2)
Alkaline Phosphatase: 60 U/L (ref 39–117)
BUN: 20 mg/dL (ref 6–23)
CO2: 30 meq/L (ref 19–32)
Calcium: 9.5 mg/dL (ref 8.4–10.5)
Chloride: 104 meq/L (ref 96–112)
Creatinine, Ser: 1.04 mg/dL (ref 0.40–1.50)
GFR: 68.15 mL/min (ref >60.00)
Glucose, Bld: 135 mg/dL — ABNORMAL HIGH (ref 70–99)
Potassium: 4.7 meq/L (ref 3.5–5.1)
Sodium: 142 meq/L (ref 135–145)
Total Bilirubin: 0.9 mg/dL (ref 0.2–1.2)
Total Protein: 6.7 g/dL (ref 6.0–8.3)

## 2023-11-17 LAB — TSH: TSH: 2.81 u[IU]/mL (ref 0.35–5.50)

## 2023-11-17 LAB — HEMOGLOBIN A1C: Hgb A1c MFr Bld: 7.5 % — ABNORMAL HIGH (ref 4.6–6.5)

## 2023-11-17 LAB — LDL CHOLESTEROL, DIRECT: Direct LDL: 63 mg/dL

## 2023-11-17 MED ORDER — GLIPIZIDE 10 MG PO TABS: 10.0000 mg | ORAL_TABLET | Freq: Two times a day (BID) | ORAL | 1 refills | Status: DC

## 2023-11-17 MED ORDER — METFORMIN HCL ER 500 MG PO TB24: ORAL_TABLET | ORAL | 1 refills | Status: DC

## 2023-11-17 NOTE — Assessment & Plan Note (Signed)
Noted during treatment with Paxlovid. Repeat liver enzymes have normalized Lab Results  Component Value Date   ALT 36 11/17/2023   AST 26 11/17/2023   ALKPHOS 60 11/17/2023   BILITOT 0.9 11/17/2023

## 2023-11-17 NOTE — Assessment & Plan Note (Signed)
Tolerating atorvastatin  Lab Results  Component Value Date   CHOL 115 11/17/2023   HDL 35.10 (L) 11/17/2023   LDLCALC 60 11/17/2023   LDLDIRECT 63.0 11/17/2023   TRIG 102.0 11/17/2023   CHOLHDL 3 11/17/2023

## 2023-11-17 NOTE — Progress Notes (Signed)
Subjective:  Patient ID: Richard Hardy, male    DOB: 11-Mar-1946  Age: 77 y.o. MRN: [id number]  CC: The primary encounter diagnosis was Primary hypertension. Diagnoses of Hyperlipidemia due to type 2 diabetes mellitus (HCC), Controlled type 2 diabetes mellitus without complication, without long-term current use of insulin (HCC), Overweight (BMI 25.0-29.9), Flu vaccine need, and Transaminitis were also pertinent to this visit.   HPI [name] presents for  Chief Complaint  Patient presents with   Medical Management of Chronic Issues    6 month follow up    1) type 2 DM:  he has been having trouble sticking to his low GI diet due to well meaning neighbors and friends eho have been droppig of casseroles and dinners while his wife Richard Hardy is undergoing treatment ofr breast cancer. He has recently resumed a walking program.  Recent BS were high,  200 fasting recently (  2)  GAD:  HIS 2ND WIFE Richard Hardy WAS DIAGNOSED WITH BREAST CANCER THIS SUMMER AND IS UNDERGOING TREATMENT .   HIS FIRST WIFE DIED OF METASTATIC BRCA 27 YEARS AGO DESPITE   AGGRESSIVE TREATMENT AT [facility]   3) HTN :  amlodipine  stopped due to persistent edema aggravating his venous insufficiency  stopped by Dr Mariah Milling.  Using bp controlled with hydrochlorothiazide  Home readings have been 130/80 or less    Lab Results  Component Value Date   HGBA1C 7.5 (H) 11/17/2023     Outpatient Medications Prior to Visit  Medication Sig Dispense Refill   atorvastatin (LIPITOR) 20 MG tablet TAKE 1 TABLET BY MOUTH EVERY DAY 90 tablet 0   Blood Glucose Monitoring Suppl (ONETOUCH VERIO IQ SYSTEM) w/Device KIT Use to check blood sugars once daily. 1 kit 0   chlorhexidine (PERIDEX) 0.12 % solution SMARTSIG:0.5 Ounce(s) By Mouth Twice Daily     CINNAMON PO Take 2 capsules by mouth daily.     Coenzyme Q10 (COQ10) 200 MG CAPS Take 1 tablet by mouth daily.     Fish Oil OIL Take by mouth daily.     fluticasone (FLONASE) 50 MCG/ACT nasal spray Place  into the nose at bedtime.     furosemide (LASIX) 20 MG tablet Take 1 tablet (20 mg total) by mouth 3 (three) times a week. 36 tablet 3   glucosamine-chondroitin 500-400 MG tablet Take 1 tablet by mouth in the morning and at bedtime.     glucose blood (ONETOUCH VERIO) test strip USE TO CHECK SUGARS ONCE OR TWICE DAILY . 100 strip 1   hydrochlorothiazide (HYDRODIURIL) 25 MG tablet TAKE 1 TABLET (25 MG TOTAL) BY MOUTH DAILY. 90 tablet 3   MAGNESIUM CITRATE PO Take 1 capsule by mouth in the morning and at bedtime.     Multiple Vitamin (MULTIVITAMIN) capsule Take 1 capsule by mouth daily.     mupirocin ointment (BACTROBAN) 2 % Apply 1 application topically 2 (two) times daily. 30 g 2   ONETOUCH DELICA LANCETS 33G MISC Use to check blood sugars once daily. 100 each 1   triamcinolone cream (KENALOG) 0.1 % Apply 1 Application topically 2 (two) times daily. As needed for itching 80 g 1   Turmeric 500 MG TABS Take 1 tablet by mouth daily.     vitamin C (ASCORBIC ACID) 500 MG tablet Take 500 mg by mouth daily.     glipiZIDE (GLUCOTROL) 5 MG tablet TAKE 1 TABLET (5 MG TOTAL) BY MOUTH TWICE A DAY BEFORE MEALS 180 tablet 1   metFORMIN (GLUCOPHAGE-XR)  500 MG 24 hr tablet TAKE 1 TABLET BY MOUTH EVERY DAY WITH BREAKFAST 90 tablet 1   No facility-administered medications prior to visit.    Review of Systems;  Patient denies headache, fevers, malaise, unintentional weight loss, skin rash, eye pain, sinus congestion and sinus pain, sore throat, dysphagia,  hemoptysis , cough, dyspnea, wheezing, chest pain, palpitations, orthopnea, edema, abdominal pain, nausea, melena, diarrhea, constipation, flank pain, dysuria, hematuria, urinary  Frequency, nocturia, numbness, tingling, seizures,  Focal weakness, Loss of consciousness,  Tremor, insomnia, depression, anxiety, and suicidal ideation.      Objective:  BP 126/76   Pulse 66   Ht 5\' 10"  (1.778 m)   Wt 215 lb 3.2 oz (97.6 kg)   SpO2 95%   BMI 30.88 kg/m   BP  Readings from Last 3 Encounters:  11/17/23 126/76  08/26/23 136/68  08/12/23 120/80    Wt Readings from Last 3 Encounters:  11/17/23 215 lb 3.2 oz (97.6 kg)  08/26/23 217 lb 9.6 oz (98.7 kg)  08/12/23 215 lb (97.5 kg)    Physical Exam Vitals reviewed.  Constitutional:      General: He is not in acute distress.    Appearance: Normal appearance. He is normal weight. He is not ill-appearing, toxic-appearing or diaphoretic.  HENT:     Head: Normocephalic.  Eyes:     General: No scleral icterus.       Right eye: No discharge.        Left eye: No discharge.     Conjunctiva/sclera: Conjunctivae normal.  Cardiovascular:     Rate and Rhythm: Normal rate and regular rhythm.     Heart sounds: Normal heart sounds.  Pulmonary:     Effort: Pulmonary effort is normal. No respiratory distress.     Breath sounds: Normal breath sounds.  Musculoskeletal:        General: Normal range of motion.     Cervical back: Normal range of motion.  Skin:    General: Skin is warm and dry.  Neurological:     General: No focal deficit present.     Mental Status: He is alert and oriented to person, place, and time. Mental status is at baseline.  Psychiatric:        Mood and Affect: Mood normal.        Behavior: Behavior normal.        Thought Content: Thought content normal.        Judgment: Judgment normal.    Lab Results  Component Value Date   HGBA1C 7.5 (H) 11/17/2023   HGBA1C 7.3 (H) 05/14/2023   HGBA1C 6.9 (H) 11/11/2022    Lab Results  Component Value Date   CREATININE 1.04 11/17/2023   CREATININE 0.94 05/14/2023   CREATININE 0.93 02/06/2023    Lab Results  Component Value Date   WBC 5.0 11/17/2023   HGB 15.9 11/17/2023   HCT 48.0 11/17/2023   PLT 159.0 11/17/2023   GLUCOSE 135 (H) 11/17/2023   CHOL 115 11/17/2023   TRIG 102.0 11/17/2023   HDL 35.10 (L) 11/17/2023   LDLDIRECT 63.0 11/17/2023   LDLCALC 60 11/17/2023   ALT 36 11/17/2023   AST 26 11/17/2023   NA 142  11/17/2023   K 4.7 11/17/2023   CL 104 11/17/2023   CREATININE 1.04 11/17/2023   BUN 20 11/17/2023   CO2 30 11/17/2023   TSH 2.81 11/17/2023   PSA 0.32 11/10/2019   HGBA1C 7.5 (H) 11/17/2023   MICROALBUR 1.5 05/14/2023  No results found.  Assessment & Plan:  .Primary hypertension Assessment & Plan: Well controlled on current regimen of hca and amlodipine was stopped  .He is ACE/ARB intolerant.   Orders: -     Comprehensive metabolic panel  Hyperlipidemia due to type 2 diabetes mellitus (HCC) Assessment & Plan: Tolerating atorvastatin  Lab Results  Component Value Date   CHOL 115 11/17/2023   HDL 35.10 (L) 11/17/2023   LDLCALC 60 11/17/2023   LDLDIRECT 63.0 11/17/2023   TRIG 102.0 11/17/2023   CHOLHDL 3 11/17/2023     Orders: -     Lipid panel -     LDL cholesterol, direct  Controlled type 2 diabetes mellitus without complication, without long-term current use of insulin (HCC) Assessment & Plan: Loss of control noted over the last 6 months .  Will increase glipizide to 10 mg bid   Orders: -     Hemoglobin A1c -     Comprehensive metabolic panel  Overweight (BMI 25.0-29.9) -     CBC with Differential/Platelet -     TSH  Flu vaccine need -     Flu Vaccine Trivalent High Dose (Fluad)  Transaminitis Assessment & Plan: Noted during treatment with Paxlovid. Repeat liver enzymes have normalized Lab Results  Component Value Date   ALT 36 11/17/2023   AST 26 11/17/2023   ALKPHOS 60 11/17/2023   BILITOT 0.9 11/17/2023      Other orders -     metFORMIN HCl ER; TAKE 1 TABLET BY MOUTH EVERY DAY WITH BREAKFAST  Dispense: 90 tablet; Refill: 1 -     glipiZIDE; Take 1 tablet (10 mg total) by mouth 2 (two) times daily before a meal.  Dispense: 180 tablet; Refill: 1     I provided 30 minutes of face-to-face time during this encounter reviewing patient's last visit with me, patient's  most recent visit with cardiologgy,  recent surgical and non surgical  procedures, previous  labs and imaging studies, counseling on currently addressed issues,  and post visit ordering to diagnostics and therapeutics .   Follow-up: No follow-ups on file.   Sherlene Shams, MD

## 2023-11-17 NOTE — Assessment & Plan Note (Signed)
Loss of control noted over the last 6 months .  Will increase glipizide to 10 mg bid

## 2023-11-17 NOTE — Patient Instructions (Signed)
We may increase the glipizide dose   depending your A1c today (if it's over 7)  Limit your popcorn snacks to a 100 calorie serving (like skinny POP  Go for a walk daily  Best wishes to you and Harriett Sine for a warm ad wonderful Christmas holiday!

## 2023-11-17 NOTE — Assessment & Plan Note (Signed)
Well controlled on current regimen of hca and amlodipine was stopped  .He is ACE/ARB intolerant.

## 2023-11-21 ENCOUNTER — Other Ambulatory Visit: Payer: Self-pay | Admitting: Cardiovascular Disease

## 2023-12-14 ENCOUNTER — Encounter: Payer: Self-pay | Admitting: Internal Medicine

## 2023-12-15 ENCOUNTER — Ambulatory Visit (INDEPENDENT_AMBULATORY_CARE_PROVIDER_SITE_OTHER): Payer: Medicare HMO | Admitting: Internal Medicine

## 2023-12-15 ENCOUNTER — Encounter: Payer: Self-pay | Admitting: Internal Medicine

## 2023-12-15 ENCOUNTER — Telehealth: Payer: Self-pay

## 2023-12-15 ENCOUNTER — Ambulatory Visit: Payer: Medicare HMO | Admitting: Family

## 2023-12-15 VITALS — BP 144/82 | HR 51 | Temp 98.1°F | Ht 70.0 in | Wt 219.0 lb

## 2023-12-15 DIAGNOSIS — J069 Acute upper respiratory infection, unspecified: Secondary | ICD-10-CM | POA: Diagnosis not present

## 2023-12-15 MED ORDER — AMOXICILLIN-POT CLAVULANATE 875-125 MG PO TABS: 1.0000 | ORAL_TABLET | Freq: Two times a day (BID) | ORAL | 0 refills | Status: DC

## 2023-12-15 NOTE — Assessment & Plan Note (Signed)
-  Patient presented to the office with nasal congestion/rhinorrhea, hoarse voice and cough productive of yellowish phlegm over the last week -Patient denies any fevers or chills -His lung exam is benign and he has no sinus tenderness. -I suspect that his symptoms secondary to an acute viral URI with cough.  He is out of the window for Tamiflu and Paxlovid  and there is no indication to test for these at this time -I advocated conservative management with Nettie pot and warm showers and stimulation and continuing with Allegra as well as Mucinex .  I cautioned him against using the decongestant version of these medications.  I also encouraged him to use the sugar-free version of Mucinex  given his history of diabetes. -Patient states that he was admitted to the hospital last year for pneumonia and noted that his symptoms began the same way at that time.  He is worried that he will develop a superimposed pneumonia. -I have prescribed him Augmentin  to use if his symptoms worsen (starting to develop fevers, worsening cough and congestion) and to give us  a call.  He understands not to use this medication currently -No further workup at this time

## 2023-12-15 NOTE — Patient Instructions (Signed)
-  It was a pleasure meeting you today -I do believe that you have an acute viral infection that is causing your nasal congestion and cough.  Because your symptoms have been there for almost a week you are out of the window for a lot of antiviral medications including those for influenza and COVID -We will prescribe Augmentin  for you.  If you notice you have worsening cough or fevers or symptoms are not improving please start Augmentin  and give us  a call -Please continue conservative measures like Nettie pot and taking warm showers (not too hot) and doing some steam inhalation -Continue with Allegra and Mucinex .  Do not take the D version of these medications and try to obtain the sugar-free version of Mucinex  -Please call us  with any questions or concerns

## 2023-12-15 NOTE — Progress Notes (Signed)
 Acute Office Visit  Subjective:     Patient ID: Richard Hardy, male    DOB: 02-Oct-1946, 78 y.o.   MRN: [id number]  Chief Complaint  Patient presents with   Acute Visit    Congestion    HPI Patient is in today for for follow-up for nasal congestion and cough.  Patient states that approximately 1 week ago he developed worsening cough productive of yellowish phlegm, nasal congestion and rhinorrhea as well as hoarseness of his voice over the last couple days.  He states that he thought his symptoms would improve but given that they have persisted he came to the office for further evaluation.  He denies any fevers or chills.  No shortness of breath but does complain of occasional wheezing.  Review of Systems  Constitutional:  Negative for chills, fever and malaise/fatigue.  HENT:  Positive for congestion. Negative for sinus pain and sore throat.   Respiratory:  Positive for cough, sputum production (Yellowish phlegm) and wheezing (Occasional wheezing). Negative for shortness of breath.   Cardiovascular: Negative.   Musculoskeletal:  Negative for myalgias.  Neurological: Negative.   Psychiatric/Behavioral: Negative.          Objective:    BP (!) 144/82   Pulse (!) 51   Temp 98.1 F (36.7 C)   Ht 5' 10 (1.778 m)   Wt 219 lb (99.3 kg)   SpO2 96%   BMI 31.42 kg/m    Physical Exam Constitutional:      Appearance: Normal appearance.  HENT:     Head: Normocephalic and atraumatic.     Nose: Rhinorrhea present.     Mouth/Throat:     Pharynx: Oropharynx is clear. No oropharyngeal exudate or posterior oropharyngeal erythema.  Cardiovascular:     Rate and Rhythm: Normal rate and regular rhythm.     Heart sounds: Normal heart sounds.  Pulmonary:     Effort: Pulmonary effort is normal. No respiratory distress.     Breath sounds: Normal breath sounds. No wheezing, rhonchi or rales.  Neurological:     Mental Status: He is alert.  Psychiatric:        Mood and Affect: Mood  normal.        Behavior: Behavior normal.    No results found for any visits on 12/15/23.      Assessment & Plan:   Problem List Items Addressed This Visit       Respiratory   Viral URI with cough - Primary   -Patient presented to the office with nasal congestion/rhinorrhea, hoarse voice and cough productive of yellowish phlegm over the last week -Patient denies any fevers or chills -His lung exam is benign and he has no sinus tenderness. -I suspect that his symptoms secondary to an acute viral URI with cough.  He is out of the window for Tamiflu and Paxlovid  and there is no indication to test for these at this time -I advocated conservative management with Nettie pot and warm showers and stimulation and continuing with Allegra as well as Mucinex .  I cautioned him against using the decongestant version of these medications.  I also encouraged him to use the sugar-free version of Mucinex  given his history of diabetes. -Patient states that he was admitted to the hospital last year for pneumonia and noted that his symptoms began the same way at that time.  He is worried that he will develop a superimposed pneumonia. -I have prescribed him Augmentin  to use if his symptoms worsen (starting to  develop fevers, worsening cough and congestion) and to give us  a call.  He understands not to use this medication currently -No further workup at this time       Meds ordered this encounter  Medications   amoxicillin -clavulanate (AUGMENTIN ) 875-125 MG tablet    Sig: Take 1 tablet by mouth 2 (two) times daily.    Dispense:  14 tablet    Refill:  0    No follow-ups on file.  Sante Biedermann, MD

## 2023-12-15 NOTE — Telephone Encounter (Signed)
 LVM to call back to reschedule appt for 12/15/2023

## 2023-12-15 NOTE — Telephone Encounter (Signed)
 Spoke with pt's wife and scheduled pt for today at 1 pm with Dr. Heide Spark.

## 2023-12-16 ENCOUNTER — Ambulatory Visit: Payer: Medicare HMO | Admitting: Internal Medicine

## 2023-12-16 ENCOUNTER — Encounter: Payer: Self-pay | Admitting: Internal Medicine

## 2023-12-16 ENCOUNTER — Other Ambulatory Visit: Payer: Self-pay | Admitting: Internal Medicine

## 2023-12-16 MED ORDER — AZITHROMYCIN 500 MG PO TABS: 500.0000 mg | ORAL_TABLET | Freq: Every day | ORAL | 0 refills | Status: DC

## 2023-12-17 DIAGNOSIS — M9903 Segmental and somatic dysfunction of lumbar region: Secondary | ICD-10-CM | POA: Diagnosis not present

## 2023-12-17 DIAGNOSIS — M545 Low back pain, unspecified: Secondary | ICD-10-CM | POA: Diagnosis not present

## 2023-12-17 DIAGNOSIS — M9902 Segmental and somatic dysfunction of thoracic region: Secondary | ICD-10-CM | POA: Diagnosis not present

## 2023-12-17 DIAGNOSIS — M531 Cervicobrachial syndrome: Secondary | ICD-10-CM | POA: Diagnosis not present

## 2023-12-17 DIAGNOSIS — M546 Pain in thoracic spine: Secondary | ICD-10-CM | POA: Diagnosis not present

## 2023-12-17 DIAGNOSIS — M9901 Segmental and somatic dysfunction of cervical region: Secondary | ICD-10-CM | POA: Diagnosis not present

## 2024-01-19 DIAGNOSIS — R339 Retention of urine, unspecified: Secondary | ICD-10-CM | POA: Diagnosis not present

## 2024-01-20 DIAGNOSIS — M531 Cervicobrachial syndrome: Secondary | ICD-10-CM | POA: Diagnosis not present

## 2024-01-20 DIAGNOSIS — M9902 Segmental and somatic dysfunction of thoracic region: Secondary | ICD-10-CM | POA: Diagnosis not present

## 2024-01-20 DIAGNOSIS — M9901 Segmental and somatic dysfunction of cervical region: Secondary | ICD-10-CM | POA: Diagnosis not present

## 2024-01-20 DIAGNOSIS — M546 Pain in thoracic spine: Secondary | ICD-10-CM | POA: Diagnosis not present

## 2024-01-20 DIAGNOSIS — M9903 Segmental and somatic dysfunction of lumbar region: Secondary | ICD-10-CM | POA: Diagnosis not present

## 2024-01-20 DIAGNOSIS — M545 Low back pain, unspecified: Secondary | ICD-10-CM | POA: Diagnosis not present

## 2024-01-22 DIAGNOSIS — Z961 Presence of intraocular lens: Secondary | ICD-10-CM | POA: Diagnosis not present

## 2024-01-22 DIAGNOSIS — H538 Other visual disturbances: Secondary | ICD-10-CM | POA: Diagnosis not present

## 2024-01-22 DIAGNOSIS — E119 Type 2 diabetes mellitus without complications: Secondary | ICD-10-CM | POA: Diagnosis not present

## 2024-01-22 DIAGNOSIS — D3131 Benign neoplasm of right choroid: Secondary | ICD-10-CM | POA: Diagnosis not present

## 2024-01-22 LAB — HM DIABETES EYE EXAM

## 2024-01-27 ENCOUNTER — Ambulatory Visit (INDEPENDENT_AMBULATORY_CARE_PROVIDER_SITE_OTHER): Payer: Medicare HMO | Admitting: *Deleted

## 2024-01-27 VITALS — Ht 71.0 in | Wt 211.0 lb

## 2024-01-27 DIAGNOSIS — Z0000 Encounter for general adult medical examination without abnormal findings: Secondary | ICD-10-CM

## 2024-01-27 NOTE — Progress Notes (Signed)
 Subjective:   Richard Hardy is a 78 y.o. male who presents for Medicare Annual/Subsequent preventive examination.  Visit Complete: Virtual I connected with  Richard Hardy on 01/27/24 by a audio enabled telemedicine application and verified that I am speaking with the correct person using two identifiers. This patient declined Interactive audio and Acupuncturist. Therefore the visit was completed with audio only.   Patient Location: Home  Provider Location: Office/Clinic  I discussed the limitations of evaluation and management by telemedicine. The patient expressed understanding and agreed to proceed.  Vital Signs: Because this visit was a virtual/telehealth visit, some criteria may be missing or patient reported. Any vitals not documented were not able to be obtained and vitals that have been documented are patient reported.  Patient Medicare AWV questionnaire was completed by the patient on 01/26/24; I have confirmed that all information answered by patient is correct and no changes since this date.  Cardiac Risk Factors include: advanced age (>54men, >60 women);diabetes mellitus;dyslipidemia;male gender     Objective:    Today's Vitals   01/27/24 1300  Weight: 211 lb (95.7 kg)  Height: 5\' 11"  (1.803 m)   Body mass index is 29.43 kg/m.     01/27/2024    1:13 PM 01/24/2023    8:32 AM 12/15/2022    7:45 AM 11/07/2021    8:37 AM 10/16/2020    1:09 PM 07/12/2020    2:14 PM 07/12/2019   10:25 AM  Advanced Directives  Does Patient Have a Medical Advance Directive? Yes Yes No Yes Yes Yes Yes  Type of Estate agent of [place];Living will Healthcare Power of [place];Living will  Healthcare Power of [place];Living will Out of facility DNR (pink MOST or yellow form) Healthcare Power of [place];Living will Healthcare Power of [place];Living will  Does patient want to make changes to medical advance directive? No - Patient declined No - Patient declined  No  - Patient declined  No - Patient declined No - Patient declined  Copy of Healthcare Power of Attorney in Chart? Yes - validated most recent copy scanned in chart (See row information) Yes - validated most recent copy scanned in chart (See row information)  Yes - validated most recent copy scanned in chart (See row information)  Yes - validated most recent copy scanned in chart (See row information) Yes - validated most recent copy scanned in chart (See row information)    Current Medications (verified) Outpatient Encounter Medications as of 01/27/2024  Medication Sig   atorvastatin (LIPITOR) 20 MG tablet TAKE 1 TABLET BY MOUTH EVERY DAY   Blood Glucose Monitoring Suppl (ONETOUCH VERIO IQ SYSTEM) w/Device KIT Use to check blood sugars once daily.   Coenzyme Q10 (COQ10) 200 MG CAPS Take 1 tablet by mouth daily.   Fish Oil OIL Take by mouth daily.   fluticasone (FLONASE) 50 MCG/ACT nasal spray Place into the nose at bedtime.   furosemide (LASIX) 20 MG tablet Take 1 tablet (20 mg total) by mouth 3 (three) times a week.   glipiZIDE (GLUCOTROL) 10 MG tablet Take 1 tablet (10 mg total) by mouth 2 (two) times daily before a meal.   glucosamine-chondroitin 500-400 MG tablet Take 1 tablet by mouth in the morning and at bedtime.   glucose blood (ONETOUCH VERIO) test strip USE TO CHECK SUGARS ONCE OR TWICE DAILY .   hydrochlorothiazide (HYDRODIURIL) 25 MG tablet TAKE 1 TABLET (25 MG TOTAL) BY MOUTH DAILY.   metFORMIN (GLUCOPHAGE-XR) 500 MG 24 hr  tablet TAKE 1 TABLET BY MOUTH EVERY DAY WITH BREAKFAST   Multiple Vitamin (MULTIVITAMIN) capsule Take 1 capsule by mouth daily.   ONETOUCH DELICA LANCETS 33G MISC Use to check blood sugars once daily.   Turmeric 500 MG TABS Take 1 tablet by mouth daily.   vitamin C (ASCORBIC ACID) 500 MG tablet Take 500 mg by mouth daily.   [DISCONTINUED] azithromycin (ZITHROMAX) 500 MG tablet Take 1 tablet (500 mg total) by mouth daily.   [DISCONTINUED] chlorhexidine (PERIDEX)  0.12 % solution SMARTSIG:0.5 Ounce(s) By Mouth Twice Daily   [DISCONTINUED] CINNAMON PO Take 2 capsules by mouth daily.   [DISCONTINUED] MAGNESIUM CITRATE PO Take 1 capsule by mouth in the morning and at bedtime.   [DISCONTINUED] mupirocin ointment (BACTROBAN) 2 % Apply 1 application topically 2 (two) times daily.   [DISCONTINUED] triamcinolone cream (KENALOG) 0.1 % Apply 1 Application topically 2 (two) times daily. As needed for itching   No facility-administered encounter medications on file as of 01/27/2024.    Allergies (verified) Losartan, Amlodipine, Lisinopril, Metformin and related, Sulfonamide derivatives, and Augmentin [amoxicillin-pot clavulanate]   History: Past Medical History:  Diagnosis Date   Actinic keratosis    Arthritis    Basal cell carcinoma 02/02/2020   L prox med calf - ED&C    Diabetes mellitus without complication (HCC) 2009   Dysrhythmia    Glucose intolerance (impaired glucose tolerance)    Hyperkalemia 05/21/2017   Secondary to ARB   Hyperlipidemia    Obesity    Sleep apnea    On BiPAP, Dr. Willeen Cass   Thrombocythemia    Dr. Orlie Dakin   Past Surgical History:  Procedure Laterality Date   CATARACT EXTRACTION, BILATERAL     COLONOSCOPY WITH PROPOFOL N/A 06/23/2017   Procedure: COLONOSCOPY WITH PROPOFOL;  Surgeon: Scot Jun, MD;  Location: [facility] ENDOSCOPY;  Service: Endoscopy;  Laterality: N/A;   COLONOSCOPY WITH PROPOFOL N/A 10/16/2020   Procedure: COLONOSCOPY WITH PROPOFOL;  Surgeon: Toledo, Boykin Nearing, MD;  Location: [facility] ENDOSCOPY;  Service: Gastroenterology;  Laterality: N/A;   EYE SURGERY     LITHOTRIPSY     Dr. Lonna Cobb   NASAL SEPTUM SURGERY     TONSILLECTOMY     Family History  Problem Relation Age of Onset   Cancer Mother        brain   Heart attack Father 61   Heart disease Father    Heart disease Brother        s/p CABG   Crohn's disease Brother    Hyperlipidemia Son    Hypertension Son    Sleep apnea Son    Thyroid disease  Son    Heart disease Paternal Grandfather    Social History   Socioeconomic History   Marital status: Married    Spouse name: Not on file   Number of children: Not on file   Years of education: Not on file   Highest education level: Not on file  Occupational History   Not on file  Tobacco Use   Smoking status: Former    Current packs/day: 0.00    Average packs/day: 0.5 packs/day for 4.0 years (2.0 ttl pk-yrs)    Types: Cigarettes    Start date: 12/10/1959    Quit date: 12/10/1963    Years since quitting: 60.1   Smokeless tobacco: Never  Vaping Use   Vaping status: Never Used  Substance and Sexual Activity   Alcohol use: Yes    Comment: Occasional-approx 4 beers per month  Drug use: No   Sexual activity: Not on file  Other Topics Concern   Not on file  Social History Narrative   Lives in [place] with wife. 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      Diet - regular, healthy      Exercise - walks with work and goes to the [employer]: Low Risk  (01/27/2024)   Overall Financial Resource Strain (CARDIA)    Difficulty of Paying Living Expenses: Not hard at all  Food Insecurity: No Food Insecurity (01/27/2024)   Hunger Vital Sign    Worried About Running Out of Food in the Last Year: Never true    Ran Out of Food in the Last Year: Never true  Transportation Needs: No Transportation Needs (01/27/2024)   PRAPARE - Administrator, Civil Service (Medical): No    Lack of Transportation (Non-Medical): No  Physical Activity: Sufficiently Active (01/27/2024)   Exercise Vital Sign    Days of Exercise per Week: 5 days    Minutes of Exercise per Session: 60 min  Stress: No Stress Concern Present (01/27/2024)   [employer] of Occupational Health - Occupational Stress Questionnaire    Feeling of Stress : Not at all  Social Connections: Socially Integrated (01/27/2024)   Social  Connection and Isolation Panel [NHANES]    Frequency of Communication with Friends and Family: More than three times a week    Frequency of Social Gatherings with Friends and Family: Twice a week    Attends Religious Services: More than 4 times per year    Active Member of [employer] or Organizations: Yes    Attends Engineer, structural: More than 4 times per year    Marital Status: Married    Tobacco Counseling Counseling given: Not Answered   Clinical Intake:  Pre-visit preparation completed: Yes  Pain : No/denies pain     BMI - recorded: 29.43 Nutritional Status: BMI 25 -29 Overweight Nutritional Risks: None Diabetes: Yes CBG done?: No Did pt. bring in CBG monitor from home?: No  How often do you need to have someone help you when you read instructions, pamphlets, or other written materials from your doctor or pharmacy?: 1 - Never  Interpreter Needed?: No  Information entered by :: R. Andre Gallego LPN   Activities of Daily Living    01/26/2024    9:39 PM  In your present state of health, do you have any difficulty performing the following activities:  Hearing? 0   Vision? 0   Difficulty concentrating or making decisions? 0   Walking or climbing stairs? 0   Dressing or bathing? 0   Doing errands, shopping? 0   Preparing Food and eating ? N   Using the Toilet? N   In the past six months, have you accidently leaked urine? N   Do you have problems with loss of bowel control? N   Managing your Medications? N   Managing your Finances? N   Housekeeping or managing your Housekeeping? N      Proxy-reported    Patient Care Team: Sherlene Shams, MD as PCP - General (Internal Medicine) Antonieta Iba, MD as PCP - Cardiology (Cardiology) Shelia Media, MD (Internal Medicine) Antonieta Iba, MD as Consulting Physician (Cardiology)  Indicate any recent Medical Services you may have received from other than [facility] providers in the past year (  date may be  approximate).     Assessment:   This is a routine wellness examination for [facility].  Hearing/Vision screen Hearing Screening - Comments:: No issues Vision Screening - Comments:: Glasses    Goals Addressed             This Visit's Progress    Patient Stated       Wants to continue to walk and play golf       Depression Screen    01/27/2024    1:09 PM 12/15/2023   12:54 PM 11/17/2023   11:28 AM 05/14/2023    8:08 AM 01/24/2023    8:31 AM 12/23/2022   11:17 AM 11/11/2022    8:13 AM  PHQ 2/9 Scores  PHQ - 2 Score 0 0 0 0 0 0 0  PHQ- 9 Score  0  0       Fall Risk    01/26/2024    9:39 PM 12/15/2023   12:54 PM 11/17/2023   11:28 AM 05/14/2023    8:08 AM 01/24/2023    8:32 AM  Fall Risk   Falls in the past year? 0  0 0 0 0  Number falls in past yr: 0  0 0 0 0  Injury with Fall? 0  0 0 0 0  Risk for fall due to : No Fall Risks No Fall Risks No Fall Risks No Fall Risks   Follow up Falls prevention discussed;Falls evaluation completed Falls evaluation completed Falls evaluation completed  Falls evaluation completed;Falls prevention discussed     Proxy-reported    MEDICARE RISK AT HOME: Medicare Risk at Home Any stairs in or around the home?: (Proxy-Rptd) Yes If so, are there any without handrails?: (Proxy-Rptd) No Home free of loose throw rugs in walkways, pet beds, electrical cords, etc?: (Proxy-Rptd) Yes Adequate lighting in your home to reduce risk of falls?: (Proxy-Rptd) Yes Life alert?: (Proxy-Rptd) No Use of a cane, walker or w/c?: (Proxy-Rptd) No Grab bars in the bathroom?: (Proxy-Rptd) No Shower chair or bench in shower?: (Proxy-Rptd) No Elevated toilet seat or a handicapped toilet?: (Proxy-Rptd) No   Cognitive Function:    04/29/2018    8:50 AM 04/28/2017    8:39 AM  MMSE - Mini Mental State Exam  Orientation to time 5 5  Orientation to Place 5 5  Registration 3 3  Attention/ Calculation 5 5  Recall 3 3  Language- name 2 objects 2 2  Language- repeat 1 1   Language- follow 3 step command 3 3  Language- read & follow direction 1 1  Write a sentence 1 1  Copy design 1 1  Total score 30 30        01/27/2024    1:13 PM 01/24/2023    8:33 AM 07/12/2020    2:18 PM 07/12/2019   10:26 AM  6CIT Screen  What Year? 0 points 0 points 0 points 0 points  What month? 0 points 0 points 0 points 0 points  What time? 0 points 0 points  0 points  Count back from 20 0 points 0 points  0 points  Months in reverse 0 points 0 points 0 points 0 points  Repeat phrase 0 points 0 points  0 points  Total Score 0 points 0 points  0 points    Immunizations Immunization History  Administered Date(s) Administered   Fluad Quad(high Dose 65+) 11/20/2020, 11/11/2022   Fluad Trivalent(High Dose 65+) 11/17/2023   Influenza, High Dose Seasonal PF 11/20/2016,  09/29/2017, 09/30/2018, 10/07/2019, 10/05/2021   Influenza,inj,Quad PF,6+ Mos 08/26/2014, 08/29/2015   Influenza-Unspecified 09/20/2012, 09/24/2013, 09/22/2021   PFIZER(Purple Top)SARS-COV-2 Vaccination 01/03/2020, 01/24/2020   Pneumococcal Conjugate-13 05/25/2014   Pneumococcal Polysaccharide-23 07/21/2012, 09/30/2018   Td 08/12/2023   Tdap 06/20/2013   Zoster Recombinant(Shingrix) 07/30/2022, 09/23/2022   Zoster, Live 06/20/2013    TDAP status: Up to date  Flu Vaccine status: Up to date  Pneumococcal vaccine status: Up to date  Covid-19 vaccine status: Information provided on how to obtain vaccines.   Qualifies for Shingles Vaccine? Yes   Zostavax completed Yes   Shingrix Completed?: Yes  Screening Tests Health Maintenance  Topic Date Due   COVID-19 Vaccine (3 - Pfizer risk series) 02/21/2020   Medicare Annual Wellness (AWV)  01/25/2024   OPHTHALMOLOGY EXAM  01/21/2024   Diabetic kidney evaluation - Urine ACR  05/13/2024   FOOT EXAM  05/13/2024   HEMOGLOBIN A1C  05/17/2024   Diabetic kidney evaluation - eGFR measurement  11/16/2024   DTaP/Tdap/Td (3 - Td or Tdap) 08/11/2033   Pneumonia  Vaccine 75+ Years old  Completed   INFLUENZA VACCINE  Completed   Zoster Vaccines- Shingrix  Completed   HPV VACCINES  Aged Out   Colonoscopy  Discontinued   Hepatitis C Screening  Discontinued    Health Maintenance  Health Maintenance Due  Topic Date Due   COVID-19 Vaccine (3 - Pfizer risk series) 02/21/2020   Medicare Annual Wellness (AWV)  01/25/2024   OPHTHALMOLOGY EXAM  01/21/2024    Colorectal cancer screening: No longer required.   Lung Cancer Screening: (Low Dose CT Chest recommended if Age 57-80 years, 20 pack-year currently smoking OR have quit w/in 15years.) does not qualify.    Additional Screening:  Hepatitis C Screening: does qualify; Completed 12/2016  Vision Screening: Recommended annual ophthalmology exams for early detection of glaucoma and other disorders of the eye. Is the patient up to date with their annual eye exam?  Yes  Who is the provider or what is the name of the office in which the patient attends annual eye exams? [facility] had eye exam a week ago. Requested records If pt is not established with a provider, would they like to be referred to a provider to establish care? No .   Dental Screening: Recommended annual dental exams for proper oral hygiene  Diabetic Foot Exam: Diabetic Foot Exam: Completed 05/2023  Community Resource Referral / Chronic Care Management: CRR required this visit?  No   CCM required this visit?  No     Plan:     I have personally reviewed and noted the following in the patient's chart:   Medical and social history Use of alcohol, tobacco or illicit drugs  Current medications and supplements including opioid prescriptions. Patient is not currently taking opioid prescriptions. Functional ability and status Nutritional status Physical activity Advanced directives List of other physicians Hospitalizations, surgeries, and ER visits in previous 12 months Vitals Screenings to include cognitive, depression, and  falls Referrals and appointments  In addition, I have reviewed and discussed with patient certain preventive protocols, quality metrics, and best practice recommendations. A written personalized care plan for preventive services as well as general preventive health recommendations were provided to patient.     Sydell Axon, LPN   05/29/3085   After Visit Summary: (MyChart) Due to this being a telephonic visit, the after visit summary with patients personalized plan was offered to patient via MyChart   Nurse Notes: None

## 2024-01-27 NOTE — Patient Instructions (Signed)
 [name] , Thank you for taking time to come for your Medicare Wellness Visit. I appreciate your ongoing commitment to your health goals. Please review the following plan we discussed and let me know if I can assist you in the future.   Referrals/Orders/Follow-Ups/Clinician Recommendations: None  This is a list of the screening recommended for you and due dates:  Health Maintenance  Topic Date Due   COVID-19 Vaccine (3 - Pfizer risk series) 02/21/2020   Eye exam for diabetics  01/21/2024   Yearly kidney health urinalysis for diabetes  05/13/2024   Complete foot exam   05/13/2024   Hemoglobin A1C  05/17/2024   Yearly kidney function blood test for diabetes  11/16/2024   Medicare Annual Wellness Visit  01/26/2025   DTaP/Tdap/Td vaccine (3 - Td or Tdap) 08/11/2033   Pneumonia Vaccine  Completed   Flu Shot  Completed   Zoster (Shingles) Vaccine  Completed   HPV Vaccine  Aged Out   Colon Cancer Screening  Discontinued   Hepatitis C Screening  Discontinued    Advanced directives: (In Chart) A copy of your advanced directives are scanned into your chart should your provider ever need it.  Next Medicare Annual Wellness Visit scheduled for next year: Yes 01/28/25 @ 8:10

## 2024-02-24 DIAGNOSIS — M531 Cervicobrachial syndrome: Secondary | ICD-10-CM | POA: Diagnosis not present

## 2024-02-24 DIAGNOSIS — M9902 Segmental and somatic dysfunction of thoracic region: Secondary | ICD-10-CM | POA: Diagnosis not present

## 2024-02-24 DIAGNOSIS — M9903 Segmental and somatic dysfunction of lumbar region: Secondary | ICD-10-CM | POA: Diagnosis not present

## 2024-02-24 DIAGNOSIS — M9901 Segmental and somatic dysfunction of cervical region: Secondary | ICD-10-CM | POA: Diagnosis not present

## 2024-02-24 DIAGNOSIS — M546 Pain in thoracic spine: Secondary | ICD-10-CM | POA: Diagnosis not present

## 2024-02-24 DIAGNOSIS — M545 Low back pain, unspecified: Secondary | ICD-10-CM | POA: Diagnosis not present

## 2024-03-01 DIAGNOSIS — M25532 Pain in left wrist: Secondary | ICD-10-CM | POA: Diagnosis not present

## 2024-03-01 DIAGNOSIS — M25531 Pain in right wrist: Secondary | ICD-10-CM | POA: Diagnosis not present

## 2024-03-02 ENCOUNTER — Encounter: Payer: Self-pay | Admitting: Internal Medicine

## 2024-03-18 ENCOUNTER — Ambulatory Visit: Payer: Medicare HMO | Admitting: Dermatology

## 2024-03-23 ENCOUNTER — Ambulatory Visit: Admitting: Dermatology

## 2024-03-23 ENCOUNTER — Encounter: Payer: Self-pay | Admitting: Dermatology

## 2024-03-23 DIAGNOSIS — L570 Actinic keratosis: Secondary | ICD-10-CM | POA: Diagnosis not present

## 2024-03-23 DIAGNOSIS — D1721 Benign lipomatous neoplasm of skin and subcutaneous tissue of right arm: Secondary | ICD-10-CM

## 2024-03-23 DIAGNOSIS — L814 Other melanin hyperpigmentation: Secondary | ICD-10-CM | POA: Diagnosis not present

## 2024-03-23 DIAGNOSIS — W908XXA Exposure to other nonionizing radiation, initial encounter: Secondary | ICD-10-CM | POA: Diagnosis not present

## 2024-03-23 DIAGNOSIS — D692 Other nonthrombocytopenic purpura: Secondary | ICD-10-CM | POA: Diagnosis not present

## 2024-03-23 DIAGNOSIS — Z85828 Personal history of other malignant neoplasm of skin: Secondary | ICD-10-CM

## 2024-03-23 DIAGNOSIS — D1801 Hemangioma of skin and subcutaneous tissue: Secondary | ICD-10-CM

## 2024-03-23 DIAGNOSIS — L821 Other seborrheic keratosis: Secondary | ICD-10-CM

## 2024-03-23 DIAGNOSIS — L719 Rosacea, unspecified: Secondary | ICD-10-CM

## 2024-03-23 DIAGNOSIS — L820 Inflamed seborrheic keratosis: Secondary | ICD-10-CM | POA: Diagnosis not present

## 2024-03-23 DIAGNOSIS — L711 Rhinophyma: Secondary | ICD-10-CM | POA: Diagnosis not present

## 2024-03-23 DIAGNOSIS — L578 Other skin changes due to chronic exposure to nonionizing radiation: Secondary | ICD-10-CM

## 2024-03-23 DIAGNOSIS — D229 Melanocytic nevi, unspecified: Secondary | ICD-10-CM

## 2024-03-23 DIAGNOSIS — Z1283 Encounter for screening for malignant neoplasm of skin: Secondary | ICD-10-CM

## 2024-03-23 NOTE — Progress Notes (Signed)
 Follow-Up Visit   Subjective  Richard Hardy is a 78 y.o. male who presents for the following: Skin Cancer Screening and Full Body Skin Exam Crusted red spot at left shoulder has been there for a few months and a spot at left cheek he noticed. Hx of bcc, hx of aks , hx of isks  The patient presents for Total-Body Skin Exam (TBSE) for skin cancer screening and mole check. The patient has spots, moles and lesions to be evaluated, some may be new or changing and the patient may have concern these could be cancer.  The following portions of the chart were reviewed this encounter and updated as appropriate: medications, allergies, medical history  Review of Systems:  No other skin or systemic complaints except as noted in HPI or Assessment and Plan.  Objective  Well appearing patient in no apparent distress; mood and affect are within normal limits.  A full examination was performed including scalp, head, eyes, ears, nose, lips, neck, chest, axillae, abdomen, back, buttocks, bilateral upper extremities, bilateral lower extremities, hands, feet, fingers, toes, fingernails, and toenails. All findings within normal limits unless otherwise noted below.   Relevant physical exam findings are noted in the Assessment and Plan.  face and ears x10 (10) Erythematous thin papules/macules with gritty scale.  b/l arms x 16 (16) Erythematous stuck-on, waxy papule or plaque  Assessment & Plan   SKIN CANCER SCREENING PERFORMED TODAY.  ACTINIC DAMAGE - Chronic condition, secondary to cumulative UV/sun exposure - diffuse scaly erythematous macules with underlying dyspigmentation - Recommend daily broad spectrum sunscreen SPF 30+ to sun-exposed areas, reapply every 2 hours as needed.  - Staying in the shade or wearing long sleeves, sun glasses (UVA+UVB protection) and wide brim hats (4-inch brim around the entire circumference of the hat) are also recommended for sun protection.  - Call for new or  changing lesions.  LENTIGINES, SEBORRHEIC KERATOSES, HEMANGIOMAS - Benign normal skin lesions - Benign-appearing - Call for any changes  MELANOCYTIC NEVI - Tan-brown and/or pink-flesh-colored symmetric macules and papules - Benign appearing on exam today - Observation - Call clinic for new or changing moles - Recommend daily use of broad spectrum spf 30+ sunscreen to sun-exposed areas.   Lipoma of right upper extremity Right posterior deltoid Exam : rubbery nodule Benign-appearing.  Observation.  Call clinic for new or changing lesions.  Recommend daily use of broad spectrum spf 30+ sunscreen to sun-exposed areas.   Purpura - Chronic; persistent and recurrent.  Treatable, but not curable. - Violaceous macules and patches - Benign - Related to trauma, age, sun damage and/or use of blood thinners, chronic use of topical and/or oral steroids - Observe - Can use OTC arnica containing moisturizer such as Dermend Bruise Formula if desired - Call for worsening or other concerns   ROSACEA WITH RHINOPHYMA Exam Face with erythema and telangiectasias thickening of the skin of the nose Rosacea is a chronic progressive skin condition usually affecting the face of adults, causing redness and/or acne bumps. It is treatable but not curable. It sometimes affects the eyes (ocular rosacea) as well. It may respond to topical and/or systemic medication and can flare with stress, sun exposure, alcohol, exercise, topical steroids (including hydrocortisone/cortisone 10) and some foods.  Daily application of broad spectrum spf 30+ sunscreen to face is recommended to reduce flares. Chronic condition with duration or expected duration over one year. Currently well-controlled. Treatment Plan Benign, observe Discussed treatment options of laser topical and oral medication and surgical  treatment of rhinophyma.  Patient declines at this time.Richard Hardy      HISTORY OF BASAL CELL CARCINOMA OF THE SKIN 02/02/2020 left  proximal medial calf - ED&C  - No evidence of recurrence today - Recommend regular full body skin exams - Recommend daily broad spectrum sunscreen SPF 30+ to sun-exposed areas, reapply every 2 hours as needed.  - Call if any new or changing lesions are noted between office visits  ACTINIC KERATOSIS (10) face and ears x10 (10) If in 6 weeks areas treated are not resolved patient advised to call  Recheck Right temple at next follow up  Actinic keratoses are precancerous spots that appear secondary to cumulative UV radiation exposure/sun exposure over time. They are chronic with expected duration over 1 year. A portion of actinic keratoses will progress to squamous cell carcinoma of the skin. It is not possible to reliably predict which spots will progress to skin cancer and so treatment is recommended to prevent development of skin cancer.  Recommend daily broad spectrum sunscreen SPF 30+ to sun-exposed areas, reapply every 2 hours as needed.  Recommend staying in the shade or wearing long sleeves, sun glasses (UVA+UVB protection) and wide brim hats (4-inch brim around the entire circumference of the hat). Call for new or changing lesions. Destruction of lesion - face and ears x10 (10) Complexity: simple   Destruction method: cryotherapy   Informed consent: discussed and consent obtained   Timeout:  patient name, date of birth, surgical site, and procedure verified Lesion destroyed using liquid nitrogen: Yes   Region frozen until ice ball extended beyond lesion: Yes   Outcome: patient tolerated procedure well with no complications   Post-procedure details: wound care instructions given   INFLAMED SEBORRHEIC KERATOSIS (16) b/l arms x 16 (16) Symptomatic, irritating, patient would like treated. Destruction of lesion - b/l arms x 16 (16) Complexity: simple   Destruction method: cryotherapy   Informed consent: discussed and consent obtained   Timeout:  patient name, date of birth, surgical  site, and procedure verified Lesion destroyed using liquid nitrogen: Yes   Region frozen until ice ball extended beyond lesion: Yes   Outcome: patient tolerated procedure well with no complications   Post-procedure details: wound care instructions given   Return in about 1 year (around 03/23/2025) for TBSE.  IRandee Busing, CMA, am acting as scribe for Celine Collard, MD.   Documentation: I have reviewed the above documentation for accuracy and completeness, and I agree with the above.  Celine Collard, MD

## 2024-03-23 NOTE — Patient Instructions (Addendum)
 If in 6 weeks areas treated are not resolved call or send mychart will need to bring you back in.   Actinic keratoses are precancerous spots that appear secondary to cumulative UV radiation exposure/sun exposure over time. They are chronic with expected duration over 1 year. A portion of actinic keratoses will progress to squamous cell carcinoma of the skin. It is not possible to reliably predict which spots will progress to skin cancer and so treatment is recommended to prevent development of skin cancer.  Recommend daily broad spectrum sunscreen SPF 30+ to sun-exposed areas, reapply every 2 hours as needed.  Recommend staying in the shade or wearing long sleeves, sun glasses (UVA+UVB protection) and wide brim hats (4-inch brim around the entire circumference of the hat). Call for new or changing lesions.   Cryotherapy Aftercare  Wash gently with soap and water everyday.   Apply Vaseline and Band-Aid daily until healed.   Seborrheic Keratosis  What causes seborrheic keratoses? Seborrheic keratoses are harmless, common skin growths that first appear during adult life.  As time goes by, more growths appear.  Some people may develop a large number of them.  Seborrheic keratoses appear on both covered and uncovered body parts.  They are not caused by sunlight.  The tendency to develop seborrheic keratoses can be inherited.  They vary in color from skin-colored to gray, brown, or even black.  They can be either smooth or have a rough, warty surface.   Seborrheic keratoses are superficial and look as if they were stuck on the skin.  Under the microscope this type of keratosis looks like layers upon layers of skin.  That is why at times the top layer may seem to fall off, but the rest of the growth remains and re-grows.    Treatment Seborrheic keratoses do not need to be treated, but can easily be removed in the office.  Seborrheic keratoses often cause symptoms when they rub on clothing or jewelry.   Lesions can be in the way of shaving.  If they become inflamed, they can cause itching, soreness, or burning.  Removal of a seborrheic keratosis can be accomplished by freezing, burning, or surgery. If any spot bleeds, scabs, or grows rapidly, please return to have it checked, as these can be an indication of a skin cancer.    Melanoma ABCDEs  Melanoma is the most dangerous type of skin cancer, and is the leading cause of death from skin disease.  You are more likely to develop melanoma if you: Have light-colored skin, light-colored eyes, or red or blond hair Spend a lot of time in the sun Tan regularly, either outdoors or in a tanning bed Have had blistering sunburns, especially during childhood Have a close family member who has had a melanoma Have atypical moles or large birthmarks  Early detection of melanoma is key since treatment is typically straightforward and cure rates are extremely high if we catch it early.   The first sign of melanoma is often a change in a mole or a new dark spot.  The ABCDE system is a way of remembering the signs of melanoma.  A for asymmetry:  The two halves do not match. B for border:  The edges of the growth are irregular. C for color:  A mixture of colors are present instead of an even brown color. D for diameter:  Melanomas are usually (but not always) greater than 6mm - the size of a pencil eraser. E for evolution:  The  spot keeps changing in size, shape, and color.  Please check your skin once per month between visits. You can use a small mirror in front and a large mirror behind you to keep an eye on the back side or your body.   If you see any new or changing lesions before your next follow-up, please call to schedule a visit.  Please continue daily skin protection including broad spectrum sunscreen SPF 30+ to sun-exposed areas, reapplying every 2 hours as needed when you're outdoors.   Staying in the shade or wearing long sleeves, sun glasses  (UVA+UVB protection) and wide brim hats (4-inch brim around the entire circumference of the hat) are also recommended for sun protection.    Due to recent changes in healthcare laws, you may see results of your pathology and/or laboratory studies on MyChart before the doctors have had a chance to review them. We understand that in some cases there may be results that are confusing or concerning to you. Please understand that not all results are received at the same time and often the doctors may need to interpret multiple results in order to provide you with the best plan of care or course of treatment. Therefore, we ask that you please give [place] 2 business days to thoroughly review all your results before contacting the office for clarification. Should we see a critical lab result, you will be contacted sooner.   If You Need Anything After Your Visit  If you have any questions or concerns for your doctor, please call our main line at [phone] and press option 4 to reach your doctor's medical assistant. If no one answers, please leave a voicemail as directed and we will return your call as soon as possible. Messages left after 4 pm will be answered the following business day.   You may also send [place] a message via MyChart. We typically respond to MyChart messages within 1-2 business days.  For prescription refills, please ask your pharmacy to contact our office. Our fax number is [phone].  If you have an urgent issue when the clinic is closed that cannot wait until the next business day, you can page your doctor at the number below.    Please note that while we do our best to be available for urgent issues outside of office hours, we are not available 24/7.   If you have an urgent issue and are unable to reach [place], you may choose to seek medical care at your doctor's office, retail clinic, urgent care center, or emergency room.  If you have a medical emergency, please immediately call 911 or  go to the emergency department.  Pager Numbers  - Dr. Gwen Pounds: [phone]  - Dr. Roseanne Reno: [phone]  - Dr. Katrinka Blazing: [phone]   In the event of inclement weather, please call our main line at [phone] for an update on the status of any delays or closures.  Dermatology Medication Tips: Please keep the boxes that topical medications come in in order to help keep track of the instructions about where and how to use these. Pharmacies typically print the medication instructions only on the boxes and not directly on the medication tubes.   If your medication is too expensive, please contact our office at [phone] option 4 or send [place] a message through MyChart.   We are unable to tell what your co-pay for medications will be in advance as this is different depending on your insurance coverage. However, we may be able to find  a substitute medication at lower cost or fill out paperwork to get insurance to cover a needed medication.   If a prior authorization is required to get your medication covered by your insurance company, please allow [place] 1-2 business days to complete this process.  Drug prices often vary depending on where the prescription is filled and some pharmacies may offer cheaper prices.  The website www.goodrx.com contains coupons for medications through different pharmacies. The prices here do not account for what the cost may be with help from insurance (it may be cheaper with your insurance), but the website can give you the price if you did not use any insurance.  - You can print the associated coupon and take it with your prescription to the pharmacy.  - You may also stop by our office during regular business hours and pick up a GoodRx coupon card.  - If you need your prescription sent electronically to a different pharmacy, notify our office through [facility] or by phone at [phone] option 4.     Si Usted Necesita Algo Despus de Su  Visita  Tambin puede enviarnos un mensaje a travs de Clinical cytogeneticist. Por lo general respondemos a los mensajes de MyChart en el transcurso de 1 a 2 das hbiles.  Para renovar recetas, por favor pida a su farmacia que se ponga en contacto con nuestra oficina. Annie Sable de fax es [place] [phone].  Si tiene un asunto urgente cuando la clnica est cerrada y que no puede esperar hasta el siguiente da hbil, puede llamar/localizar a su doctor(a) al nmero que aparece a continuacin.   Por favor, tenga en cuenta que aunque hacemos todo lo posible para estar disponibles para asuntos urgentes fuera del horario de [place], no estamos disponibles las 24 horas del da, los 7 [address]  [address] de la [place].   Si tiene un problema urgente y no puede comunicarse con nosotros, puede optar por buscar atencin mdica  en el consultorio de su doctor(a), en una clnica privada, en un centro de atencin urgente o en una sala de emergencias.  Si tiene Engineer, drilling, por favor llame inmediatamente al 911 o vaya a la sala de emergencias.  Nmeros de bper  - Dr. Gwen Pounds: [phone]  - Dra. Roseanne Reno: [phone]  - Dr. Katrinka Blazing: [phone]   En caso de inclemencias del tiempo, por favor llame a Lacy Duverney principal al [phone] para una actualizacin sobre el [place] de cualquier retraso o cierre.  Consejos para la medicacin en dermatologa: Por favor, guarde las cajas en las que vienen los medicamentos de uso tpico para ayudarle a seguir las instrucciones sobre dnde y cmo usarlos. Las farmacias generalmente imprimen las instrucciones del medicamento slo en las cajas y no directamente en los tubos del [place].   Si su medicamento es muy caro, por favor, pngase en contacto con Rolm Gala llamando al [phone] y presione la opcin 4 o envenos un mensaje a travs de Clinical cytogeneticist.   No podemos decirle cul ser su copago por los medicamentos por adelantado ya que esto es diferente dependiendo de  la cobertura de su seguro. Sin embargo, es posible que podamos encontrar un medicamento sustituto a Audiological scientist un formulario para que el seguro cubra el medicamento que se considera necesario.   Si se requiere una autorizacin previa para que su compaa de seguros [place] su medicamento, por favor permtanos de 1 a 2 das hbiles para completar [address]  Los precios de los medicamentos varan con frecuencia dependiendo  del lugar de dnde se surte la receta y alguna farmacias pueden ofrecer precios ms baratos.  El sitio web www.goodrx.com tiene cupones para medicamentos de Health and safety inspector. Los precios aqu no tienen en cuenta lo que podra costar con la ayuda del seguro (puede ser ms barato con su seguro), pero el sitio web puede darle el precio si no utiliz Tourist information centre manager.  - Puede imprimir el cupn correspondiente y llevarlo con su receta a la farmacia.  - Tambin puede pasar por nuestra oficina durante el horario de atencin regular y Education officer, museum una tarjeta de cupones de GoodRx.  - Si necesita que su receta se enve electrnicamente a una farmacia diferente, informe a nuestra oficina a travs de MyChart de  o por telfono llamando al [phone] y presione la opcin 4.

## 2024-03-30 DIAGNOSIS — M531 Cervicobrachial syndrome: Secondary | ICD-10-CM | POA: Diagnosis not present

## 2024-03-30 DIAGNOSIS — M9902 Segmental and somatic dysfunction of thoracic region: Secondary | ICD-10-CM | POA: Diagnosis not present

## 2024-03-30 DIAGNOSIS — M9903 Segmental and somatic dysfunction of lumbar region: Secondary | ICD-10-CM | POA: Diagnosis not present

## 2024-03-30 DIAGNOSIS — M9901 Segmental and somatic dysfunction of cervical region: Secondary | ICD-10-CM | POA: Diagnosis not present

## 2024-03-30 DIAGNOSIS — M546 Pain in thoracic spine: Secondary | ICD-10-CM | POA: Diagnosis not present

## 2024-03-30 DIAGNOSIS — M545 Low back pain, unspecified: Secondary | ICD-10-CM | POA: Diagnosis not present

## 2024-03-31 ENCOUNTER — Other Ambulatory Visit: Payer: Self-pay | Admitting: Internal Medicine

## 2024-05-04 DIAGNOSIS — M531 Cervicobrachial syndrome: Secondary | ICD-10-CM | POA: Diagnosis not present

## 2024-05-04 DIAGNOSIS — M545 Low back pain, unspecified: Secondary | ICD-10-CM | POA: Diagnosis not present

## 2024-05-04 DIAGNOSIS — M546 Pain in thoracic spine: Secondary | ICD-10-CM | POA: Diagnosis not present

## 2024-05-04 DIAGNOSIS — M9903 Segmental and somatic dysfunction of lumbar region: Secondary | ICD-10-CM | POA: Diagnosis not present

## 2024-05-04 DIAGNOSIS — M9901 Segmental and somatic dysfunction of cervical region: Secondary | ICD-10-CM | POA: Diagnosis not present

## 2024-05-04 DIAGNOSIS — M9902 Segmental and somatic dysfunction of thoracic region: Secondary | ICD-10-CM | POA: Diagnosis not present

## 2024-05-05 DIAGNOSIS — R799 Abnormal finding of blood chemistry, unspecified: Secondary | ICD-10-CM | POA: Diagnosis not present

## 2024-05-17 ENCOUNTER — Ambulatory Visit: Payer: Medicare HMO | Admitting: Internal Medicine

## 2024-06-02 ENCOUNTER — Encounter: Payer: Self-pay | Admitting: Internal Medicine

## 2024-06-02 ENCOUNTER — Ambulatory Visit: Admitting: Internal Medicine

## 2024-06-02 VITALS — BP 130/72 | HR 50 | Ht 71.0 in | Wt 207.4 lb

## 2024-06-02 DIAGNOSIS — R1013 Epigastric pain: Secondary | ICD-10-CM | POA: Diagnosis not present

## 2024-06-02 DIAGNOSIS — E1169 Type 2 diabetes mellitus with other specified complication: Secondary | ICD-10-CM

## 2024-06-02 DIAGNOSIS — D7589 Other specified diseases of blood and blood-forming organs: Secondary | ICD-10-CM | POA: Diagnosis not present

## 2024-06-02 DIAGNOSIS — M5126 Other intervertebral disc displacement, lumbar region: Secondary | ICD-10-CM

## 2024-06-02 DIAGNOSIS — Z7984 Long term (current) use of oral hypoglycemic drugs: Secondary | ICD-10-CM

## 2024-06-02 DIAGNOSIS — R7401 Elevation of levels of liver transaminase levels: Secondary | ICD-10-CM

## 2024-06-02 DIAGNOSIS — I700 Atherosclerosis of aorta: Secondary | ICD-10-CM | POA: Diagnosis not present

## 2024-06-02 DIAGNOSIS — E785 Hyperlipidemia, unspecified: Secondary | ICD-10-CM

## 2024-06-02 DIAGNOSIS — E119 Type 2 diabetes mellitus without complications: Secondary | ICD-10-CM

## 2024-06-02 LAB — MICROALBUMIN / CREATININE URINE RATIO
Creatinine,U: 188.1 mg/dL
Microalb Creat Ratio: 12.1 mg/g (ref 0.0–30.0)
Microalb, Ur: 2.3 mg/dL — ABNORMAL HIGH (ref 0.0–1.9)

## 2024-06-02 LAB — COMPREHENSIVE METABOLIC PANEL WITH GFR
ALT: 34 U/L (ref 0–53)
AST: 26 U/L (ref 0–37)
Albumin: 4.3 g/dL (ref 3.5–5.2)
Alkaline Phosphatase: 49 U/L (ref 39–117)
BUN: 25 mg/dL — ABNORMAL HIGH (ref 6–23)
CO2: 29 meq/L (ref 19–32)
Calcium: 9.2 mg/dL (ref 8.4–10.5)
Chloride: 104 meq/L (ref 96–112)
Creatinine, Ser: 1.18 mg/dL (ref 0.40–1.50)
GFR: 58.34 mL/min — ABNORMAL LOW (ref >60.00)
Glucose, Bld: 115 mg/dL — ABNORMAL HIGH (ref 70–99)
Potassium: 4.2 meq/L (ref 3.5–5.1)
Sodium: 141 meq/L (ref 135–145)
Total Bilirubin: 0.9 mg/dL (ref 0.2–1.2)
Total Protein: 6.4 g/dL (ref 6.0–8.3)

## 2024-06-02 LAB — LIPID PANEL
Cholesterol: 98 mg/dL (ref 0–200)
HDL: 40.3 mg/dL (ref >39.00)
LDL Cholesterol: 48 mg/dL (ref 0–99)
NonHDL: 57.44
Total CHOL/HDL Ratio: 2
Triglycerides: 46 mg/dL (ref 0.0–149.0)
VLDL: 9.2 mg/dL (ref 0.0–40.0)

## 2024-06-02 LAB — HEMOGLOBIN A1C: Hgb A1c MFr Bld: 6.9 % — ABNORMAL HIGH (ref 4.6–6.5)

## 2024-06-02 LAB — LIPASE: Lipase: 25 U/L (ref 11.0–59.0)

## 2024-06-02 LAB — LDL CHOLESTEROL, DIRECT: Direct LDL: 51 mg/dL

## 2024-06-02 MED ORDER — EMPAGLIFLOZIN 10 MG PO TABS: 10.0000 mg | ORAL_TABLET | Freq: Every day | ORAL | 2 refills | Status: DC

## 2024-06-02 MED ORDER — GLIPIZIDE 10 MG PO TABS: ORAL_TABLET | ORAL | 1 refills | Status: DC

## 2024-06-02 NOTE — Progress Notes (Signed)
 Subjective:  Patient ID: Richard Hardy, male    DOB: [birth year]  Age: 78 y.o. MRN: [id number]  CC: The primary encounter diagnosis was Hyperlipidemia due to type 2 diabetes mellitus (HCC). 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   HPI Richard Hardy presents for  Chief Complaint  Patient presents with   Medical Management of Chronic Issues    6 month follow up    1) Type 2 DM:  stopped metformin  one month ago may 20 due to abdominal pain (recurrence of similar symptoms attributed to metformin  in the past )  symptoms were diffuse abdominal pain that took 2-3 weeks to resolve .  No residual pai n,  no nausea.  His Fasting sugars have been < 140  and 5 pm (predinners) are < 160.  He has tolerated onglyza in the past,  but the cost was too high.  Taking glipizide  10 mg bid.   2) Hypertension: patient checks blood pressure twice weekly at home.  Readings have been for the most part <130/80 at rest . Patient is following a reduced salt diet most days and is taking medications as prescribed    Outpatient Medications Prior to Visit  Medication Sig Dispense Refill   atorvastatin  (LIPITOR) 20 MG tablet TAKE 1 TABLET BY MOUTH EVERY DAY 90 tablet 3   Blood Glucose Monitoring Suppl (ONETOUCH VERIO IQ SYSTEM) w/Device KIT Use to check blood sugars once daily. 1 kit 0   Coenzyme Q10 (COQ10) 200 MG CAPS Take 1 tablet by mouth daily.     Fish Oil OIL Take by mouth daily.     fluticasone (FLONASE) 50 MCG/ACT nasal spray Place into the nose at bedtime.     glucosamine-chondroitin 500-400 MG tablet Take 1 tablet by mouth in the morning and at bedtime.     glucose blood (ONETOUCH VERIO) test strip USE TO CHECK SUGARS ONCE OR TWICE DAILY . 100 strip 1    hydrochlorothiazide  (HYDRODIURIL ) 25 MG tablet TAKE 1 TABLET (25 MG TOTAL) BY MOUTH DAILY. 90 tablet 3   Multiple Vitamin (MULTIVITAMIN) capsule Take 1 capsule by mouth daily.     ONETOUCH DELICA LANCETS 33G MISC Use to check blood sugars once daily. 100 each 1   Turmeric 500 MG TABS Take 1 tablet by mouth daily.     vitamin C (ASCORBIC ACID) 500 MG tablet Take 500 mg by mouth daily.     furosemide  (LASIX ) 20 MG tablet Take 1 tablet (20 mg total) by mouth 3 (three) times a week. 36 tablet 3   glipiZIDE  (GLUCOTROL ) 10 MG tablet TAKE 1 TABLET (10 MG TOTAL) BY MOUTH TWICE A DAY BEFORE A MEAL 180 tablet 0   metFORMIN  (GLUCOPHAGE -XR) 500 MG 24 hr tablet TAKE 1 TABLET BY MOUTH EVERY DAY WITH BREAKFAST (Patient not taking: Reported on 06/02/2024) 90 tablet 1   No facility-administered medications prior to visit.    Review of Systems;  Patient denies headache, fevers, malaise, unintentional weight loss, skin rash, eye pain, sinus congestion and sinus pain, sore throat, dysphagia,  hemoptysis , cough, dyspnea, wheezing, chest pain, palpitations, orthopnea, edema, abdominal pain, nausea, melena, diarrhea, constipation, flank pain, dysuria, hematuria, urinary  Frequency, nocturia, numbness, tingling, seizures,  Focal weakness, Loss of consciousness,  Tremor, insomnia, depression, anxiety, and suicidal ideation.  Objective:  BP 130/72   Pulse (!) 50   Ht 5' 11 (1.803 m)   Wt 207 lb 6.4 oz (94.1 kg)   SpO2 98%   BMI 28.93 kg/m   BP Readings from Last 3 Encounters:  06/02/24 130/72  12/15/23 (!) 144/82  11/17/23 126/76    Wt Readings from Last 3 Encounters:  06/02/24 207 lb 6.4 oz (94.1 kg)  01/27/24 211 lb (95.7 kg)  12/15/23 219 lb (99.3 kg)    Physical Exam Vitals reviewed.  Constitutional:      General: He is not in acute distress.    Appearance: Normal appearance. He is normal weight. He is not ill-appearing, toxic-appearing or diaphoretic.  HENT:     Head: Normocephalic.    Eyes:     General: No scleral icterus.       Right eye: No discharge.        Left eye: No discharge.     Conjunctiva/sclera: Conjunctivae normal.    Cardiovascular:     Rate and Rhythm: Normal rate and regular rhythm.     Heart sounds: Normal heart sounds.  Pulmonary:     Effort: Pulmonary effort is normal. No respiratory distress.     Breath sounds: Normal breath sounds.   Musculoskeletal:        General: Normal range of motion.     Cervical back: Normal range of motion.   Skin:    General: Skin is warm and dry.   Neurological:     General: No focal deficit present.     Mental Status: He is alert and oriented to person, place, and time. Mental status is at baseline.   Psychiatric:        Mood and Affect: Mood normal.        Behavior: Behavior normal.        Thought Content: Thought content normal.        Judgment: Judgment normal.    Lab Results  Component Value Date   HGBA1C 6.9 (H) 06/02/2024   HGBA1C 7.5 (H) 11/17/2023   HGBA1C 7.3 (H) 05/14/2023    Lab Results  Component Value Date   CREATININE 1.18 06/02/2024   CREATININE 1.04 11/17/2023   CREATININE 0.94 05/14/2023    Lab Results  Component Value Date   WBC 5.0 11/17/2023   HGB 15.9 11/17/2023   HCT 48.0 11/17/2023   PLT 159.0 11/17/2023   GLUCOSE 115 (H) 06/02/2024   CHOL 98 06/02/2024   TRIG 46.0 06/02/2024   HDL 40.30 06/02/2024   LDLDIRECT 51.0 06/02/2024   LDLCALC 48 06/02/2024   ALT 34 06/02/2024   AST 26 06/02/2024   NA 141 06/02/2024   K 4.2 06/02/2024   CL 104 06/02/2024   CREATININE 1.18 06/02/2024   BUN 25 (H) 06/02/2024   CO2 29 06/02/2024   TSH 2.81 11/17/2023   PSA 0.32 11/10/2019   HGBA1C 6.9 (H) 06/02/2024   MICROALBUR 2.3 (H) 06/02/2024    No results found.  Assessment & Plan:  .Hyperlipidemia due to type 2 diabetes mellitus [facility]) Assessment & Plan: Tolerating atorvastatin ; LDL < 70.  LFTs normal  Lab Results  Component Value Date   CHOL 98 06/02/2024   HDL  40.30 06/02/2024   LDLCALC 48 06/02/2024   LDLDIRECT 51.0 06/02/2024   TRIG 46.0 06/02/2024   CHOLHDL 2 06/02/2024     Orders: -     Lipid panel -     LDL cholesterol, direct  Controlled type 2 diabetes mellitus  without complication, without long-term current use of insulin  (HCC) Assessment & Plan: Loss of control  was addressed with an increase in glipizide  to 10 mg bid .  He stopped metformin  due to a prolonged period of abdominal pain (2-3 weeks) that occurred one month ago..  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 Preior imaging studies suggestive of hepatic steatosis .  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  Dispense: 30 tablet; Refill: 2     I spent 34 minutes on the day of this face to face  encounter reviewing patient's  most recent visit with cardiology,  nephrology,  and neurology,  prior relevant surgical and non surgical procedures, recent  labs and imaging studies, counseling on weight management,  reviewing the assessment and plan with patient, and post visit ordering and reviewing of  diagnostics and therapeutics with patient  .   Follow-up: Return in about 6 months (around 12/02/2024).   Verneita LITTIE Kettering, MD

## 2024-06-02 NOTE — Assessment & Plan Note (Addendum)
 HE IS RECEIVING CHIROPRACTIC manipulation by Lorrene Lopes  every 5-6 weeks which is preventing an exacerbation

## 2024-06-02 NOTE — Assessment & Plan Note (Signed)
 Resolved post recovery from COVID  Lab Results  Component Value Date   WBC 5.0 11/17/2023   HGB 15.9 11/17/2023   HCT 48.0 11/17/2023   MCV 98.2 11/17/2023   PLT 159.0 11/17/2023

## 2024-06-02 NOTE — Assessment & Plan Note (Addendum)
 Loss of control  was addressed with an increase in glipizide  to 10 mg bid .  He stopped metformin  due to a prolonged period of abdominal pain (2-3 weeks) that occurred one month ago..  will add Jardiance for  for A1c > 6.5  Lab Results  Component Value Date   HGBA1C 6.9 (H) 06/02/2024

## 2024-06-02 NOTE — Assessment & Plan Note (Signed)
 Noted during treatment with Paxlovid . Preior imaging studies suggestive of hepatic steatosis .  Repeat liver enzymes have normalized Lab Results  Component Value Date   ALT 34 06/02/2024   AST 26 06/02/2024   ALKPHOS 49 06/02/2024   BILITOT 0.9 06/02/2024

## 2024-06-02 NOTE — Patient Instructions (Signed)
 I agree with stopping metformin  FOR GOOD  IF YOUR A1C IS > 6.5 TODAY,  I WILL RECOMMEND A TRIAL OF JARDIANCE TO ADD TO YOUR REGIMEN OF GLIPIZIDE 

## 2024-06-02 NOTE — Assessment & Plan Note (Signed)
 Tolerating atorvastatin ; LDL < 70.  LFTs normal  Lab Results  Component Value Date   CHOL 98 06/02/2024   HDL 40.30 06/02/2024   LDLCALC 48 06/02/2024   LDLDIRECT 51.0 06/02/2024   TRIG 46.0 06/02/2024   CHOLHDL 2 06/02/2024

## 2024-06-02 NOTE — Assessment & Plan Note (Signed)
He has resumed statin , which was stopped (presumedly bc of liver enzyme elevation )

## 2024-06-04 ENCOUNTER — Ambulatory Visit: Payer: Self-pay | Admitting: Internal Medicine

## 2024-06-10 ENCOUNTER — Other Ambulatory Visit: Payer: Self-pay | Admitting: Internal Medicine

## 2024-06-15 DIAGNOSIS — M9903 Segmental and somatic dysfunction of lumbar region: Secondary | ICD-10-CM | POA: Diagnosis not present

## 2024-06-15 DIAGNOSIS — M545 Low back pain, unspecified: Secondary | ICD-10-CM | POA: Diagnosis not present

## 2024-06-15 DIAGNOSIS — M9901 Segmental and somatic dysfunction of cervical region: Secondary | ICD-10-CM | POA: Diagnosis not present

## 2024-06-15 DIAGNOSIS — M546 Pain in thoracic spine: Secondary | ICD-10-CM | POA: Diagnosis not present

## 2024-06-15 DIAGNOSIS — M9902 Segmental and somatic dysfunction of thoracic region: Secondary | ICD-10-CM | POA: Diagnosis not present

## 2024-06-15 DIAGNOSIS — M531 Cervicobrachial syndrome: Secondary | ICD-10-CM | POA: Diagnosis not present

## 2024-07-01 ENCOUNTER — Encounter: Payer: Self-pay | Admitting: Internal Medicine

## 2024-07-01 MED ORDER — EMPAGLIFLOZIN 10 MG PO TABS: 10.0000 mg | ORAL_TABLET | Freq: Every day | ORAL | 1 refills | Status: DC

## 2024-07-01 NOTE — Telephone Encounter (Signed)
 Called the pharmacy and they will have the RX ready tomorrow so pt is not out of medication. The wife wants to know if we can send a 90 day supply in because is it more cost effective.

## 2024-07-20 DIAGNOSIS — M9902 Segmental and somatic dysfunction of thoracic region: Secondary | ICD-10-CM | POA: Diagnosis not present

## 2024-07-20 DIAGNOSIS — M545 Low back pain, unspecified: Secondary | ICD-10-CM | POA: Diagnosis not present

## 2024-07-20 DIAGNOSIS — M9903 Segmental and somatic dysfunction of lumbar region: Secondary | ICD-10-CM | POA: Diagnosis not present

## 2024-07-20 DIAGNOSIS — M9901 Segmental and somatic dysfunction of cervical region: Secondary | ICD-10-CM | POA: Diagnosis not present

## 2024-07-20 DIAGNOSIS — M546 Pain in thoracic spine: Secondary | ICD-10-CM | POA: Diagnosis not present

## 2024-07-20 DIAGNOSIS — M531 Cervicobrachial syndrome: Secondary | ICD-10-CM | POA: Diagnosis not present

## 2024-08-09 DIAGNOSIS — Z79899 Other long term (current) drug therapy: Secondary | ICD-10-CM | POA: Diagnosis not present

## 2024-08-18 ENCOUNTER — Other Ambulatory Visit: Payer: Self-pay | Admitting: Internal Medicine

## 2024-08-24 DIAGNOSIS — M545 Low back pain, unspecified: Secondary | ICD-10-CM | POA: Diagnosis not present

## 2024-08-24 DIAGNOSIS — M531 Cervicobrachial syndrome: Secondary | ICD-10-CM | POA: Diagnosis not present

## 2024-08-24 DIAGNOSIS — M9903 Segmental and somatic dysfunction of lumbar region: Secondary | ICD-10-CM | POA: Diagnosis not present

## 2024-08-24 DIAGNOSIS — M9902 Segmental and somatic dysfunction of thoracic region: Secondary | ICD-10-CM | POA: Diagnosis not present

## 2024-08-24 DIAGNOSIS — M546 Pain in thoracic spine: Secondary | ICD-10-CM | POA: Diagnosis not present

## 2024-08-24 DIAGNOSIS — M9901 Segmental and somatic dysfunction of cervical region: Secondary | ICD-10-CM | POA: Diagnosis not present

## 2024-09-21 DIAGNOSIS — M9901 Segmental and somatic dysfunction of cervical region: Secondary | ICD-10-CM | POA: Diagnosis not present

## 2024-09-21 DIAGNOSIS — M546 Pain in thoracic spine: Secondary | ICD-10-CM | POA: Diagnosis not present

## 2024-09-21 DIAGNOSIS — M531 Cervicobrachial syndrome: Secondary | ICD-10-CM | POA: Diagnosis not present

## 2024-09-21 DIAGNOSIS — M9902 Segmental and somatic dysfunction of thoracic region: Secondary | ICD-10-CM | POA: Diagnosis not present

## 2024-09-21 DIAGNOSIS — M9903 Segmental and somatic dysfunction of lumbar region: Secondary | ICD-10-CM | POA: Diagnosis not present

## 2024-09-21 DIAGNOSIS — M545 Low back pain, unspecified: Secondary | ICD-10-CM | POA: Diagnosis not present

## 2024-10-26 DIAGNOSIS — M545 Low back pain, unspecified: Secondary | ICD-10-CM | POA: Diagnosis not present

## 2024-10-26 DIAGNOSIS — M531 Cervicobrachial syndrome: Secondary | ICD-10-CM | POA: Diagnosis not present

## 2024-10-26 DIAGNOSIS — M9901 Segmental and somatic dysfunction of cervical region: Secondary | ICD-10-CM | POA: Diagnosis not present

## 2024-10-26 DIAGNOSIS — M9903 Segmental and somatic dysfunction of lumbar region: Secondary | ICD-10-CM | POA: Diagnosis not present

## 2024-10-26 DIAGNOSIS — M546 Pain in thoracic spine: Secondary | ICD-10-CM | POA: Diagnosis not present

## 2024-10-26 DIAGNOSIS — M9902 Segmental and somatic dysfunction of thoracic region: Secondary | ICD-10-CM | POA: Diagnosis not present

## 2024-10-28 ENCOUNTER — Other Ambulatory Visit: Payer: Self-pay | Admitting: Cardiovascular Disease

## 2024-10-28 DIAGNOSIS — I10 Essential (primary) hypertension: Secondary | ICD-10-CM

## 2024-11-21 ENCOUNTER — Other Ambulatory Visit: Payer: Self-pay | Admitting: Cardiovascular Disease

## 2024-11-21 DIAGNOSIS — I10 Essential (primary) hypertension: Secondary | ICD-10-CM

## 2024-11-26 ENCOUNTER — Other Ambulatory Visit: Payer: Self-pay | Admitting: Internal Medicine

## 2024-11-30 DIAGNOSIS — M9903 Segmental and somatic dysfunction of lumbar region: Secondary | ICD-10-CM | POA: Diagnosis not present

## 2024-11-30 DIAGNOSIS — M531 Cervicobrachial syndrome: Secondary | ICD-10-CM | POA: Diagnosis not present

## 2024-11-30 DIAGNOSIS — M9901 Segmental and somatic dysfunction of cervical region: Secondary | ICD-10-CM | POA: Diagnosis not present

## 2024-11-30 DIAGNOSIS — M545 Low back pain, unspecified: Secondary | ICD-10-CM | POA: Diagnosis not present

## 2024-11-30 DIAGNOSIS — M9902 Segmental and somatic dysfunction of thoracic region: Secondary | ICD-10-CM | POA: Diagnosis not present

## 2024-11-30 DIAGNOSIS — M546 Pain in thoracic spine: Secondary | ICD-10-CM | POA: Diagnosis not present

## 2024-12-01 ENCOUNTER — Other Ambulatory Visit: Payer: Self-pay | Admitting: Cardiovascular Disease

## 2024-12-01 DIAGNOSIS — I10 Essential (primary) hypertension: Secondary | ICD-10-CM

## 2024-12-05 ENCOUNTER — Other Ambulatory Visit: Payer: Self-pay | Admitting: Cardiovascular Disease

## 2024-12-06 NOTE — Telephone Encounter (Signed)
 Left voice mail

## 2024-12-06 NOTE — Telephone Encounter (Signed)
Please contact pt for future appointment. 

## 2024-12-08 ENCOUNTER — Ambulatory Visit: Admitting: Internal Medicine

## 2024-12-10 ENCOUNTER — Encounter: Payer: Self-pay | Admitting: Internal Medicine

## 2024-12-10 ENCOUNTER — Ambulatory Visit: Admitting: Internal Medicine

## 2024-12-10 VITALS — BP 126/68 | HR 54 | Ht 71.0 in | Wt 205.4 lb

## 2024-12-10 DIAGNOSIS — K760 Fatty (change of) liver, not elsewhere classified: Secondary | ICD-10-CM | POA: Diagnosis not present

## 2024-12-10 DIAGNOSIS — R7401 Elevation of levels of liver transaminase levels: Secondary | ICD-10-CM | POA: Diagnosis not present

## 2024-12-10 DIAGNOSIS — Z7984 Long term (current) use of oral hypoglycemic drugs: Secondary | ICD-10-CM | POA: Diagnosis not present

## 2024-12-10 DIAGNOSIS — E1169 Type 2 diabetes mellitus with other specified complication: Secondary | ICD-10-CM

## 2024-12-10 DIAGNOSIS — E785 Hyperlipidemia, unspecified: Secondary | ICD-10-CM | POA: Diagnosis not present

## 2024-12-10 DIAGNOSIS — I700 Atherosclerosis of aorta: Secondary | ICD-10-CM | POA: Diagnosis not present

## 2024-12-10 DIAGNOSIS — M5126 Other intervertebral disc displacement, lumbar region: Secondary | ICD-10-CM

## 2024-12-10 DIAGNOSIS — I10 Essential (primary) hypertension: Secondary | ICD-10-CM

## 2024-12-10 DIAGNOSIS — Z23 Encounter for immunization: Secondary | ICD-10-CM

## 2024-12-10 DIAGNOSIS — E119 Type 2 diabetes mellitus without complications: Secondary | ICD-10-CM

## 2024-12-10 DIAGNOSIS — R5383 Other fatigue: Secondary | ICD-10-CM | POA: Diagnosis not present

## 2024-12-10 LAB — CBC WITH DIFFERENTIAL/PLATELET
Basophils Absolute: 0 K/uL (ref 0.0–0.1)
Basophils Relative: 0.5 % (ref 0.0–3.0)
Eosinophils Absolute: 0.1 K/uL (ref 0.0–0.7)
Eosinophils Relative: 2.9 % (ref 0.0–5.0)
HCT: 47.5 % (ref 39.0–52.0)
Hemoglobin: 16.1 g/dL (ref 13.0–17.0)
Lymphocytes Relative: 32.3 % (ref 12.0–46.0)
Lymphs Abs: 1.6 K/uL (ref 0.7–4.0)
MCHC: 33.9 g/dL (ref 30.0–36.0)
MCV: 96.2 fl (ref 78.0–100.0)
Monocytes Absolute: 0.4 K/uL (ref 0.1–1.0)
Monocytes Relative: 8.3 % (ref 3.0–12.0)
Neutro Abs: 2.8 K/uL (ref 1.4–7.7)
Neutrophils Relative %: 56 % (ref 43.0–77.0)
Platelets: 164 K/uL (ref 150.0–400.0)
RBC: 4.93 Mil/uL (ref 4.22–5.81)
RDW: 14.1 % (ref 11.5–15.5)
WBC: 5 K/uL (ref 4.0–10.5)

## 2024-12-10 LAB — COMPREHENSIVE METABOLIC PANEL WITH GFR
ALT: 28 U/L (ref 3–53)
AST: 25 U/L (ref 5–37)
Albumin: 4.4 g/dL (ref 3.5–5.2)
Alkaline Phosphatase: 55 U/L (ref 39–117)
BUN: 25 mg/dL — ABNORMAL HIGH (ref 6–23)
CO2: 30 meq/L (ref 19–32)
Calcium: 9.3 mg/dL (ref 8.4–10.5)
Chloride: 103 meq/L (ref 96–112)
Creatinine, Ser: 1.13 mg/dL (ref 0.40–1.50)
GFR: 61.23 mL/min
Glucose, Bld: 102 mg/dL — ABNORMAL HIGH (ref 70–99)
Potassium: 4.7 meq/L (ref 3.5–5.1)
Sodium: 140 meq/L (ref 135–145)
Total Bilirubin: 0.9 mg/dL (ref 0.2–1.2)
Total Protein: 6.6 g/dL (ref 6.0–8.3)

## 2024-12-10 LAB — LIPID PANEL
Cholesterol: 103 mg/dL (ref 28–200)
HDL: 39 mg/dL — ABNORMAL LOW
LDL Cholesterol: 55 mg/dL (ref 10–99)
NonHDL: 64.14
Total CHOL/HDL Ratio: 3
Triglycerides: 46 mg/dL (ref 10.0–149.0)
VLDL: 9.2 mg/dL (ref 0.0–40.0)

## 2024-12-10 LAB — LDL CHOLESTEROL, DIRECT: Direct LDL: 59 mg/dL

## 2024-12-10 LAB — HEMOGLOBIN A1C: Hgb A1c MFr Bld: 6.8 % — ABNORMAL HIGH (ref 4.6–6.5)

## 2024-12-10 LAB — TSH: TSH: 2.81 u[IU]/mL (ref 0.35–5.50)

## 2024-12-10 MED ORDER — GLIPIZIDE 10 MG PO TABS: ORAL_TABLET | ORAL | 1 refills | Status: AC

## 2024-12-10 MED ORDER — EMPAGLIFLOZIN 10 MG PO TABS: 10.0000 mg | ORAL_TABLET | Freq: Every day | ORAL | 1 refills | Status: AC

## 2024-12-10 NOTE — Patient Instructions (Addendum)
 Exercise lowers your blood sugars  Make sure you eat a snack during prolonged days of yard work  You do not need a daily aspirin .  You can take 81 mg once or twice  a week and still benefit from it

## 2024-12-10 NOTE — Assessment & Plan Note (Signed)
 Noted on prior ultrasound.  Repeat labs needed for assessing risk for cirrhosis.   Fibrosis 4 Score = 2.24 Score is based on outdated labs. ALT, AST, and platelets should all be measured within the last 6 months for an accurate FIB-4 Score  Fib-4 interpretation is not validated for people under 35 or over 81 years of age. However, scores under 2.0 are generally considered low risk.

## 2024-12-10 NOTE — Assessment & Plan Note (Signed)
 He has resumed statin , which was stopped (presumedly bc of liver enzyme elevation ).  Adding 81 mg asa 2/week

## 2024-12-10 NOTE — Assessment & Plan Note (Signed)
 HE IS RECEIVING CHIROPRACTIC manipulation by Lorrene Lopes  every 5-6 weeks which is preventing an exacerbation

## 2024-12-10 NOTE — Assessment & Plan Note (Signed)
 Well controlled on current regimen of hctz and amlodipine  was stopped  .He is ACE/ARB intolerant.

## 2024-12-10 NOTE — Assessment & Plan Note (Signed)
"   Managed with Jardiance  25 mg daily and glipizide  to 10 mg bid .  He stopped metformin  due to a prolonged period of abdominal pain .  Repeat A1c is pending .he inquired about daily asirin usage;  he has no known history of CAD  but as mild aortic atheroscloriss by prior imaging (per Dr Gollan).  Advised to take 81 mg aspir in 2/week  Lab Results  Component Value Date   HGBA1C 6.9 (H) 06/02/2024    "

## 2024-12-10 NOTE — Assessment & Plan Note (Signed)
 Noted during treatment with Paxlovid . Prior imaging studies suggestive of hepatic steatosis .  Repeat liver enzymes have normalized Lab Results  Component Value Date   ALT 34 06/02/2024   AST 26 06/02/2024   ALKPHOS 49 06/02/2024   BILITOT 0.9 06/02/2024

## 2024-12-10 NOTE — Progress Notes (Signed)
 "  Subjective:  Patient ID: Richard Hardy, male    DOB: [birth year]  Age: 79 y.o. MRN: [id number]  CC: The primary encounter diagnosis was Primary hypertension. 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   HPI Richard Hardy presents for  Chief Complaint  Patient presents with   Medical Management of Chronic Issues    6 month follow up    1)  type 2 DM/obesity/HTN: He feels generally well, is exercising several times per week and checking blood sugars once daily at variable times.  BS have been under 130 fasting and have rarely  dropped to 81 with prolonged outdoor activities, accompanied by  hypoglyemic events.  Taking Jardiance  and glipizide   as directed. Following a carbohydrate modified diet 6 days per week. Denies numbness, burning and tingling of extremities. Appetite is good.     has lost weight   through dietary modification and BMI is now < 30  2) YUW:Ybezmuzwdpnw: patient checks blood pressure twice weekly at home.  Readings have been for the most part <130/80 at rest . Patient is following a reduced salt diet most days and is taking medications as prescribed (hydrochlorothiazide )     Outpatient Medications Prior to Visit  Medication Sig Dispense Refill   atorvastatin  (LIPITOR) 20 MG tablet TAKE 1 TABLET BY MOUTH EVERY DAY 90 tablet 0   Blood Glucose Monitoring Suppl (ONETOUCH VERIO IQ SYSTEM) w/Device KIT Use to check blood sugars once daily. 1 kit 0   Coenzyme Q10 (COQ10) 200 MG CAPS Take 1 tablet by mouth daily.     Fish Oil OIL Take by mouth daily.     fluticasone (FLONASE) 50 MCG/ACT nasal spray Place into the nose at bedtime.     glucosamine-chondroitin 500-400 MG tablet Take 1 tablet by mouth in the morning and at  bedtime.     hydrochlorothiazide  (HYDRODIURIL ) 25 MG tablet TAKE 1 TABLET (25 MG TOTAL) BY MOUTH DAILY. 15 tablet 0   Multiple Vitamin (MULTIVITAMIN) capsule Take 1 capsule by mouth daily.     ONETOUCH DELICA LANCETS 33G MISC Use to check blood sugars once daily. 100 each 1   ONETOUCH VERIO test strip USE TO CHECK SUGARS ONCE OR TWICE DAILY . 100 strip 1   Turmeric 500 MG TABS Take 1 tablet by mouth daily.     vitamin C (ASCORBIC ACID) 500 MG tablet Take 500 mg by mouth daily.     empagliflozin  (JARDIANCE ) 10 MG TABS tablet Take 1 tablet (10 mg total) by mouth daily before breakfast. 90 tablet 1   glipiZIDE  (GLUCOTROL ) 10 MG tablet TAKE 1 TABLET (10 MG TOTAL) BY MOUTH TWICE A DAY BEFORE A MEAL 180 tablet 1   No facility-administered medications prior to visit.    Review of Systems;  Patient denies headache, fevers, malaise, unintentional weight loss, skin rash, eye pain, sinus congestion and sinus pain, sore throat, dysphagia,  hemoptysis , cough, dyspnea, wheezing, chest pain, palpitations, orthopnea, edema, abdominal pain, nausea, melena, diarrhea, constipation, flank pain, dysuria, hematuria, urinary  Frequency, nocturia, numbness, tingling, seizures,  Focal weakness, Loss of consciousness,  Tremor, insomnia, depression, anxiety, and suicidal ideation.      Objective:  BP 126/68   Pulse (!) 54   Ht 5' 11 (1.803 m)   Wt 205 lb 6.4 oz (  93.2 kg)   SpO2 98%   BMI 28.65 kg/m   BP Readings from Last 3 Encounters:  12/10/24 126/68  06/02/24 130/72  12/15/23 (!) 144/82    Wt Readings from Last 3 Encounters:  12/10/24 205 lb 6.4 oz (93.2 kg)  06/02/24 207 lb 6.4 oz (94.1 kg)  01/27/24 211 lb (95.7 kg)    Physical Exam Vitals reviewed.  Constitutional:      General: He is not in acute distress.    Appearance: Normal appearance. He is normal weight. He is not ill-appearing, toxic-appearing or diaphoretic.  HENT:     Head: Normocephalic.  Eyes:     General: No scleral icterus.        Right eye: No discharge.        Left eye: No discharge.     Conjunctiva/sclera: Conjunctivae normal.  Cardiovascular:     Rate and Rhythm: Normal rate and regular rhythm.     Heart sounds: Normal heart sounds.  Pulmonary:     Effort: Pulmonary effort is normal. No respiratory distress.     Breath sounds: Normal breath sounds.  Musculoskeletal:        General: Normal range of motion.     Cervical back: Normal range of motion.  Skin:    General: Skin is warm and dry.  Neurological:     General: No focal deficit present.     Mental Status: He is alert and oriented to person, place, and time. Mental status is at baseline.  Psychiatric:        Mood and Affect: Mood normal.        Behavior: Behavior normal.        Thought Content: Thought content normal.        Judgment: Judgment normal.     Lab Results  Component Value Date   HGBA1C 6.9 (H) 06/02/2024   HGBA1C 7.5 (H) 11/17/2023   HGBA1C 7.3 (H) 05/14/2023    Lab Results  Component Value Date   CREATININE 1.18 06/02/2024   CREATININE 1.04 11/17/2023   CREATININE 0.94 05/14/2023    Lab Results  Component Value Date   WBC 5.0 11/17/2023   HGB 15.9 11/17/2023   HCT 48.0 11/17/2023   PLT 159.0 11/17/2023   GLUCOSE 115 (H) 06/02/2024   CHOL 98 06/02/2024   TRIG 46.0 06/02/2024   HDL 40.30 06/02/2024   LDLDIRECT 51.0 06/02/2024   LDLCALC 48 06/02/2024   ALT 34 06/02/2024   AST 26 06/02/2024   NA 141 06/02/2024   K 4.2 06/02/2024   CL 104 06/02/2024   CREATININE 1.18 06/02/2024   BUN 25 (H) 06/02/2024   CO2 29 06/02/2024   TSH 2.81 11/17/2023   PSA 0.32 11/10/2019   HGBA1C 6.9 (H) 06/02/2024   MICROALBUR 2.3 (H) 06/02/2024    No results found.  Assessment & Plan:  .Primary hypertension Assessment & Plan: Well controlled on current regimen of hctz and amlodipine  was stopped  .He is ACE/ARB intolerant.   Orders: -     Comprehensive metabolic panel with GFR  Hyperlipidemia due to type 2 diabetes  mellitus (HCC) -     Lipid panel -     LDL cholesterol, direct  Diabetes type 2, controlled (HCC) Assessment & Plan:  Managed with Jardiance  25 mg daily and glipizide  to 10 mg bid .  He stopped metformin  due to a prolonged period of abdominal pain .  Repeat A1c is pending .he inquired about daily asirin usage;  he has no  known history of CAD  but as mild aortic atheroscloriss by prior imaging (per Dr Gollan).  Advised to take 81 mg aspir in 2/week  Lab Results  Component Value Date   HGBA1C 6.9 (H) 06/02/2024     Orders: -     Comprehensive metabolic panel with GFR -     Hemoglobin A1c -     Hemoglobin A1c; Future -     Comprehensive metabolic panel with GFR; Future -     Microalbumin / creatinine urine ratio; Future  Other fatigue -     TSH -     CBC with Differential/Platelet  Need for influenza vaccination -     Flu vaccine HIGH DOSE PF(Fluzone Trivalent)  Transaminitis Assessment & Plan: Noted during treatment with Paxlovid . Prior imaging studies suggestive of hepatic steatosis .  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  Adding 81 mg asa 2/week   Hepatic steatosis Assessment & Plan: Noted on prior ultrasound.  Repeat labs needed for assessing risk for cirrhosis.   Fibrosis 4 Score = 2.24 Score is based on outdated labs. ALT, AST, and platelets should all be measured within the last 6 months for an accurate FIB-4 Score  Fib-4 interpretation is not validated for people under 35 or over 71 years of age. However, scores  under 2.0 are generally considered low risk.    Other orders -     Empagliflozin ; Take 1 tablet (10 mg total) by mouth daily before breakfast.  Dispense: 90 tablet; Refill: 1 -     glipiZIDE ; TAKE 1 TABLET (10 MG TOTAL) BY MOUTH TWICE A DAY BEFORE A MEAL  Dispense: 180 tablet; Refill: 1    I personally spent a total of 30 minutes in the care of the patient today including preparing to see the patient, getting/reviewing separately obtained history, performing a medically appropriate exam/evaluation, counseling and educating, documenting clinical information in the EHR, independently interpreting results, and communicating results.  Follow-up: Return in about 6 months (around 06/09/2025) for follow up diabetes.   Verneita LITTIE Kettering, MD "

## 2024-12-12 ENCOUNTER — Ambulatory Visit: Payer: Self-pay | Admitting: Internal Medicine

## 2025-01-28 ENCOUNTER — Ambulatory Visit: Payer: Medicare HMO

## 2025-02-08 ENCOUNTER — Ambulatory Visit: Admitting: Cardiovascular Disease

## 2025-03-23 ENCOUNTER — Ambulatory Visit: Admitting: Dermatology

## 2025-06-15 ENCOUNTER — Ambulatory Visit: Admitting: Internal Medicine
